# Patient Record
Sex: Male | Born: 1940 | Race: Black or African American | Hispanic: No | Marital: Married | Smoking: Former smoker
Health system: Southern US, Community
[De-identification: ages and names within clinical notes are randomized; demographics above are authoritative.]

## PROBLEM LIST (undated history)

## (undated) DIAGNOSIS — K219 Gastro-esophageal reflux disease without esophagitis: Secondary | ICD-10-CM

## (undated) DIAGNOSIS — I5041 Acute combined systolic (congestive) and diastolic (congestive) heart failure: Secondary | ICD-10-CM

## (undated) DIAGNOSIS — R55 Syncope and collapse: Secondary | ICD-10-CM

## (undated) DIAGNOSIS — E876 Hypokalemia: Secondary | ICD-10-CM

## (undated) DIAGNOSIS — I10 Essential (primary) hypertension: Secondary | ICD-10-CM

## (undated) DIAGNOSIS — N400 Enlarged prostate without lower urinary tract symptoms: Secondary | ICD-10-CM

## (undated) DIAGNOSIS — I429 Cardiomyopathy, unspecified: Secondary | ICD-10-CM

## (undated) DIAGNOSIS — I639 Cerebral infarction, unspecified: Secondary | ICD-10-CM

## (undated) DIAGNOSIS — I2699 Other pulmonary embolism without acute cor pulmonale: Secondary | ICD-10-CM

## (undated) DIAGNOSIS — M109 Gout, unspecified: Secondary | ICD-10-CM

## (undated) HISTORY — PX: HAND SURGERY: SHX662

---

## 2011-10-05 ENCOUNTER — Ambulatory Visit (INDEPENDENT_AMBULATORY_CARE_PROVIDER_SITE_OTHER): Payer: Medicare HMO | Admitting: *Deleted

## 2011-10-05 DIAGNOSIS — I779 Disorder of arteries and arterioles, unspecified: Secondary | ICD-10-CM

## 2011-10-05 DIAGNOSIS — N183 Chronic kidney disease, stage 3 unspecified: Secondary | ICD-10-CM

## 2011-10-09 NOTE — Procedures (Unsigned)
RENAL ARTERY DUPLEX EVALUATION  INDICATION:  Chronic kidney disease.  HISTORY: Increased creatinine. Diabetes:  No. Cardiac:  No. Hypertension:  No. Smoking:  Previous.  RENAL ARTERY DUPLEX FINDINGS: Aorta-Proximal:  66 cm/s Aorta-Mid:  62 cm/s Aorta-Distal:  64 cm/s Celiac Artery Origin:  117 cm/s SMA Origin:  97 cm/s                                   RIGHT               LEFT Renal Artery Origin:             80/24 cm/s          45/4 cm/s Renal Artery Proximal:           81/22 cm/s          67/22 cm/s Renal Artery Mid:                67/24 cm/s          83/26 cm/s Renal Artery Distal:             73/31 cm/s          52/19 cm/s Hilar Acceleration Time (AT): Renal-Aortic Ratio (RAR):        1.22                1.25 Kidney Size:                     10.1                [id number] End Diastolic Ratio (EDR): Resistive Index (RI):  IMPRESSION: 1. Visualized velocities in the bilateral renal arteries appear within     normal range. 2. The renal artery ratio is within normal limits bilaterally. 3. This was somewhat of a technically difficult exam due to overlying     bowel gas.  ___________________________________________ Rosetta Posner, M.D.  SS/MEDQ  D:  10/05/2011  T:  10/05/2011  Job:  [id number]

## 2015-04-04 DIAGNOSIS — I10 Essential (primary) hypertension: Secondary | ICD-10-CM | POA: Diagnosis not present

## 2015-04-04 DIAGNOSIS — Z6822 Body mass index (BMI) 22.0-22.9, adult: Secondary | ICD-10-CM | POA: Diagnosis not present

## 2015-04-04 DIAGNOSIS — Z1389 Encounter for screening for other disorder: Secondary | ICD-10-CM | POA: Diagnosis not present

## 2015-04-04 DIAGNOSIS — K219 Gastro-esophageal reflux disease without esophagitis: Secondary | ICD-10-CM | POA: Diagnosis not present

## 2015-04-04 DIAGNOSIS — N390 Urinary tract infection, site not specified: Secondary | ICD-10-CM | POA: Diagnosis not present

## 2015-04-04 DIAGNOSIS — N184 Chronic kidney disease, stage 4 (severe): Secondary | ICD-10-CM | POA: Diagnosis not present

## 2015-04-04 DIAGNOSIS — E785 Hyperlipidemia, unspecified: Secondary | ICD-10-CM | POA: Diagnosis not present

## 2015-04-04 DIAGNOSIS — Z79899 Other long term (current) drug therapy: Secondary | ICD-10-CM | POA: Diagnosis not present

## 2015-04-04 DIAGNOSIS — M109 Gout, unspecified: Secondary | ICD-10-CM | POA: Diagnosis not present

## 2015-06-16 DIAGNOSIS — R351 Nocturia: Secondary | ICD-10-CM | POA: Diagnosis not present

## 2015-06-16 DIAGNOSIS — R972 Elevated prostate specific antigen [PSA]: Secondary | ICD-10-CM | POA: Diagnosis not present

## 2015-06-16 DIAGNOSIS — N401 Enlarged prostate with lower urinary tract symptoms: Secondary | ICD-10-CM | POA: Diagnosis not present

## 2015-06-16 DIAGNOSIS — N3020 Other chronic cystitis without hematuria: Secondary | ICD-10-CM | POA: Diagnosis not present

## 2015-06-16 DIAGNOSIS — N3090 Cystitis, unspecified without hematuria: Secondary | ICD-10-CM | POA: Diagnosis not present

## 2015-06-16 DIAGNOSIS — N318 Other neuromuscular dysfunction of bladder: Secondary | ICD-10-CM | POA: Diagnosis not present

## 2015-08-10 DIAGNOSIS — I10 Essential (primary) hypertension: Secondary | ICD-10-CM | POA: Diagnosis not present

## 2015-08-10 DIAGNOSIS — R300 Dysuria: Secondary | ICD-10-CM | POA: Diagnosis not present

## 2015-08-10 DIAGNOSIS — Z681 Body mass index (BMI) 19 or less, adult: Secondary | ICD-10-CM | POA: Diagnosis not present

## 2015-08-10 DIAGNOSIS — Z79899 Other long term (current) drug therapy: Secondary | ICD-10-CM | POA: Diagnosis not present

## 2015-08-10 DIAGNOSIS — E785 Hyperlipidemia, unspecified: Secondary | ICD-10-CM | POA: Diagnosis not present

## 2015-08-10 DIAGNOSIS — R972 Elevated prostate specific antigen [PSA]: Secondary | ICD-10-CM | POA: Diagnosis not present

## 2015-08-10 DIAGNOSIS — K219 Gastro-esophageal reflux disease without esophagitis: Secondary | ICD-10-CM | POA: Diagnosis not present

## 2015-08-10 DIAGNOSIS — N184 Chronic kidney disease, stage 4 (severe): Secondary | ICD-10-CM | POA: Diagnosis not present

## 2015-10-11 DIAGNOSIS — Z9181 History of falling: Secondary | ICD-10-CM | POA: Diagnosis not present

## 2015-10-11 DIAGNOSIS — M109 Gout, unspecified: Secondary | ICD-10-CM | POA: Diagnosis not present

## 2015-10-11 DIAGNOSIS — I10 Essential (primary) hypertension: Secondary | ICD-10-CM | POA: Diagnosis not present

## 2015-11-03 DIAGNOSIS — N3090 Cystitis, unspecified without hematuria: Secondary | ICD-10-CM | POA: Diagnosis not present

## 2015-11-03 DIAGNOSIS — N318 Other neuromuscular dysfunction of bladder: Secondary | ICD-10-CM | POA: Diagnosis not present

## 2015-11-03 DIAGNOSIS — R972 Elevated prostate specific antigen [PSA]: Secondary | ICD-10-CM | POA: Diagnosis not present

## 2015-11-03 DIAGNOSIS — N401 Enlarged prostate with lower urinary tract symptoms: Secondary | ICD-10-CM | POA: Diagnosis not present

## 2015-11-03 DIAGNOSIS — N3020 Other chronic cystitis without hematuria: Secondary | ICD-10-CM | POA: Diagnosis not present

## 2015-11-17 DIAGNOSIS — N529 Male erectile dysfunction, unspecified: Secondary | ICD-10-CM | POA: Diagnosis not present

## 2015-11-17 DIAGNOSIS — N3020 Other chronic cystitis without hematuria: Secondary | ICD-10-CM | POA: Diagnosis not present

## 2015-11-17 DIAGNOSIS — R972 Elevated prostate specific antigen [PSA]: Secondary | ICD-10-CM | POA: Diagnosis not present

## 2015-11-17 DIAGNOSIS — N403 Nodular prostate with lower urinary tract symptoms: Secondary | ICD-10-CM | POA: Diagnosis not present

## 2016-01-18 DIAGNOSIS — M109 Gout, unspecified: Secondary | ICD-10-CM | POA: Diagnosis not present

## 2016-01-18 DIAGNOSIS — Z79899 Other long term (current) drug therapy: Secondary | ICD-10-CM | POA: Diagnosis not present

## 2016-01-18 DIAGNOSIS — I10 Essential (primary) hypertension: Secondary | ICD-10-CM | POA: Diagnosis not present

## 2016-01-18 DIAGNOSIS — K219 Gastro-esophageal reflux disease without esophagitis: Secondary | ICD-10-CM | POA: Diagnosis not present

## 2016-01-18 DIAGNOSIS — E785 Hyperlipidemia, unspecified: Secondary | ICD-10-CM | POA: Diagnosis not present

## 2016-01-18 DIAGNOSIS — N184 Chronic kidney disease, stage 4 (severe): Secondary | ICD-10-CM | POA: Diagnosis not present

## 2016-02-16 DIAGNOSIS — R972 Elevated prostate specific antigen [PSA]: Secondary | ICD-10-CM | POA: Diagnosis not present

## 2016-02-16 DIAGNOSIS — N318 Other neuromuscular dysfunction of bladder: Secondary | ICD-10-CM | POA: Diagnosis not present

## 2016-02-16 DIAGNOSIS — N411 Chronic prostatitis: Secondary | ICD-10-CM | POA: Diagnosis not present

## 2016-02-16 DIAGNOSIS — N3020 Other chronic cystitis without hematuria: Secondary | ICD-10-CM | POA: Diagnosis not present

## 2016-05-21 DIAGNOSIS — K219 Gastro-esophageal reflux disease without esophagitis: Secondary | ICD-10-CM | POA: Diagnosis not present

## 2016-05-21 DIAGNOSIS — Z2821 Immunization not carried out because of patient refusal: Secondary | ICD-10-CM | POA: Diagnosis not present

## 2016-05-21 DIAGNOSIS — E785 Hyperlipidemia, unspecified: Secondary | ICD-10-CM | POA: Diagnosis not present

## 2016-05-21 DIAGNOSIS — M109 Gout, unspecified: Secondary | ICD-10-CM | POA: Diagnosis not present

## 2016-05-21 DIAGNOSIS — Z6825 Body mass index (BMI) 25.0-25.9, adult: Secondary | ICD-10-CM | POA: Diagnosis not present

## 2016-05-21 DIAGNOSIS — I10 Essential (primary) hypertension: Secondary | ICD-10-CM | POA: Diagnosis not present

## 2016-05-21 DIAGNOSIS — N184 Chronic kidney disease, stage 4 (severe): Secondary | ICD-10-CM | POA: Diagnosis not present

## 2016-07-20 DIAGNOSIS — H2513 Age-related nuclear cataract, bilateral: Secondary | ICD-10-CM | POA: Diagnosis not present

## 2016-09-18 DIAGNOSIS — E785 Hyperlipidemia, unspecified: Secondary | ICD-10-CM | POA: Diagnosis not present

## 2016-09-18 DIAGNOSIS — K219 Gastro-esophageal reflux disease without esophagitis: Secondary | ICD-10-CM | POA: Diagnosis not present

## 2016-09-18 DIAGNOSIS — Z79899 Other long term (current) drug therapy: Secondary | ICD-10-CM | POA: Diagnosis not present

## 2016-09-18 DIAGNOSIS — Z6825 Body mass index (BMI) 25.0-25.9, adult: Secondary | ICD-10-CM | POA: Diagnosis not present

## 2016-09-18 DIAGNOSIS — I10 Essential (primary) hypertension: Secondary | ICD-10-CM | POA: Diagnosis not present

## 2016-09-18 DIAGNOSIS — N184 Chronic kidney disease, stage 4 (severe): Secondary | ICD-10-CM | POA: Diagnosis not present

## 2016-09-18 DIAGNOSIS — M109 Gout, unspecified: Secondary | ICD-10-CM | POA: Diagnosis not present

## 2016-10-09 DIAGNOSIS — H401133 Primary open-angle glaucoma, bilateral, severe stage: Secondary | ICD-10-CM | POA: Diagnosis not present

## 2016-10-09 DIAGNOSIS — H25812 Combined forms of age-related cataract, left eye: Secondary | ICD-10-CM | POA: Diagnosis not present

## 2016-10-10 DIAGNOSIS — H2512 Age-related nuclear cataract, left eye: Secondary | ICD-10-CM | POA: Diagnosis not present

## 2016-10-10 DIAGNOSIS — H25812 Combined forms of age-related cataract, left eye: Secondary | ICD-10-CM | POA: Diagnosis not present

## 2016-12-25 DIAGNOSIS — N3020 Other chronic cystitis without hematuria: Secondary | ICD-10-CM | POA: Diagnosis not present

## 2016-12-25 DIAGNOSIS — N400 Enlarged prostate without lower urinary tract symptoms: Secondary | ICD-10-CM | POA: Diagnosis not present

## 2016-12-25 DIAGNOSIS — R300 Dysuria: Secondary | ICD-10-CM | POA: Diagnosis not present

## 2016-12-25 DIAGNOSIS — E291 Testicular hypofunction: Secondary | ICD-10-CM | POA: Diagnosis not present

## 2016-12-25 DIAGNOSIS — N538 Other male sexual dysfunction: Secondary | ICD-10-CM | POA: Diagnosis not present

## 2016-12-25 DIAGNOSIS — R972 Elevated prostate specific antigen [PSA]: Secondary | ICD-10-CM | POA: Diagnosis not present

## 2017-01-28 DIAGNOSIS — Z79899 Other long term (current) drug therapy: Secondary | ICD-10-CM | POA: Diagnosis not present

## 2017-01-28 DIAGNOSIS — Z1389 Encounter for screening for other disorder: Secondary | ICD-10-CM | POA: Diagnosis not present

## 2017-01-28 DIAGNOSIS — Z9181 History of falling: Secondary | ICD-10-CM | POA: Diagnosis not present

## 2017-01-28 DIAGNOSIS — K219 Gastro-esophageal reflux disease without esophagitis: Secondary | ICD-10-CM | POA: Diagnosis not present

## 2017-01-28 DIAGNOSIS — Z6826 Body mass index (BMI) 26.0-26.9, adult: Secondary | ICD-10-CM | POA: Diagnosis not present

## 2017-01-28 DIAGNOSIS — M109 Gout, unspecified: Secondary | ICD-10-CM | POA: Diagnosis not present

## 2017-01-28 DIAGNOSIS — N184 Chronic kidney disease, stage 4 (severe): Secondary | ICD-10-CM | POA: Diagnosis not present

## 2017-01-28 DIAGNOSIS — E785 Hyperlipidemia, unspecified: Secondary | ICD-10-CM | POA: Diagnosis not present

## 2017-01-28 DIAGNOSIS — I10 Essential (primary) hypertension: Secondary | ICD-10-CM | POA: Diagnosis not present

## 2017-03-05 DIAGNOSIS — N318 Other neuromuscular dysfunction of bladder: Secondary | ICD-10-CM | POA: Diagnosis not present

## 2017-03-05 DIAGNOSIS — N3020 Other chronic cystitis without hematuria: Secondary | ICD-10-CM | POA: Diagnosis not present

## 2017-03-05 DIAGNOSIS — N538 Other male sexual dysfunction: Secondary | ICD-10-CM | POA: Diagnosis not present

## 2017-03-05 DIAGNOSIS — N400 Enlarged prostate without lower urinary tract symptoms: Secondary | ICD-10-CM | POA: Diagnosis not present

## 2017-05-27 DIAGNOSIS — N400 Enlarged prostate without lower urinary tract symptoms: Secondary | ICD-10-CM | POA: Diagnosis not present

## 2017-05-27 DIAGNOSIS — N3020 Other chronic cystitis without hematuria: Secondary | ICD-10-CM | POA: Diagnosis not present

## 2017-05-27 DIAGNOSIS — N318 Other neuromuscular dysfunction of bladder: Secondary | ICD-10-CM | POA: Diagnosis not present

## 2017-05-27 DIAGNOSIS — N401 Enlarged prostate with lower urinary tract symptoms: Secondary | ICD-10-CM | POA: Diagnosis not present

## 2017-05-27 DIAGNOSIS — E291 Testicular hypofunction: Secondary | ICD-10-CM | POA: Diagnosis not present

## 2017-06-03 DIAGNOSIS — Z9181 History of falling: Secondary | ICD-10-CM | POA: Diagnosis not present

## 2017-06-03 DIAGNOSIS — I10 Essential (primary) hypertension: Secondary | ICD-10-CM | POA: Diagnosis not present

## 2017-06-03 DIAGNOSIS — M109 Gout, unspecified: Secondary | ICD-10-CM | POA: Diagnosis not present

## 2017-06-03 DIAGNOSIS — E663 Overweight: Secondary | ICD-10-CM | POA: Diagnosis not present

## 2017-06-03 DIAGNOSIS — N184 Chronic kidney disease, stage 4 (severe): Secondary | ICD-10-CM | POA: Diagnosis not present

## 2017-06-03 DIAGNOSIS — K219 Gastro-esophageal reflux disease without esophagitis: Secondary | ICD-10-CM | POA: Diagnosis not present

## 2017-06-03 DIAGNOSIS — E785 Hyperlipidemia, unspecified: Secondary | ICD-10-CM | POA: Diagnosis not present

## 2017-06-03 DIAGNOSIS — Z1331 Encounter for screening for depression: Secondary | ICD-10-CM | POA: Diagnosis not present

## 2017-06-03 DIAGNOSIS — Z79899 Other long term (current) drug therapy: Secondary | ICD-10-CM | POA: Diagnosis not present

## 2017-06-04 DIAGNOSIS — N390 Urinary tract infection, site not specified: Secondary | ICD-10-CM | POA: Diagnosis not present

## 2017-06-04 DIAGNOSIS — Z79899 Other long term (current) drug therapy: Secondary | ICD-10-CM | POA: Diagnosis not present

## 2017-06-04 DIAGNOSIS — Z6827 Body mass index (BMI) 27.0-27.9, adult: Secondary | ICD-10-CM | POA: Diagnosis not present

## 2017-06-26 DIAGNOSIS — Z9181 History of falling: Secondary | ICD-10-CM | POA: Diagnosis not present

## 2017-06-26 DIAGNOSIS — Z125 Encounter for screening for malignant neoplasm of prostate: Secondary | ICD-10-CM | POA: Diagnosis not present

## 2017-06-26 DIAGNOSIS — Z0000 Encounter for general adult medical examination without abnormal findings: Secondary | ICD-10-CM | POA: Diagnosis not present

## 2017-06-26 DIAGNOSIS — E785 Hyperlipidemia, unspecified: Secondary | ICD-10-CM | POA: Diagnosis not present

## 2017-06-26 DIAGNOSIS — Z139 Encounter for screening, unspecified: Secondary | ICD-10-CM | POA: Diagnosis not present

## 2017-10-08 DIAGNOSIS — I10 Essential (primary) hypertension: Secondary | ICD-10-CM | POA: Diagnosis not present

## 2017-10-08 DIAGNOSIS — N184 Chronic kidney disease, stage 4 (severe): Secondary | ICD-10-CM | POA: Diagnosis not present

## 2017-10-08 DIAGNOSIS — N529 Male erectile dysfunction, unspecified: Secondary | ICD-10-CM | POA: Diagnosis not present

## 2017-10-08 DIAGNOSIS — Z6827 Body mass index (BMI) 27.0-27.9, adult: Secondary | ICD-10-CM | POA: Diagnosis not present

## 2017-10-08 DIAGNOSIS — E663 Overweight: Secondary | ICD-10-CM | POA: Diagnosis not present

## 2017-10-08 DIAGNOSIS — E785 Hyperlipidemia, unspecified: Secondary | ICD-10-CM | POA: Diagnosis not present

## 2017-10-08 DIAGNOSIS — Z79899 Other long term (current) drug therapy: Secondary | ICD-10-CM | POA: Diagnosis not present

## 2017-10-08 DIAGNOSIS — M109 Gout, unspecified: Secondary | ICD-10-CM | POA: Diagnosis not present

## 2017-10-08 DIAGNOSIS — K219 Gastro-esophageal reflux disease without esophagitis: Secondary | ICD-10-CM | POA: Diagnosis not present

## 2017-11-15 DIAGNOSIS — H2511 Age-related nuclear cataract, right eye: Secondary | ICD-10-CM | POA: Diagnosis not present

## 2017-11-15 DIAGNOSIS — H2702 Aphakia, left eye: Secondary | ICD-10-CM | POA: Diagnosis not present

## 2017-12-09 DIAGNOSIS — R972 Elevated prostate specific antigen [PSA]: Secondary | ICD-10-CM | POA: Diagnosis not present

## 2017-12-09 DIAGNOSIS — N538 Other male sexual dysfunction: Secondary | ICD-10-CM | POA: Diagnosis not present

## 2017-12-09 DIAGNOSIS — N3020 Other chronic cystitis without hematuria: Secondary | ICD-10-CM | POA: Diagnosis not present

## 2017-12-09 DIAGNOSIS — N401 Enlarged prostate with lower urinary tract symptoms: Secondary | ICD-10-CM | POA: Diagnosis not present

## 2018-01-24 DIAGNOSIS — J069 Acute upper respiratory infection, unspecified: Secondary | ICD-10-CM | POA: Diagnosis not present

## 2018-01-24 DIAGNOSIS — Z6826 Body mass index (BMI) 26.0-26.9, adult: Secondary | ICD-10-CM | POA: Diagnosis not present

## 2018-02-10 DIAGNOSIS — E785 Hyperlipidemia, unspecified: Secondary | ICD-10-CM | POA: Diagnosis not present

## 2018-02-10 DIAGNOSIS — K219 Gastro-esophageal reflux disease without esophagitis: Secondary | ICD-10-CM | POA: Diagnosis not present

## 2018-02-10 DIAGNOSIS — Z79899 Other long term (current) drug therapy: Secondary | ICD-10-CM | POA: Diagnosis not present

## 2018-02-10 DIAGNOSIS — Z6826 Body mass index (BMI) 26.0-26.9, adult: Secondary | ICD-10-CM | POA: Diagnosis not present

## 2018-02-10 DIAGNOSIS — M109 Gout, unspecified: Secondary | ICD-10-CM | POA: Diagnosis not present

## 2018-02-10 DIAGNOSIS — N400 Enlarged prostate without lower urinary tract symptoms: Secondary | ICD-10-CM | POA: Diagnosis not present

## 2018-02-10 DIAGNOSIS — N529 Male erectile dysfunction, unspecified: Secondary | ICD-10-CM | POA: Diagnosis not present

## 2018-02-10 DIAGNOSIS — N184 Chronic kidney disease, stage 4 (severe): Secondary | ICD-10-CM | POA: Diagnosis not present

## 2018-02-10 DIAGNOSIS — I10 Essential (primary) hypertension: Secondary | ICD-10-CM | POA: Diagnosis not present

## 2018-06-16 DIAGNOSIS — K219 Gastro-esophageal reflux disease without esophagitis: Secondary | ICD-10-CM | POA: Diagnosis not present

## 2018-06-16 DIAGNOSIS — E785 Hyperlipidemia, unspecified: Secondary | ICD-10-CM | POA: Diagnosis not present

## 2018-06-16 DIAGNOSIS — I10 Essential (primary) hypertension: Secondary | ICD-10-CM | POA: Diagnosis not present

## 2018-06-16 DIAGNOSIS — Z79899 Other long term (current) drug therapy: Secondary | ICD-10-CM | POA: Diagnosis not present

## 2018-06-16 DIAGNOSIS — N529 Male erectile dysfunction, unspecified: Secondary | ICD-10-CM | POA: Diagnosis not present

## 2018-06-16 DIAGNOSIS — E663 Overweight: Secondary | ICD-10-CM | POA: Diagnosis not present

## 2018-06-16 DIAGNOSIS — M109 Gout, unspecified: Secondary | ICD-10-CM | POA: Diagnosis not present

## 2018-06-16 DIAGNOSIS — N400 Enlarged prostate without lower urinary tract symptoms: Secondary | ICD-10-CM | POA: Diagnosis not present

## 2018-06-16 DIAGNOSIS — N184 Chronic kidney disease, stage 4 (severe): Secondary | ICD-10-CM | POA: Diagnosis not present

## 2018-07-22 DIAGNOSIS — N401 Enlarged prostate with lower urinary tract symptoms: Secondary | ICD-10-CM | POA: Diagnosis not present

## 2018-07-22 DIAGNOSIS — N3020 Other chronic cystitis without hematuria: Secondary | ICD-10-CM | POA: Diagnosis not present

## 2018-07-22 DIAGNOSIS — N318 Other neuromuscular dysfunction of bladder: Secondary | ICD-10-CM | POA: Diagnosis not present

## 2018-07-22 DIAGNOSIS — R972 Elevated prostate specific antigen [PSA]: Secondary | ICD-10-CM | POA: Diagnosis not present

## 2018-10-20 DIAGNOSIS — Z79899 Other long term (current) drug therapy: Secondary | ICD-10-CM | POA: Diagnosis not present

## 2018-10-20 DIAGNOSIS — Z9181 History of falling: Secondary | ICD-10-CM | POA: Diagnosis not present

## 2018-10-20 DIAGNOSIS — R300 Dysuria: Secondary | ICD-10-CM | POA: Diagnosis not present

## 2018-10-20 DIAGNOSIS — N184 Chronic kidney disease, stage 4 (severe): Secondary | ICD-10-CM | POA: Diagnosis not present

## 2018-10-20 DIAGNOSIS — I10 Essential (primary) hypertension: Secondary | ICD-10-CM | POA: Diagnosis not present

## 2018-10-20 DIAGNOSIS — K219 Gastro-esophageal reflux disease without esophagitis: Secondary | ICD-10-CM | POA: Diagnosis not present

## 2018-10-20 DIAGNOSIS — Z1331 Encounter for screening for depression: Secondary | ICD-10-CM | POA: Diagnosis not present

## 2018-10-20 DIAGNOSIS — M109 Gout, unspecified: Secondary | ICD-10-CM | POA: Diagnosis not present

## 2018-10-20 DIAGNOSIS — E785 Hyperlipidemia, unspecified: Secondary | ICD-10-CM | POA: Diagnosis not present

## 2018-11-27 DIAGNOSIS — Z79899 Other long term (current) drug therapy: Secondary | ICD-10-CM | POA: Diagnosis not present

## 2018-11-27 DIAGNOSIS — J069 Acute upper respiratory infection, unspecified: Secondary | ICD-10-CM | POA: Diagnosis not present

## 2018-11-27 DIAGNOSIS — Z6826 Body mass index (BMI) 26.0-26.9, adult: Secondary | ICD-10-CM | POA: Diagnosis not present

## 2018-11-27 DIAGNOSIS — R05 Cough: Secondary | ICD-10-CM | POA: Diagnosis not present

## 2018-12-07 DIAGNOSIS — J189 Pneumonia, unspecified organism: Secondary | ICD-10-CM | POA: Diagnosis not present

## 2018-12-07 DIAGNOSIS — I82412 Acute embolism and thrombosis of left femoral vein: Secondary | ICD-10-CM | POA: Diagnosis not present

## 2018-12-07 DIAGNOSIS — I959 Hypotension, unspecified: Secondary | ICD-10-CM | POA: Diagnosis not present

## 2018-12-07 DIAGNOSIS — R7989 Other specified abnormal findings of blood chemistry: Secondary | ICD-10-CM | POA: Diagnosis not present

## 2018-12-07 DIAGNOSIS — Z9181 History of falling: Secondary | ICD-10-CM | POA: Diagnosis not present

## 2018-12-07 DIAGNOSIS — R4020 Unspecified coma: Secondary | ICD-10-CM | POA: Diagnosis not present

## 2018-12-07 DIAGNOSIS — R569 Unspecified convulsions: Secondary | ICD-10-CM | POA: Diagnosis not present

## 2018-12-07 DIAGNOSIS — S0990XA Unspecified injury of head, initial encounter: Secondary | ICD-10-CM | POA: Diagnosis not present

## 2018-12-07 DIAGNOSIS — R404 Transient alteration of awareness: Secondary | ICD-10-CM | POA: Diagnosis not present

## 2018-12-07 DIAGNOSIS — R911 Solitary pulmonary nodule: Secondary | ICD-10-CM | POA: Diagnosis not present

## 2018-12-07 DIAGNOSIS — I10 Essential (primary) hypertension: Secondary | ICD-10-CM | POA: Diagnosis not present

## 2018-12-07 DIAGNOSIS — I429 Cardiomyopathy, unspecified: Secondary | ICD-10-CM | POA: Diagnosis not present

## 2018-12-07 DIAGNOSIS — E43 Unspecified severe protein-calorie malnutrition: Secondary | ICD-10-CM | POA: Diagnosis not present

## 2018-12-07 DIAGNOSIS — I361 Nonrheumatic tricuspid (valve) insufficiency: Secondary | ICD-10-CM | POA: Diagnosis not present

## 2018-12-07 DIAGNOSIS — R0902 Hypoxemia: Secondary | ICD-10-CM | POA: Diagnosis not present

## 2018-12-07 DIAGNOSIS — I5040 Unspecified combined systolic (congestive) and diastolic (congestive) heart failure: Secondary | ICD-10-CM | POA: Diagnosis not present

## 2018-12-07 DIAGNOSIS — I2699 Other pulmonary embolism without acute cor pulmonale: Secondary | ICD-10-CM | POA: Diagnosis not present

## 2018-12-07 DIAGNOSIS — R55 Syncope and collapse: Secondary | ICD-10-CM | POA: Diagnosis not present

## 2018-12-07 DIAGNOSIS — M109 Gout, unspecified: Secondary | ICD-10-CM | POA: Diagnosis not present

## 2018-12-07 DIAGNOSIS — J9811 Atelectasis: Secondary | ICD-10-CM | POA: Diagnosis not present

## 2018-12-07 DIAGNOSIS — I5041 Acute combined systolic (congestive) and diastolic (congestive) heart failure: Secondary | ICD-10-CM | POA: Diagnosis not present

## 2018-12-07 DIAGNOSIS — E876 Hypokalemia: Secondary | ICD-10-CM | POA: Diagnosis not present

## 2018-12-07 DIAGNOSIS — I517 Cardiomegaly: Secondary | ICD-10-CM | POA: Diagnosis not present

## 2018-12-07 DIAGNOSIS — R52 Pain, unspecified: Secondary | ICD-10-CM | POA: Diagnosis not present

## 2018-12-07 DIAGNOSIS — I340 Nonrheumatic mitral (valve) insufficiency: Secondary | ICD-10-CM | POA: Diagnosis not present

## 2018-12-08 DIAGNOSIS — I361 Nonrheumatic tricuspid (valve) insufficiency: Secondary | ICD-10-CM

## 2018-12-08 DIAGNOSIS — I10 Essential (primary) hypertension: Secondary | ICD-10-CM

## 2018-12-08 DIAGNOSIS — R55 Syncope and collapse: Secondary | ICD-10-CM

## 2018-12-08 DIAGNOSIS — R7989 Other specified abnormal findings of blood chemistry: Secondary | ICD-10-CM

## 2018-12-08 DIAGNOSIS — I429 Cardiomyopathy, unspecified: Secondary | ICD-10-CM

## 2018-12-08 DIAGNOSIS — I2699 Other pulmonary embolism without acute cor pulmonale: Secondary | ICD-10-CM

## 2018-12-08 DIAGNOSIS — M109 Gout, unspecified: Secondary | ICD-10-CM

## 2018-12-08 DIAGNOSIS — I5040 Unspecified combined systolic (congestive) and diastolic (congestive) heart failure: Secondary | ICD-10-CM

## 2018-12-10 ENCOUNTER — Inpatient Hospital Stay (HOSPITAL_COMMUNITY)
Admission: AD | Admit: 2018-12-10 | Discharge: 2018-12-14 | DRG: 175 | Disposition: A | Discharge status: Home Health | Payer: Medicare HMO | Source: Other Acute Inpatient Hospital | Attending: Internal Medicine | Admitting: Internal Medicine

## 2018-12-10 ENCOUNTER — Other Ambulatory Visit: Payer: Self-pay

## 2018-12-10 ENCOUNTER — Encounter (HOSPITAL_COMMUNITY): Payer: Self-pay | Admitting: Internal Medicine

## 2018-12-10 DIAGNOSIS — N400 Enlarged prostate without lower urinary tract symptoms: Secondary | ICD-10-CM | POA: Diagnosis not present

## 2018-12-10 DIAGNOSIS — E440 Moderate protein-calorie malnutrition: Secondary | ICD-10-CM | POA: Diagnosis not present

## 2018-12-10 DIAGNOSIS — L89312 Pressure ulcer of right buttock, stage 2: Secondary | ICD-10-CM | POA: Diagnosis present

## 2018-12-10 DIAGNOSIS — I5021 Acute systolic (congestive) heart failure: Secondary | ICD-10-CM | POA: Diagnosis not present

## 2018-12-10 DIAGNOSIS — I2601 Septic pulmonary embolism with acute cor pulmonale: Secondary | ICD-10-CM | POA: Diagnosis not present

## 2018-12-10 DIAGNOSIS — I248 Other forms of acute ischemic heart disease: Secondary | ICD-10-CM | POA: Diagnosis not present

## 2018-12-10 DIAGNOSIS — J9601 Acute respiratory failure with hypoxia: Secondary | ICD-10-CM | POA: Diagnosis not present

## 2018-12-10 DIAGNOSIS — M1A9XX1 Chronic gout, unspecified, with tophus (tophi): Secondary | ICD-10-CM | POA: Diagnosis not present

## 2018-12-10 DIAGNOSIS — K219 Gastro-esophageal reflux disease without esophagitis: Secondary | ICD-10-CM | POA: Diagnosis present

## 2018-12-10 DIAGNOSIS — R7989 Other specified abnormal findings of blood chemistry: Secondary | ICD-10-CM | POA: Diagnosis present

## 2018-12-10 DIAGNOSIS — I639 Cerebral infarction, unspecified: Secondary | ICD-10-CM | POA: Diagnosis present

## 2018-12-10 DIAGNOSIS — E876 Hypokalemia: Secondary | ICD-10-CM | POA: Diagnosis not present

## 2018-12-10 DIAGNOSIS — Z20828 Contact with and (suspected) exposure to other viral communicable diseases: Secondary | ICD-10-CM | POA: Diagnosis not present

## 2018-12-10 DIAGNOSIS — L8990 Pressure ulcer of unspecified site, unspecified stage: Secondary | ICD-10-CM | POA: Insufficient documentation

## 2018-12-10 DIAGNOSIS — J189 Pneumonia, unspecified organism: Secondary | ICD-10-CM | POA: Diagnosis not present

## 2018-12-10 DIAGNOSIS — I82412 Acute embolism and thrombosis of left femoral vein: Secondary | ICD-10-CM | POA: Diagnosis present

## 2018-12-10 DIAGNOSIS — I5040 Unspecified combined systolic (congestive) and diastolic (congestive) heart failure: Secondary | ICD-10-CM | POA: Diagnosis not present

## 2018-12-10 DIAGNOSIS — L89322 Pressure ulcer of left buttock, stage 2: Secondary | ICD-10-CM | POA: Diagnosis present

## 2018-12-10 DIAGNOSIS — I2729 Other secondary pulmonary hypertension: Secondary | ICD-10-CM | POA: Diagnosis not present

## 2018-12-10 DIAGNOSIS — I429 Cardiomyopathy, unspecified: Secondary | ICD-10-CM | POA: Diagnosis present

## 2018-12-10 DIAGNOSIS — I340 Nonrheumatic mitral (valve) insufficiency: Secondary | ICD-10-CM | POA: Diagnosis not present

## 2018-12-10 DIAGNOSIS — I130 Hypertensive heart and chronic kidney disease with heart failure and stage 1 through stage 4 chronic kidney disease, or unspecified chronic kidney disease: Secondary | ICD-10-CM | POA: Diagnosis present

## 2018-12-10 DIAGNOSIS — R55 Syncope and collapse: Secondary | ICD-10-CM | POA: Diagnosis not present

## 2018-12-10 DIAGNOSIS — I5082 Biventricular heart failure: Secondary | ICD-10-CM | POA: Diagnosis present

## 2018-12-10 DIAGNOSIS — M109 Gout, unspecified: Secondary | ICD-10-CM | POA: Diagnosis not present

## 2018-12-10 DIAGNOSIS — I2699 Other pulmonary embolism without acute cor pulmonale: Secondary | ICD-10-CM | POA: Diagnosis present

## 2018-12-10 DIAGNOSIS — I10 Essential (primary) hypertension: Secondary | ICD-10-CM

## 2018-12-10 DIAGNOSIS — Z8673 Personal history of transient ischemic attack (TIA), and cerebral infarction without residual deficits: Secondary | ICD-10-CM

## 2018-12-10 DIAGNOSIS — I82409 Acute embolism and thrombosis of unspecified deep veins of unspecified lower extremity: Secondary | ICD-10-CM

## 2018-12-10 DIAGNOSIS — I5043 Acute on chronic combined systolic (congestive) and diastolic (congestive) heart failure: Secondary | ICD-10-CM | POA: Diagnosis not present

## 2018-12-10 DIAGNOSIS — R778 Other specified abnormalities of plasma proteins: Secondary | ICD-10-CM | POA: Diagnosis present

## 2018-12-10 DIAGNOSIS — R0902 Hypoxemia: Secondary | ICD-10-CM | POA: Diagnosis not present

## 2018-12-10 DIAGNOSIS — I361 Nonrheumatic tricuspid (valve) insufficiency: Secondary | ICD-10-CM | POA: Diagnosis not present

## 2018-12-10 DIAGNOSIS — I5041 Acute combined systolic (congestive) and diastolic (congestive) heart failure: Secondary | ICD-10-CM | POA: Diagnosis not present

## 2018-12-10 DIAGNOSIS — I824Z2 Acute embolism and thrombosis of unspecified deep veins of left distal lower extremity: Secondary | ICD-10-CM | POA: Diagnosis not present

## 2018-12-10 DIAGNOSIS — J9811 Atelectasis: Secondary | ICD-10-CM | POA: Diagnosis not present

## 2018-12-10 DIAGNOSIS — M1A0491 Idiopathic chronic gout, unspecified hand, with tophus (tophi): Secondary | ICD-10-CM | POA: Diagnosis not present

## 2018-12-10 DIAGNOSIS — N182 Chronic kidney disease, stage 2 (mild): Secondary | ICD-10-CM | POA: Diagnosis not present

## 2018-12-10 DIAGNOSIS — Z09 Encounter for follow-up examination after completed treatment for conditions other than malignant neoplasm: Secondary | ICD-10-CM

## 2018-12-10 DIAGNOSIS — E46 Unspecified protein-calorie malnutrition: Secondary | ICD-10-CM | POA: Diagnosis not present

## 2018-12-10 DIAGNOSIS — Z87891 Personal history of nicotine dependence: Secondary | ICD-10-CM

## 2018-12-10 DIAGNOSIS — I371 Nonrheumatic pulmonary valve insufficiency: Secondary | ICD-10-CM | POA: Diagnosis not present

## 2018-12-10 DIAGNOSIS — L89302 Pressure ulcer of unspecified buttock, stage 2: Secondary | ICD-10-CM | POA: Diagnosis not present

## 2018-12-10 DIAGNOSIS — I2609 Other pulmonary embolism with acute cor pulmonale: Secondary | ICD-10-CM | POA: Diagnosis not present

## 2018-12-10 DIAGNOSIS — I959 Hypotension, unspecified: Secondary | ICD-10-CM | POA: Diagnosis not present

## 2018-12-10 DIAGNOSIS — R0602 Shortness of breath: Secondary | ICD-10-CM | POA: Diagnosis not present

## 2018-12-10 DIAGNOSIS — I824Y2 Acute embolism and thrombosis of unspecified deep veins of left proximal lower extremity: Secondary | ICD-10-CM | POA: Diagnosis not present

## 2018-12-10 DIAGNOSIS — R911 Solitary pulmonary nodule: Secondary | ICD-10-CM | POA: Diagnosis not present

## 2018-12-10 HISTORY — DX: Hypocalcemia: E83.51

## 2018-12-10 HISTORY — DX: Moderate protein-calorie malnutrition: E44.0

## 2018-12-10 HISTORY — DX: Cardiomyopathy, unspecified: I42.9

## 2018-12-10 HISTORY — DX: Essential (primary) hypertension: I10

## 2018-12-10 HISTORY — DX: Gout, unspecified: M10.9

## 2018-12-10 HISTORY — DX: Benign prostatic hyperplasia without lower urinary tract symptoms: N40.0

## 2018-12-10 HISTORY — DX: Syncope and collapse: R55

## 2018-12-10 HISTORY — DX: Cerebral infarction, unspecified: I63.9

## 2018-12-10 HISTORY — DX: Acute combined systolic (congestive) and diastolic (congestive) heart failure: I50.41

## 2018-12-10 HISTORY — DX: Acute embolism and thrombosis of unspecified deep veins of unspecified lower extremity: I82.409

## 2018-12-10 HISTORY — DX: Other specified abnormalities of plasma proteins: R77.8

## 2018-12-10 HISTORY — DX: Hypokalemia: E87.6

## 2018-12-10 HISTORY — DX: Other specified abnormal findings of blood chemistry: R79.89

## 2018-12-10 HISTORY — DX: Gastro-esophageal reflux disease without esophagitis: K21.9

## 2018-12-10 HISTORY — DX: Other pulmonary embolism without acute cor pulmonale: I26.99

## 2018-12-10 MED ORDER — TAMSULOSIN HCL 0.4 MG PO CAPS
0.4000 mg | ORAL_CAPSULE | Freq: Every day | ORAL | Status: DC
  Administered 2018-12-10 – 2018-12-14 (×5): 0.4 mg via ORAL
  Filled 2018-12-10 (×5): qty 1

## 2018-12-10 MED ORDER — ORAL CARE MOUTH RINSE
15.0000 mL | Freq: Two times a day (BID) | OROMUCOSAL | Status: DC
  Administered 2018-12-10 – 2018-12-13 (×5): 15 mL via OROMUCOSAL

## 2018-12-10 MED ORDER — SODIUM CHLORIDE 0.9 % IV SOLN: 250.0000 mL | INTRAVENOUS | Status: DC | PRN

## 2018-12-10 MED ORDER — SODIUM CHLORIDE 0.9% FLUSH: 3.0000 mL | INTRAVENOUS | Status: DC | PRN

## 2018-12-10 MED ORDER — DM-GUAIFENESIN ER 30-600 MG PO TB12: 1.0000 | ORAL_TABLET | Freq: Two times a day (BID) | ORAL | Status: DC | PRN

## 2018-12-10 MED ORDER — SODIUM CHLORIDE 0.9% FLUSH
3.0000 mL | Freq: Two times a day (BID) | INTRAVENOUS | Status: DC
  Administered 2018-12-11 – 2018-12-13 (×7): 3 mL via INTRAVENOUS

## 2018-12-10 MED ORDER — SODIUM CHLORIDE 0.9 % IV SOLN
1.0000 g | INTRAVENOUS | Status: DC
  Filled 2018-12-10: qty 10

## 2018-12-10 MED ORDER — FINASTERIDE 5 MG PO TABS
5.0000 mg | ORAL_TABLET | Freq: Every day | ORAL | Status: DC
  Administered 2018-12-11 – 2018-12-14 (×4): 5 mg via ORAL
  Filled 2018-12-10 (×4): qty 1

## 2018-12-10 MED ORDER — HYDROCODONE-ACETAMINOPHEN 5-325 MG PO TABS
1.0000 | ORAL_TABLET | Freq: Four times a day (QID) | ORAL | Status: DC | PRN
  Administered 2018-12-10: 1 via ORAL
  Filled 2018-12-10: qty 1

## 2018-12-10 MED ORDER — PANTOPRAZOLE SODIUM 40 MG PO TBEC
40.0000 mg | DELAYED_RELEASE_TABLET | Freq: Every day | ORAL | Status: DC
  Administered 2018-12-10 – 2018-12-14 (×5): 40 mg via ORAL
  Filled 2018-12-10 (×5): qty 1

## 2018-12-10 MED ORDER — ACETAMINOPHEN 325 MG PO TABS
650.0000 mg | ORAL_TABLET | Freq: Four times a day (QID) | ORAL | Status: DC | PRN
  Administered 2018-12-13: 650 mg via ORAL
  Filled 2018-12-10 (×2): qty 2

## 2018-12-10 MED ORDER — ENSURE ENLIVE PO LIQD
237.0000 mL | Freq: Two times a day (BID) | ORAL | Status: DC
  Administered 2018-12-11 – 2018-12-14 (×5): 237 mL via ORAL

## 2018-12-10 MED ORDER — SODIUM CHLORIDE 0.9 % IV SOLN
500.0000 mg | INTRAVENOUS | Status: DC
  Administered 2018-12-11: 02:00:00 500 mg via INTRAVENOUS
  Filled 2018-12-10 (×4): qty 500

## 2018-12-10 MED ORDER — ONDANSETRON HCL 4 MG/2ML IJ SOLN: 4.0000 mg | Freq: Four times a day (QID) | INTRAMUSCULAR | Status: DC | PRN

## 2018-12-10 MED ORDER — COLCHICINE 0.6 MG PO TABS
0.6000 mg | ORAL_TABLET | Freq: Every day | ORAL | Status: DC
  Administered 2018-12-11 – 2018-12-14 (×4): 0.6 mg via ORAL
  Filled 2018-12-10 (×4): qty 1

## 2018-12-10 MED ORDER — HYDRALAZINE HCL 20 MG/ML IJ SOLN
5.0000 mg | INTRAMUSCULAR | Status: DC | PRN
  Administered 2018-12-12 (×2): 5 mg via INTRAVENOUS
  Filled 2018-12-10 (×2): qty 1

## 2018-12-10 MED ORDER — SODIUM CHLORIDE 0.9 % IV SOLN
1.0000 g | Freq: Once | INTRAVENOUS | Status: AC
  Administered 2018-12-11: 1 g via INTRAVENOUS
  Filled 2018-12-10: qty 1
  Filled 2018-12-10: qty 10

## 2018-12-10 MED ORDER — APIXABAN 5 MG PO TABS
10.0000 mg | ORAL_TABLET | Freq: Two times a day (BID) | ORAL | Status: DC
  Administered 2018-12-10 – 2018-12-11 (×2): 10 mg via ORAL
  Filled 2018-12-10 (×2): qty 2

## 2018-12-10 MED ORDER — ALBUTEROL SULFATE (2.5 MG/3ML) 0.083% IN NEBU: 3.0000 mL | INHALATION_SOLUTION | RESPIRATORY_TRACT | Status: DC | PRN

## 2018-12-10 MED ORDER — LISINOPRIL 10 MG PO TABS
10.0000 mg | ORAL_TABLET | Freq: Every day | ORAL | Status: DC
  Administered 2018-12-11: 10 mg via ORAL
  Filled 2018-12-10: qty 1

## 2018-12-10 NOTE — Progress Notes (Signed)
ANTICOAGULATION CONSULT NOTE - Initial Consult  Pharmacy Consult for eliquis Indication: PE, DVT  No Known Allergies  Patient Measurements: Height: 6' (182.9 cm) Weight: 191 lb 9.3 oz (86.9 kg) IBW/kg (Calculated) : 77.6   Vital Signs: Temp: 98.5 F (36.9 C) (08/05 2145) Temp Source: Oral (08/05 2145) BP: 163/91 (08/05 2145) Pulse Rate: 79 (08/05 2145)  Labs: No results for input(s): HGB, HCT, PLT, APTT, LABPROT, INR, HEPARINUNFRC, HEPRLOWMOCWT, CREATININE, CKTOTAL, CKMB, TROPONINIHS in the last 72 hours.  CrCl cannot be calculated (No successful lab value found.).   Medical History: Past Medical History:  Diagnosis Date  . Acute combined systolic and diastolic heart failure ([place])   . BPH (benign prostatic hyperplasia)   . Cardiomyopathy ([place])   . GERD (gastroesophageal reflux disease)   . Gout   . HTN (hypertension)   . Hypocalcemia   . Hypokalemia   . Pulmonary embolism ([place])   . Stroke (cerebrum) ([place])   . Syncope     Medications:  See medication history  Assessment: 78 yo man transferred from [place] to continue eliquis for PE/DVT. Goal of Therapy:  Therapeutic anticoagulation Monitor platelets by anticoagulation protocol: Yes   Plan:  Continue eliquis 10 mg po bid.  F/u start of therapy at [facility] to determine duration of this dose Monitor for bleeding complications   Thanks for allowing pharmacy to be a part of this patient's care.  Excell Seltzer, PharmD Clinical Pharmacist  12/10/2018,11:33 PM

## 2018-12-10 NOTE — H&P (Signed)
History and Physical    Benjamin Woods QPY:[id number] DOB: 11/04/1940 DOA: 12/10/2018  Referring MD/NP/PA:   PCP: Helen Hashimoto., MD   Patient coming from:  The patient is coming from home.  At baseline, pt is independent for most of ADL.        Chief Complaint: syncope  HPI: Benjamin Woods is a 78 y.o. male with medical history significant of prior left frontal stroke, hypertension, gout, GERD, BPH, who was initially admitted to [facility] due to syncope.  Patient is transferred from [facility]  Pt was initially admitted to [facility] due to syncope and found to have bilateral pulmonary embolism and DVT on 8/2. CT chest with contrast shows bilateral central pulmonary emboli with no evidence of right heart strain. Tele neurologist was consulted who recommended admitting patient to rule out stroke/seizures.  MRI of the brain obtained and revealed no acute infarcts old left anterior frontal lobe infarcts seen. EEG with no evidence of seizure activity. Echocardiogram obtained which showed new combined systolic and diastolic congestive heart failure with an EF of 30-35%. Pt also had elevated trop. Pt was treated with IV heparin and then switched to Eliquis. SARS-CoV-2 RNA was negative. Chest x-ray showed 8 mm nodular opacity in left lower lobe and bibasilar atelectasis. Pt was started with Rocephin and azithromycin for possible pneumonia.   During stay in hospital, pt developed transient hypotension with SBP as low as the 60s that responded to IV fluid and short-term usage of Levophed infusion. Subsequently patient redeveloped baseline hypertension. It is suspected patient may have thrown additional pulmonary emboli based on these findings. Of note an additional limited echo has been obtained by the cardiologists which now shows increase RVSP consistent with overload, worsening of RV systolic function which is now severely impaired, moderate enlargement of the right atrium, moderate TR, moderate  pulmonary hypertension. Right atrial pressure 8 cm. Given these worrisome findings cardiology recommended transfer to tertiary facility for closer monitoring given risk for rapid decompensation with associated severe LV dysfunction.   At arrival to floor today, his Bp is 163/91.  Patient denies chest pain, shortness of breath, cough, fever, chills.  No nausea, vomiting, diarrhea, abdominal pain, symptoms of UTI or unilateral weakness.  He has bilateral hand pain due to chronic gout with finger deformity.   Lab on 8/5 showed WBC 6.1, potassium 4.2, creatinine 1.4, BUN 35.  Currently temperature normal, oxygen sat 100% on room air, no tachycardia, RR 18.  Patient is admitted to stepdown as inpatient.  Review of Systems:   General: no fevers, chills, no body weight gain, has fatigue HEENT: no blurry vision, hearing changes or sore throat Respiratory: no dyspnea, coughing, wheezing CV: no chest pain, no palpitations GI: no nausea, vomiting, abdominal pain, diarrhea, constipation GU: no dysuria, burning on urination, increased urinary frequency, hematuria  Ext: no leg edema Neuro: no unilateral weakness, numbness, or tingling, no vision change or hearing loss Skin: no rash, no skin tear. MSK: No muscle spasm, no deformity, no limitation of range of movement in spin. Has finger deformities in both hands. Heme: No easy bruising.  Travel history: No recent long distant travel.  Allergy: No Known Allergies  Past Medical History:  Diagnosis Date   Acute combined systolic and diastolic heart failure (HCC)    BPH (benign prostatic hyperplasia)    Cardiomyopathy (HCC)    GERD (gastroesophageal reflux disease)    Gout    HTN (hypertension)    Hypocalcemia    Hypokalemia  Pulmonary embolism (HCC)    Stroke (cerebrum) ([place])    Syncope     Past Surgical History:  Procedure Laterality Date   HAND SURGERY      Social History:  reports that he has quit smoking. He has never used  smokeless tobacco. He reports previous alcohol use. He reports that he does not use drugs.  Family History: Reviewed with patient, but patient does not know any family medical history in detail.   Prior to Admission medications   Not on File    Physical Exam: Vitals:   12/10/18 2145  BP: (!) 163/91  Pulse: 79  Resp: 18  Temp: 98.5 F (36.9 C)  TempSrc: Oral  SpO2: 100%  Weight: 86.9 kg  Height: 6' (1.829 m)   General: Not in acute distress HEENT:       Eyes: PERRL, EOMI, no scleral icterus.       ENT: No discharge from the ears and nose, no pharynx injection, no tonsillar enlargement.        Neck: No JVD, no bruit, no mass felt. Heme: No neck lymph node enlargement. Cardiac: S1/S2, RRR, No murmurs, No gallops or rubs. Respiratory: No rales, wheezing, rhonchi or rubs. GI: Soft, nondistended, nontender, no rebound pain, no organomegaly, BS present. GU: No hematuria Ext: No pitting leg edema bilaterally. 2+DP/PT pulse bilaterally. Musculoskeletal: No joint deformities, No joint redness or warmth, no limitation of ROM in spin. Skin: No rashes.  Neuro: Alert, oriented X3, cranial nerves II-XII grossly intact, moves all extremities normally.  Has finger deformity in both hands. Psych: Patient is not psychotic, no suicidal or hemocidal ideation.  Labs on Admission: I have personally reviewed following labs and imaging studies  CBC: No results for input(s): WBC, NEUTROABS, HGB, HCT, MCV, PLT in the last 168 hours. Basic Metabolic Panel: No results for input(s): NA, K, CL, CO2, GLUCOSE, BUN, CREATININE, CALCIUM, MG, PHOS in the last 168 hours. GFR: CrCl cannot be calculated (No successful lab value found.). Liver Function Tests: No results for input(s): AST, ALT, ALKPHOS, BILITOT, PROT, ALBUMIN in the last 168 hours. No results for input(s): LIPASE, AMYLASE in the last 168 hours. No results for input(s): AMMONIA in the last 168 hours. Coagulation Profile: No results for  input(s): INR, PROTIME in the last 168 hours. Cardiac Enzymes: No results for input(s): CKTOTAL, CKMB, CKMBINDEX, TROPONINI in the last 168 hours. BNP (last 3 results) No results for input(s): PROBNP in the last 8760 hours. HbA1C: No results for input(s): HGBA1C in the last 72 hours. CBG: No results for input(s): GLUCAP in the last 168 hours. Lipid Profile: No results for input(s): CHOL, HDL, LDLCALC, TRIG, CHOLHDL, LDLDIRECT in the last 72 hours. Thyroid Function Tests: No results for input(s): TSH, T4TOTAL, FREET4, T3FREE, THYROIDAB in the last 72 hours. Anemia Panel: No results for input(s): VITAMINB12, FOLATE, FERRITIN, TIBC, IRON, RETICCTPCT in the last 72 hours. Urine analysis: No results found for: COLORURINE, APPEARANCEUR, LABSPEC, PHURINE, GLUCOSEU, HGBUR, BILIRUBINUR, KETONESUR, PROTEINUR, UROBILINOGEN, NITRITE, LEUKOCYTESUR Sepsis Labs: @LABRCNTIP (procalcitonin:4,lacticidven:4) )No results found for this or any previous visit (from the past 240 hour(s)).   Radiological Exams on Admission: No results found.   EKG: will get one.   Assessment/Plan Principal Problem:   Pulmonary embolism (HCC) Active Problems:   Stroke (cerebrum) (HCC)   HTN (hypertension)   Gout   BPH (benign prostatic hyperplasia)   Syncope   Elevated troponin   Protein-calorie malnutrition, moderate (HCC)   Acute combined systolic and diastolic congestive heart failure (  [place])   DVT (deep venous thrombosis) (HCC)   Principal Problem:   Pulmonary embolism (HCC) Active Problems:   Stroke (cerebrum) (HCC)   HTN (hypertension)   Gout   BPH (benign prostatic hyperplasia)   Syncope   Elevated troponin   Protein-calorie malnutrition, moderate (HCC)   Acute combined systolic and diastolic congestive heart failure (HCC)   DVT (deep venous thrombosis) (HCC)   Syncope: most likely due to bilateral pulmonary embolism. MRI of the brain obtained and revealed no acute infarcts old left anterior  frontal lobe infarcts seen. EEG with no evidence of seizure activity. Echocardiogram obtained and consistent with new combined systolic and diastolic congestive heart failure with an EF of 30-35%.  -will admit to SDU as inpt -Frequent neuro check -PT/OT  New combined systolic and diastolic congestive heart failure: Echocardiogram which revealed new combined systolic and diastolic congestive heart failure. EF estimated at 30-35%.  Patient does not have chest pain or shortness of breath currently.  No leg edema.  CHF seem to be compensated at this moment. Patient will need further workup etiology of new CHF -on lisinopril -Daily weights with strict I/O, sodium restricted diet, -closely monitor renal function and electrolytes -card consult is requested via Epic -check BNP  Biilateral central pulmonary emboli with evolving right heart strain CT chest with contrast shows bilateral central pulmonary emboli with no evidence of right heart strain. Echocardiogram from 08/03 revealed mild enlargement of right ventricle and mild TR with RVSP 31 mmHg. After overnight events and 8/5 limited echocardiogram completed by echocardiogram which shows worsening right-side cardiovascular function: RV is now severely enlarged with severe impairment of RV systolic function, right atrium is moderately enlarged, tricuspid regurg has worsened to moderate with associated moderate pulmonary hypertension with right atrial pressure of 8 mmHg.  -continue Eliquis -f/u card recommendations.  Left lower extremity DVT:-lower extremity venous duplex revealed nonocclusive DVT of left CFV and proximal left femoral vein. DVT not significant enough to qualify for local thrombolysis -on Eliquis  Elevated troponin: Troponin 0.24> 0.58 > 0.62 > 0.52 > 0.49; this is possibly due to demand ischemia 2/2 to PE and CHF. Per discharge summary, "initial EKG showed sinus rhythm at 96bpm with occasional PVCs and minimal voltage criteria for LVH.  ST and T-wave abnormality with consideration for inferiolateral ischemia noted. Subsequent EKG showed normal sinus rhythm at rate of 97bpm with minimal voltage criteria for LVH and ST and T-wave abnormality with consideration for inferiolateral ischemia noted".  Currently patient does not have any chest pain. -Continue telemetry -f/u cardiac recommendations   Protein calorie malnutrition-moderate: -Nutrition supplement, Ensure  Questionable unknown infectious process (? CAP POA): Chest x-ray showed 8 mm nodular opacity in left lower lobe and bibasilar atelectasis. CT chest with contrast shows bilateral central pulmonary emboli with no evidence of right heart strain. Procalcitonin was elevated at 1.56. Patient was empirically started on IV ceftriaxone and azithromycin. Blood culture NGTD -will continue IV ceftriaxone and azithromycin. -repeat Bx. -May d/c Abx if pt does not develop fever or hypoxia.  Hypertension: Bp 160/91. Pt was on 40 mg daily of lisinopril and valsartan-HCTZ at home.  Since patient had hypotension.  Will start low-dose of lisinopril today. -Lisinopril 10 mg daily -IV hydralazine as needed.  Gout -Continue colchicine and prn Norco  BPH -Continue Flomax and finasteride    Inpatient status:  # Patient requires inpatient status due to high intensity of service, high risk for further deterioration and high frequency of surveillance required.  I certify  that at the point of admission it is my clinical judgment that the patient will require inpatient hospital care spanning beyond 2 midnights from the point of admission.   This patient has multiple chronic comorbidities including prior left frontal stroke, hypertension, gout, GERD, BPH, who was initially admitted to [facility] due to syncope.  Now patient has presenting with syncope, acute bilateral PE, DVT, acute new onset combined CHF with EF of 30%  The initial radiographic and laboratory data are worrisome because of  bilateral PE, left leg DVT, acute CHF with EF of 30%, and right heart ventricular dysfunction.  Current medical needs: please see my assessment and plan  Predictability of an adverse outcome (risk): Patient has multiple complaints, now presents with syncope, acute bilateral PE, DVT, acute new onset combined CHF with EF of 30%, and right heart ventricular dysfunction. Pt will need further work-up for heart failure.  His presentation is highly complicated.  Patient is at high risk for deteriorating.  Will need to be treated in hospital for at least 2 days.     DVT ppx: on Eliquis Code Status: Full code Family Communication: None at bed side.  Disposition Plan:  Anticipate discharge back to previous home environment Consults called:  none Admission status:  SDU/inpation       Date of Service 12/11/2018    [facility]   If 7PM-7AM, please contact night-coverage [email or web address] Password [email or web address] 12/11/2018, 1:25 AM

## 2018-12-11 ENCOUNTER — Encounter (HOSPITAL_COMMUNITY): Payer: Self-pay | Admitting: Physician Assistant

## 2018-12-11 ENCOUNTER — Inpatient Hospital Stay (HOSPITAL_COMMUNITY): Payer: Medicare HMO

## 2018-12-11 DIAGNOSIS — I2609 Other pulmonary embolism with acute cor pulmonale: Secondary | ICD-10-CM

## 2018-12-11 DIAGNOSIS — I361 Nonrheumatic tricuspid (valve) insufficiency: Secondary | ICD-10-CM

## 2018-12-11 DIAGNOSIS — L8990 Pressure ulcer of unspecified site, unspecified stage: Secondary | ICD-10-CM | POA: Insufficient documentation

## 2018-12-11 DIAGNOSIS — I371 Nonrheumatic pulmonary valve insufficiency: Secondary | ICD-10-CM

## 2018-12-11 HISTORY — DX: Pressure ulcer of unspecified site, unspecified stage: L89.90

## 2018-12-11 LAB — TROPONIN I (HIGH SENSITIVITY)
Troponin I (High Sensitivity): 75 ng/L — ABNORMAL HIGH (ref <18)
Troponin I (High Sensitivity): 79 ng/L — ABNORMAL HIGH (ref <18)

## 2018-12-11 LAB — BASIC METABOLIC PANEL
Anion gap: 10 (ref 5–15)
BUN: 28 mg/dL — ABNORMAL HIGH (ref 8–23)
CO2: 21 mmol/L — ABNORMAL LOW (ref 22–32)
Calcium: 7.8 mg/dL — ABNORMAL LOW (ref 8.9–10.3)
Chloride: 103 mmol/L (ref 98–111)
Creatinine, Ser: 1.18 mg/dL (ref 0.61–1.24)
GFR calc Af Amer: >60 mL/min (ref >60)
GFR calc non Af Amer: 59 mL/min — ABNORMAL LOW (ref >60)
Glucose, Bld: 82 mg/dL (ref 70–99)
Potassium: 3.9 mmol/L (ref 3.5–5.1)
Sodium: 134 mmol/L — ABNORMAL LOW (ref 135–145)

## 2018-12-11 LAB — ECHOCARDIOGRAM COMPLETE
Height: 72 in
Weight: 3065.28 oz

## 2018-12-11 LAB — BRAIN NATRIURETIC PEPTIDE: B Natriuretic Peptide: 1997.9 pg/mL — ABNORMAL HIGH (ref 0.0–100.0)

## 2018-12-11 MED ORDER — APIXABAN 5 MG PO TABS
10.0000 mg | ORAL_TABLET | Freq: Two times a day (BID) | ORAL | Status: DC
  Administered 2018-12-11 – 2018-12-14 (×6): 10 mg via ORAL
  Filled 2018-12-11 (×5): qty 2

## 2018-12-11 MED ORDER — APIXABAN 5 MG PO TABS: 5.0000 mg | ORAL_TABLET | Freq: Two times a day (BID) | ORAL | Status: DC

## 2018-12-11 NOTE — Evaluation (Signed)
Physical Therapy Evaluation Patient Details Name: Benjamin Woods MRN: [id number] DOB: 1940-09-19 Today's Date: 12/11/2018   History of Present Illness  78 y.o. male with a hx of HTN, Stroke, GERD who transferred from [facility] with bilateral PE and cardiomyopathy who is being seen today for the evaluation of CHF at the request of Dr. Blaine Hamper.   Clinical Impression  PTA pt independent in ambulation, driving to doctor's appointments and independent in iADLs. Pt is currently limited in safe mobility by decreased strength, balance and endurance. Pt is currently minA for all mobility and requires RW for transfers and ambulation. PT optimistic pt will be able to discharge with HHPT with increased mobility. PT recommending pt work with mobility tech to improve strength and endurance. PT will continue to follow acutely.     Follow Up Recommendations Home health PT;Supervision for mobility/OOB    Equipment Recommendations  Rolling walker with 5" wheels;3in1 (PT)    Recommendations for Other Services       Precautions / Restrictions Precautions Precautions: Fall      Mobility  Bed Mobility Overal bed mobility: Needs Assistance Bed Mobility: Supine to Sit     Supine to sit: Min assist;HOB elevated     General bed mobility comments: OOB in recliner on entry   Transfers Overall transfer level: Needs assistance Equipment used: Rolling walker (2 wheeled) Transfers: Sit to/from Stand Sit to Stand: Min assist;From elevated surface         General transfer comment: minA for power up and steadying at RW, able to push up from armrests steady himself with minA and then reach up to RW  Ambulation/Gait Ambulation/Gait assistance: Min assist Gait Distance (Feet): 25 Feet Assistive device: Rolling walker (2 wheeled) Gait Pattern/deviations: Step-through pattern;Decreased step length - right;Decreased step length - left;Trunk flexed;Shuffle Gait velocity: slowed Gait velocity  interpretation: <1.8 ft/sec, indicate of risk for recurrent falls General Gait Details: minA for slow, mildly unsteady crouched gait due to decreased hip and knee ROM, shuffling gait with no overt LoB        Balance Overall balance assessment: Needs assistance Sitting-balance support: Bilateral upper extremity supported;Feet supported Sitting balance-Leahy Scale: Fair     Standing balance support: Bilateral upper extremity supported Standing balance-Leahy Scale: Poor Standing balance comment: pt needing external support of rw + min A from therapist                             Pertinent Vitals/Pain Pain Assessment: No/denies pain    Home Living Family/patient expects to be discharged to:: Private residence Living Arrangements: Spouse/significant other Available Help at Discharge: Family;Available PRN/intermittently;Available 24 hours/day Type of Home: House Home Access: Level entry     Home Layout: Able to live on main level with bedroom/bathroom Home Equipment: Shower seat Additional Comments: spouse can provide setup assist,     Prior Function Level of Independence: Independent with assistive device(s)         Comments: pt reports he does not need physcial assist with ADLs. Limited mobility, does walk to/from bathroom. bathes and dresses himself per his report and confirmed by daughter, Karna Christmas via phone     Hand Dominance   Dominant Hand: Right    Extremity/Trunk Assessment   Upper Extremity Assessment Upper Extremity Assessment: Defer to OT evaluation RUE Deficits / Details: AROM shoulder flexion to about 90*, arthritis throughout UE LUE Deficits / Details: AROM shoulder flexion to about 90*, arthritis throughout UE  Lower Extremity Assessment Lower Extremity Assessment: RLE deficits/detail;LLE deficits/detail;Generalized weakness RLE Deficits / Details: arthritis limiting ROM, knee lacking 10 degrees extension and flexion, ankle ROM WFL, strength  grossly assessed at 3/5  RLE Coordination: decreased fine motor;decreased gross motor LLE Deficits / Details: Arthritis limiting ROM, R hip in ER unable to attain neutral, knee lacking 10 ext/flex, strength grossly assessed at 3+/5 LLE Coordination: decreased fine motor;decreased gross motor       Communication   Communication: No difficulties  Cognition Arousal/Alertness: Awake/alert Behavior During Therapy: Flat affect;WFL for tasks assessed/performed Overall Cognitive Status: No family/caregiver present to determine baseline cognitive functioning                                 General Comments: slow processing, some deayed reponses      General Comments General comments (skin integrity, edema, etc.): Pt ambulated on RA with SaO2 ranging from 94-100%O2.      Assessment/Plan    PT Assessment Patient needs continued PT services  PT Problem List Decreased strength;Decreased range of motion;Decreased activity tolerance;Decreased balance;Decreased mobility;Decreased coordination;Decreased cognition       PT Treatment Interventions DME instruction;Gait training;Functional mobility training;Therapeutic activities;Therapeutic exercise;Balance training;Cognitive remediation;Patient/family education    PT Goals (Current goals can be found in the Care Plan section)  Acute Rehab PT Goals Patient Stated Goal: get stronger PT Goal Formulation: With patient Time For Goal Achievement: 12/25/18 Potential to Achieve Goals: Fair    Frequency Min 3X/week   Barriers to discharge        Co-evaluation               AM-PAC PT "6 Clicks" Mobility  Outcome Measure Help needed turning from your back to your side while in a flat bed without using bedrails?: A Little Help needed moving from lying on your back to sitting on the side of a flat bed without using bedrails?: A Little Help needed moving to and from a bed to a chair (including a wheelchair)?: A Little Help  needed standing up from a chair using your arms (e.g., wheelchair or bedside chair)?: A Little Help needed to walk in hospital room?: A Little Help needed climbing 3-5 steps with a railing? : Total 6 Click Score: 16    End of Session Equipment Utilized During Treatment: Gait belt Activity Tolerance: Patient tolerated treatment well Patient left: in chair;with call bell/phone within reach;with chair alarm set Nurse Communication: Mobility status PT Visit Diagnosis: Unsteadiness on feet (R26.81);Other abnormalities of gait and mobility (R26.89);Muscle weakness (generalized) (M62.81);Difficulty in walking, not elsewhere classified (R26.2)    Time: 1105-1130 PT Time Calculation (min) (ACUTE ONLY): 25 min   Charges:   PT Evaluation $PT Eval Moderate Complexity: 1 Mod PT Treatments $Gait Training: 8-22 mins        Kaitlynne Wenz B. Migdalia Dk PT, DPT Acute Rehabilitation Services Pager [phone] Office [phone]   [place] 12/11/2018, 11:50 AM

## 2018-12-11 NOTE — Consult Note (Addendum)
NAME:  Benjamin Woods, MRN:  [id number], DOB:  [birth year], LOS: 1 ADMISSION DATE:  12/10/2018, CONSULTATION DATE:  12/11/2018 REFERRING MD:  Tawanna Solo - [facility], CHIEF COMPLAINT:  Submassive PE    Brief History   78 yo M with submassive PE, going to IR for directed lysis and will transfer to ICU following procedure.    History of present illness   78 yo M PMH prior CVA, HTN, GERD, gout, BPH, who initially presented to [facility] 8/2 for syncope and was transferred to [facility] 8/6. On 8/2 patient found to have bilateral Pulmonary Embolism and DVT, without evidence of right heart strain on CT. CXR revealed 18mm LLL nodular opacity and atelectasis. Rocephin and Azithromycin was started for possible PNA. SARS CoV2 was negative.   MRI brain without acute infarct, EEG without seizure activity. ECHO with new combined systolic and diastolic CHF, LVEF [id number]. Patient was started on heparin gtt, later transitioned to eliquis. Patient developed hypotension, SBP 60s, which was fluid responsivebut did require short-term NE gtt. This heightened concern for additional PE. Subsequent ECHO with worsening RV systolic function, moderate RA enlargement, increase RVSP. Decision was made to transfer patient to [facility] for closer monitoring and further care. Patient transferred 12/11/2018.  At [facility], patient admitted to [facility] 8/6 and seen by cardiology on 8/7, and by IR 8/7. IR planning to take patient for directed lysis after which time patient will be transferred to ICU for monitoring and care.    Past Medical History   HTN, GERD, Gout, CVA, BPH, PE, Acute systolic and diastolic CHF    Significant Hospital Events    8/2 presented to [facility], started on heparin gtt for PE. Transitioned to eliquis. Acutely decompensated.  8/6 Transferred to [facility]    Consults:   Cardiology IR PCCM   Procedures:      Significant Diagnostic Tests:    LE [place] 8/6: >>> Repeat echo 8/6>> Micro Data:  8/6 BCx >>     Antimicrobials:  8/6 Azithromycin >> 8/6 Ceftriaxone >>    Interim history/subjective:  Will be transferred to ICU after IR   Objective   Blood pressure 117/73, pulse 74, temperature (Abnormal) 97.5 F (36.4 C), temperature source Oral, resp. rate 18, height 6' (1.829 m), weight 86.9 kg, SpO2 100 %.        Intake/Output Summary (Last 24 hours) at 12/11/2018 1410 Last data filed at 12/11/2018 1000 Gross per 24 hour  Intake 240 ml  Output 250 ml  Net -10 ml   Filed Weights   12/10/18 2145 12/11/18 0611  Weight: 86.9 kg 86.9 kg    Examination: General: pleasant 78 year old black male resting in bed HENT: ncat NO jvd mmm Lungs: Clear decreased bases  Cardiovascular: RRR no MRG Abdomen: soft not tender + bowel sounds  Extremities: LE chronic venous changes  Neuro: awake and oriented  GU: voids   Resolved Hospital Problem list     Assessment & Plan:  Submassive PE Acute DVT Biventricular HF Protein calorie malnutrition  Gout BPH   Submassive bilateral pulmonary emboli and DVT  -only provoking factor currently identifiable is sedentary life style -follow up echo by our cardiology department showed biventricular dysfxn w severely reduced RV fxn so IR consulted BUT currently he is hemodynamically stable and on no supplemental oxygen.   Plan Admit to ICU Cont systemic ac Would hold off on catheterr directed therapy for now.  He does have fairly large  DVT burden per report from [place]. Might  consider retractable IVC filter to decrease risk should he have further clot.  Will repeat LE [place] ane echo Will move to the ICU for closer monitoring over night       Labs   CBC: No results for input(s): WBC, NEUTROABS, HGB, HCT, MCV, PLT in the last 168 hours.  Basic Metabolic Panel: Recent Labs  Lab 12/11/18 0405  NA 134*  K 3.9  CL 103  CO2 21*  GLUCOSE 82  BUN 28*  CREATININE 1.18  CALCIUM 7.8*   GFR: Estimated Creatinine Clearance: 56.6 mL/min (by C-G  formula based on SCr of 1.18 mg/dL). No results for input(s): PROCALCITON, WBC, LATICACIDVEN in the last 168 hours.  Liver Function Tests: No results for input(s): AST, ALT, ALKPHOS, BILITOT, PROT, ALBUMIN in the last 168 hours. No results for input(s): LIPASE, AMYLASE in the last 168 hours. No results for input(s): AMMONIA in the last 168 hours.  ABG No results found for: PHART, PCO2ART, PO2ART, HCO3, TCO2, ACIDBASEDEF, O2SAT   Coagulation Profile: No results for input(s): INR, PROTIME in the last 168 hours.  Cardiac Enzymes: No results for input(s): CKTOTAL, CKMB, CKMBINDEX, TROPONINI in the last 168 hours.  HbA1C: No results found for: HGBA1C  CBG: No results for input(s): GLUCAP in the last 168 hours.  Review of Systems:   Review of Systems  Constitutional: Positive for malaise/fatigue.  HENT: Negative.   Eyes: Negative.   Respiratory: Positive for shortness of breath.   Cardiovascular: Positive for orthopnea and leg swelling.  Gastrointestinal: Negative.   Genitourinary: Negative.   Musculoskeletal: Positive for joint pain.  Skin: Negative.   Neurological: Negative.   Endo/Heme/Allergies: Negative.   Psychiatric/Behavioral: Negative.      Past Medical History  He,  has a past medical history of Acute combined systolic and diastolic heart failure ([place]), BPH (benign prostatic hyperplasia), Cardiomyopathy ([place]), GERD (gastroesophageal reflux disease), Gout, HTN (hypertension), Hypocalcemia, Hypokalemia, Pulmonary embolism ([place]), Stroke (cerebrum) ([place]), and Syncope.   Surgical History    Past Surgical History:  Procedure Laterality Date   HAND SURGERY       Social History   reports that he has quit smoking. He has never used smokeless tobacco. He reports previous alcohol use. He reports that he does not use drugs.   Family History   His family history is not on file.   Allergies No Known Allergies   Home Medications  Prior to Admission medications    Medication Sig Start Date End Date Taking? Authorizing Provider  COLCRYS 0.6 MG tablet Take 0.6 mg by mouth 2 (two) times daily as needed. 10/20/18   [provider]  esomeprazole (NEXIUM) 40 MG capsule Take 40 mg by mouth daily. 10/20/18   [provider]  lisinopril (ZESTRIL) 40 MG tablet Take 40 mg by mouth daily. 11/04/18   [provider]  sildenafil (VIAGRA) 100 MG tablet Take 100 mg by mouth daily as needed for erectile dysfunction. One hour prior to sex 10/08/18   [provider]  timolol (TIMOPTIC) 0.5 % ophthalmic solution  10/21/18   [provider]     Critical care time: 35 min    Erick Colace ACNP-BC [place] Pager # [phone] OR # [phone] if no answer

## 2018-12-11 NOTE — Discharge Instructions (Signed)
Information on my medicine - ELIQUIS (apixaban)  Why was Eliquis prescribed for you? Eliquis was prescribed to treat blood clots that may have been found in the veins of your legs (deep vein thrombosis) or in your lungs (pulmonary embolism) and to reduce the risk of them occurring again.  What do You need to know about Eliquis ? The starting dose is 10 mg (two 5 mg tablets) taken TWICE daily for the FIRST SEVEN (7) DAYS, then on 12/16/2018, the dose is reduced to ONE 5 mg tablet taken TWICE daily.  Eliquis may be taken with or without food.   Try to take the dose about the same time in the morning and in the evening. If you have difficulty swallowing the tablet whole please discuss with your pharmacist how to take the medication safely.  Take Eliquis exactly as prescribed and DO NOT stop taking Eliquis without talking to the doctor who prescribed the medication.  Stopping may increase your risk of developing a new blood clot.  Refill your prescription before you run out.  After discharge, you should have regular check-up appointments with your healthcare provider that is prescribing your Eliquis.    What do you do if you miss a dose? If a dose of ELIQUIS is not taken at the scheduled time, take it as soon as possible on the same day and twice-daily administration should be resumed. The dose should not be doubled to make up for a missed dose.  Important Safety Information A possible side effect of Eliquis is bleeding. You should call your healthcare provider right away if you experience any of the following: ? Bleeding from an injury or your nose that does not stop. ? Unusual colored urine (red or dark brown) or unusual colored stools (red or black). ? Unusual bruising for unknown reasons. ? A serious fall or if you hit your head (even if there is no bleeding).  Some medicines may interact with Eliquis and might increase your risk of bleeding or clotting while on Eliquis. To help  avoid this, consult your healthcare provider or pharmacist prior to using any new prescription or non-prescription medications, including herbals, vitamins, non-steroidal anti-inflammatory drugs (NSAIDs) and supplements.  This website has more information on Eliquis (apixaban): http://www.eliquis.com/eliquis/home

## 2018-12-11 NOTE — TOC Initial Note (Signed)
Transition of Care [facility]) - Initial/Assessment Note    Patient Details  Name: Benjamin Woods MRN: [id number] Date of Birth: October 26, 1940  [facility] of [facility] CM/SW Contact:    Pollie Friar, RN Phone Number: 12/11/2018, 8:06 PM  Clinical Narrative:                 Pt selected [facility] for [facility] needs. Cory with [facility] will need to be notified when closer to d/c.  Pt with orders for a walker. [employer] will deliver to room at d/c.  Pt states he can afford the co pay for Eliquis. Pt will need 30 day free card at d/c. TOC following for further d/c needs.  Expected Discharge Plan: [place] Barriers to Discharge: Continued Medical Work up   Patient Goals and CMS Choice   CMS Medicare.gov Compare Post Acute Care list provided to:: Patient Choice offered to / list presented to : Patient  Expected Discharge Plan and Services Expected Discharge Plan: [place]   Discharge Planning Services: CM Consult Post Acute Care Choice: Home Health, Durable Medical Equipment                   DME Arranged: Walker rolling DME Agency: [employer]       HH Arranged: PT, OT HH Agency: [facility]        Prior Living Arrangements/Services   Lives with:: Spouse Patient language and need for interpreter reviewed:: Yes(no needs) Do you feel safe going back to the place where you live?: Yes            Criminal Activity/Legal Involvement Pertinent to Current Situation/Hospitalization: No - Comment as needed  Activities of Daily Living Home Assistive Devices/Equipment: Cane (specify quad or straight) ADL Screening (condition at time of admission) Patient's cognitive ability adequate to safely complete daily activities?: Yes Is the patient deaf or have difficulty hearing?: No Does the patient have difficulty seeing, even when wearing glasses/contacts?: No Does the patient have difficulty concentrating, remembering, or making decisions?:  No Patient able to express need for assistance with ADLs?: Yes Does the patient have difficulty dressing or bathing?: Yes Independently performs ADLs?: Yes (appropriate for developmental age) Does the patient have difficulty walking or climbing stairs?: Yes Weakness of Legs: Both Weakness of Arms/Hands: Both  Permission Sought/Granted                  Emotional Assessment Appearance:: Appears stated age Attitude/Demeanor/Rapport: Engaged Affect (typically observed): Pleasant, Accepting Orientation: : Oriented to Self, Oriented to Place   Psych Involvement: No (comment)  Admission diagnosis:  PULMONARY EMBOLISM Patient Active Problem List   Diagnosis Date Noted  . Pressure injury of skin 12/11/2018  . Pulmonary embolism ([place]) 12/10/2018  . Syncope 12/10/2018  . Elevated troponin 12/10/2018  . Protein-calorie malnutrition, moderate ([place]) 12/10/2018  . Acute combined systolic and diastolic congestive heart failure ([place]) 12/10/2018  . DVT (deep venous thrombosis) ([place]) 12/10/2018  . Stroke (cerebrum) ([place])   . HTN (hypertension)   . Gout   . BPH (benign prostatic hyperplasia)    PCP:  Helen Hashimoto., MD Pharmacy:   [place], [place] [address] Phone: [phone] Fax: [phone]     Social Determinants of Health (SDOH) Interventions    Readmission Risk Interventions No flowsheet data found.

## 2018-12-11 NOTE — Progress Notes (Signed)
PROGRESS NOTE    ESTEPHEN Woods  [id number] DOB: 1941-02-20 DOA: 12/10/2018 PCP: Helen Hashimoto., MD   Brief Narrative:  Benjamin Woods is a 78 y.o. male with medical history significant of prior left frontal stroke, hypertension, gout, GERD, BPH, who was initially admitted to [facility] due to syncope. Patient is transferred from [facility] Pt was initially admitted to [facility] due to syncope and found to have bilateral pulmonary embolism and DVT on 8/2. CT chest with contrast shows bilateral central pulmonary emboli with no evidence of right heart strain. Tele neurologist was consulted who recommended admitting patient to rule out stroke/seizures.  MRI of the brain obtained and revealed no acute infarcts old left anterior frontal lobe infarcts seen. EEG with no evidence of seizure activity. Echocardiogram obtained which showed new combined systolic and diastolic congestive heart failure with an EF of 30-35%. Pt also had elevated trop. Pt was treated with IV heparin and then switched to Eliquis. SARS-CoV-2 RNA was negative. Chest x-ray showed 8 mm nodular opacity in left lower lobe and bibasilar atelectasis. Pt was started with Rocephin and azithromycin for possible pneumonia.  During stay in hospital, pt developed transient hypotension with SBP as low as the 60s that responded to IV fluid and short-term usage of Levophed infusion. Subsequently patient redeveloped baseline hypertension. It is suspected patient may have thrown additional pulmonary emboli based on these findings. Of note an additional limited echo has been obtained by the cardiologists which now shows increase RVSP consistent with overload, worsening of RV systolic function which is now severely impaired, moderate enlargement of the right atrium, moderate TR, moderate pulmonary hypertension. Right atrial pressure 8 cm. Given these worrisome findings cardiology recommended transfer to tertiary facility for closer monitoring  given risk for rapid decompensation with associated severe LV dysfunction.   12/11/18: IR was consulted today for catheter directed lysis of pulmonary embolism for his submassive PE.  Cardiology has been following.  Patient will be transferred to ICU service after the procedure.  He remains hemodynamically stable.  He has been started on Eliquis.  Currently saturating fine on room air.  PT/OT evaluation done and recommended home health PT/OT.  Assessment & Plan:   Principal Problem:   Pulmonary embolism (HCC) Active Problems:   Stroke (cerebrum) (HCC)   HTN (hypertension)   Gout   BPH (benign prostatic hyperplasia)   Syncope   Elevated troponin   Protein-calorie malnutrition, moderate (HCC)   Acute combined systolic and diastolic congestive heart failure (HCC)   DVT (deep venous thrombosis) (HCC)   Pressure injury of skin   Syncope: Secondary to bilateral pulmonary evaluation.  MRI of the brain did not show any acute intracranial abnormalities.  EEG did not show any seizure activity. Patient evaluated by PT/OT and recommended home health PT/OT on discharge.  Combined systolic/diastolic congestive heart failure: Echocardiogram on 8/3 showed new combined  CHF with ejection fraction of 30- 35%. Repeat echo on 8/5 showed showed global hypokinesis of left ventricle with LV function of 25 to 30%, severely reduced RV systolic function and moderate pulmonary hypertension   This change suspected to be secondary to submassive PE.  Cardiology following.  He does not look volume overloaded.  Bilateral central pulmonary emboli : Started on Eliquis.  IR planning for catheter directed lysis of PE .  Currently his respiratory status stable.  Saturating fine on room air  Left lower extremity [id number] extremity venous duplex revealed nonocclusive DVT of left CFV and proximal left femoral vein.Continue eliquis.  Elevated troponin: Most likely secondary to supply demand ischemia secondary to PE and  congestive heart failure.  Denies any chest pain  Hypertension: Remains stable.  Continue current medicines.  Continue to monitor blood pressure  Gout: Has severe deformity of the hand joints.  Continue colchicine.  Follow-up with rheumatology as an outpatient warranted  BPH: On Flomax and finasteride.           DVT prophylaxis: Eliquis Code Status: Full Family Communication: Talked to daughter on phone on 12/11/2018. Disposition Plan: Home in next 1-2 days    Consultants: Cardiology, IR  Procedures: Echocardiogram  Antimicrobials:  Anti-infectives (From admission, onward)   Start     Dose/Rate Route Frequency Ordered Stop   12/11/18 2200  cefTRIAXone (ROCEPHIN) 1 g in sodium chloride 0.9 % 100 mL IVPB  Status:  Discontinued     1 g 200 mL/hr over 30 Minutes Intravenous Every 24 hours 12/10/18 2259 12/11/18 1531   12/11/18 0000  azithromycin (ZITHROMAX) 500 mg in sodium chloride 0.9 % 250 mL IVPB  Status:  Discontinued     500 mg 250 mL/hr over 60 Minutes Intravenous Every 24 hours 12/10/18 2259 12/11/18 1531   12/10/18 2315  cefTRIAXone (ROCEPHIN) 1 g in sodium chloride 0.9 % 100 mL IVPB     1 g 200 mL/hr over 30 Minutes Intravenous  Once 12/10/18 2306 12/11/18 0133      Subjective:  Patient seen and examined the bedside this morning.  Hemodynamically stable.  Remains comfortable and  denies any complaints like shortness of breath or chest pain.  Currently on room air.  Objective: Vitals:   12/10/18 2145 12/11/18 0611 12/11/18 0838 12/11/18 1423  BP: (!) 163/91 130/81 117/73 (!) 143/90  Pulse: 79 74  80  Resp: 18 18  18   Temp: 98.5 F (36.9 C) (!) 97.5 F (36.4 C)  (!) 97.3 F (36.3 C)  TempSrc: Oral Oral  Oral  SpO2: 100% 100%  98%  Weight: 86.9 kg 86.9 kg    Height: 6' (1.829 m)       Intake/Output Summary (Last 24 hours) at 12/11/2018 1533 Last data filed at 12/11/2018 1429 Gross per 24 hour  Intake 480 ml  Output 800 ml  Net -320 ml   Filed Weights    12/10/18 2145 12/11/18 0611  Weight: 86.9 kg 86.9 kg    Examination:  General exam: Appears calm and comfortable ,Not in distress,average built HEENT:PERRL,Oral mucosa moist, Ear/Nose normal on gross exam Respiratory system: Bilateral equal air entry, normal vesicular breath sounds, no wheezes or crackles  Cardiovascular system: S1 & S2 heard, RRR. No JVD, murmurs, rubs, gallops or clicks. No pedal edema. Gastrointestinal system: Abdomen is nondistended, soft and nontender. No organomegaly or masses felt. Normal bowel sounds heard. Central nervous system: Alert and oriented. No focal neurological deficits. Extremities: No edema, no clubbing ,no cyanosis, distal peripheral pulses palpable. 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 Basic Metabolic Panel: Recent Labs  Lab 12/11/18 0405  NA 134*  K 3.9  CL 103  CO2 21*  GLUCOSE 82  BUN 28*  CREATININE 1.18  CALCIUM 7.8*   GFR: Estimated Creatinine Clearance: 56.6 mL/min (by C-G formula based on SCr of 1.18 mg/dL). Liver Function Tests: No results  for input(s): AST, ALT, ALKPHOS, BILITOT, PROT, ALBUMIN in the last 168 hours. No results for input(s): LIPASE, AMYLASE in the last 168 hours. No results for input(s): AMMONIA in the last 168 hours. Coagulation Profile: No results for input(s): INR, PROTIME in the last 168 hours. Cardiac Enzymes: No results for input(s): CKTOTAL, CKMB, CKMBINDEX, TROPONINI in the last 168 hours. BNP (last 3 results) No results for input(s): PROBNP in the last 8760 hours. HbA1C: No results for input(s): HGBA1C in the last 72 hours. CBG: No results for input(s): GLUCAP in the last 168 hours. Lipid Profile: No results for input(s): CHOL, HDL, LDLCALC, TRIG,  CHOLHDL, LDLDIRECT in the last 72 hours. Thyroid Function Tests: No results for input(s): TSH, T4TOTAL, FREET4, T3FREE, THYROIDAB in the last 72 hours. Anemia Panel: No results for input(s): VITAMINB12, FOLATE, FERRITIN, TIBC, IRON, RETICCTPCT in the last 72 hours. Sepsis Labs: No results for input(s): PROCALCITON, LATICACIDVEN in the last 168 hours.  Recent Results (from the past 240 hour(s))  Culture, blood (Routine X 2) w Reflex to ID Panel     Status: None (Preliminary result)   Collection Time: 12/10/18 11:20 PM   Specimen: BLOOD LEFT HAND  Result Value Ref Range Status   Specimen Description BLOOD LEFT HAND  Final   Special Requests   Final    BOTTLES DRAWN AEROBIC ONLY Blood Culture adequate volume Performed at [facility], [address] [place], [place] [address]    Culture PENDING  Incomplete   Report Status PENDING  Incomplete         Radiology Studies: No results found.      Scheduled Meds:  apixaban  10 mg Oral BID   Followed by   Derrill Memo ON 12/16/2018] apixaban  5 mg Oral BID   colchicine  0.6 mg Oral Daily   feeding supplement (ENSURE ENLIVE)  237 mL Oral BID BM   finasteride  5 mg Oral Daily   lisinopril  10 mg Oral Daily   mouth rinse  15 mL Mouth Rinse BID   pantoprazole  40 mg Oral Q1200   sodium chloride flush  3 mL Intravenous Q12H   tamsulosin  0.4 mg Oral QPC supper   Continuous Infusions:  sodium chloride       LOS: 1 day    Time spent: 35 mins.More than 50% of that time was spent in counseling and/or coordination of care.      Shelly Coss, MD [facility] Pager [phone]  If 7PM-7AM, please contact night-coverage [email or web address] Password [facility] 12/11/2018, 3:33 PM

## 2018-12-11 NOTE — Care Management (Signed)
Per [place] Bidding with [employer]: co-pay amount for Eliquis 5 mg. twice a day for a 30 day supply $8.95. No PA required Pt. may use pharmacy of choice.( [facility]) [place] 3 medication Deductible $160.00 not met.

## 2018-12-11 NOTE — Progress Notes (Signed)
[place] for eliquis Indication: PE, DVT  No Known Allergies  Patient Measurements: Height: 6' (182.9 cm) Weight: 191 lb 9.3 oz (86.9 kg) IBW/kg (Calculated) : 77.6   Vital Signs: Temp: 97.5 F (36.4 C) (08/06 0611) Temp Source: Oral (08/06 0611) BP: 117/73 (08/06 0838) Pulse Rate: 74 (08/06 0611)  Labs: Recent Labs    12/11/18 0405  CREATININE 1.18    Estimated Creatinine Clearance: 56.6 mL/min (by C-G formula based on SCr of 1.18 mg/dL).   Medical History: Past Medical History:  Diagnosis Date  . Acute combined systolic and diastolic heart failure ([place])   . BPH (benign prostatic hyperplasia)   . Cardiomyopathy ([place])   . GERD (gastroesophageal reflux disease)   . Gout   . HTN (hypertension)   . Hypocalcemia   . Hypokalemia   . Pulmonary embolism ([place])   . Stroke (cerebrum) ([place])   . Syncope       Assessment: 78 yo man transferred from [place] to continue eliquis for PE/DVT.   I contacted [facility] and Eliquis  (10mg  po bid) was started on 8/4 at 11:44am. I also spoke with the patient about his medication today  Goal of Therapy:   Monitor platelets by anticoagulation protocol: Yes   Plan:  Continue eliquis 10 mg po bid.  Decrease to 5mg  po on 8/11 (will adjust orders)  Hildred Laser, PharmD Clinical Pharmacist **Pharmacist phone directory can now be found on [email or web address] ([email or web address]).  Listed under [place].

## 2018-12-11 NOTE — Evaluation (Signed)
Occupational Therapy Evaluation Patient Details Name: Benjamin Woods MRN: [id number] DOB: 1941/03/21 Today's Date: 12/11/2018    History of Present Illness 78 y.o. male with a hx of HTN, Stroke, GERD who transferred from [facility] with bilateral PE and cardiomyopathy who is being seen today for the evaluation of CHF at the request of Dr. Blaine Hamper.    Clinical Impression   Pt admitted with the above diagnoses and presents with below problem list. Pt will benefit from continued acute OT to address the below listed deficits and maximize independence with basic ADLs prior to d/c home. At baseline pt is independent with basic ADLs, drives. Pt endorses sedentary lifestyle. Pt presents with generalized weakness, impaired balance, and decreased activity tolerance. Pt currently min A for most ADLs. Spoke with daughter, Karna Christmas, who reports pt lives with spouse who is able to provide setup assist only. Optimistic that pt will progress with OT goals during course of hospital stay to be able to return home at d/c. Will continue to follow and update recommendations if indicated.      Follow Up Recommendations  Home health OT;Supervision/Assistance - 24 hour    Equipment Recommendations  3 in 1 bedside commode    Recommendations for Other Services PT consult     Precautions / Restrictions Precautions Precautions: Fall      Mobility Bed Mobility Overal bed mobility: Needs Assistance Bed Mobility: Supine to Sit     Supine to sit: Min assist;HOB elevated     General bed mobility comments: Pt asking for assist to power up trunk, reaching for therapist arm with some physical assist provided. Pt also requested HOB elevated  Transfers Overall transfer level: Needs assistance Equipment used: Rolling walker (2 wheeled) Transfers: Sit to/from Stand Sit to Stand: Min assist;From elevated surface         General transfer comment: from elevated bed height to recliner with pillow to boost  height. Pt pulling up on rw with both hands. Therapist providing steadying assist to pt and rw.     Balance Overall balance assessment: Needs assistance Sitting-balance support: Bilateral upper extremity supported;Feet supported Sitting balance-Leahy Scale: Fair     Standing balance support: Bilateral upper extremity supported Standing balance-Leahy Scale: Poor Standing balance comment: pt needing external support of rw + min A from therapist                           ADL either performed or assessed with clinical judgement   ADL Overall ADL's : Needs assistance/impaired Eating/Feeding: Set up;Sitting   Grooming: Set up;Sitting;Minimal assistance   Upper Body Bathing: Sitting;Minimal assistance   Lower Body Bathing: Minimal assistance;Sit to/from stand   Upper Body Dressing : Sitting;Minimal assistance   Lower Body Dressing: Minimal assistance;Sit to/from stand   Toilet Transfer: Minimal assistance;Ambulation;RW   Toileting- Clothing Manipulation and Hygiene: Minimal assistance;Sit to/from stand   Tub/ Shower Transfer: Minimal assistance;Ambulation;Rolling walker   Functional mobility during ADLs: Minimal assistance;Rolling walker General ADL Comments: Pt completed bed mobility, pivotal steps to recliner and walked in place about 30 seconds to simulate functional mobility     Vision         Perception     Praxis      Pertinent Vitals/Pain       Hand Dominance Right   Extremity/Trunk Assessment Upper Extremity Assessment Upper Extremity Assessment: Generalized weakness;RUE deficits/detail;LUE deficits/detail RUE Deficits / Details: AROM shoulder flexion to about 90*, arthritis throughout UE LUE Deficits /  Details: AROM shoulder flexion to about 90*, arthritis throughout UE   Lower Extremity Assessment Lower Extremity Assessment: Defer to PT evaluation       Communication Communication Communication: No difficulties   Cognition  Arousal/Alertness: Awake/alert Behavior During Therapy: Flat affect;WFL for tasks assessed/performed Overall Cognitive Status: No family/caregiver present to determine baseline cognitive functioning                                 General Comments: slow processing, some deayed reponses   General Comments  Pt placed on RA at start of session with sats ranging 94-100 during session. Left on RA.    Exercises     Shoulder Instructions      Home Living Family/patient expects to be discharged to:: Private residence Living Arrangements: Spouse/significant other Available Help at Discharge: Family;Available PRN/intermittently;Available 24 hours/day Type of Home: House Home Access: Level entry     Home Layout: Able to live on main level with bedroom/bathroom     Bathroom Shower/Tub: Tub/shower unit         Home Equipment: Shower seat   Additional Comments: spouse can provide setup assist,       Prior Functioning/Environment Level of Independence: Independent with assistive device(s)        Comments: pt reports he does not need physcial assist with ADLs. Limited mobility, does walk to/from bathroom. bathes and dresses himself per his report and confirmed by daughter, Terri via phone        OT Problem List: Decreased strength;Decreased activity tolerance;Impaired balance (sitting and/or standing);Decreased cognition;Decreased knowledge of use of DME or AE;Decreased knowledge of precautions;Cardiopulmonary status limiting activity;Pain      OT Treatment/Interventions: Self-care/ADL training;Therapeutic exercise;DME and/or AE instruction;Energy conservation;Therapeutic activities;Patient/family education;Balance training    OT Goals(Current goals can be found in the care plan section) Acute Rehab OT Goals Patient Stated Goal: get stronger OT Goal Formulation: With patient/family Time For Goal Achievement: 12/25/18 Potential to Achieve Goals: Good ADL Goals Pt  Will Perform Grooming: with set-up;sitting Pt Will Perform Upper Body Bathing: with set-up;sitting Pt Will Perform Lower Body Bathing: with supervision;sit to/from stand;with set-up Pt Will Perform Upper Body Dressing: with set-up;sitting Pt Will Perform Lower Body Dressing: with supervision;with set-up;sit to/from stand;sitting/lateral leans Pt Will Transfer to Toilet: with set-up;with supervision;ambulating Pt Will Perform Toileting - Clothing Manipulation and hygiene: with supervision;sit to/from stand;sitting/lateral leans Pt Will Perform Tub/Shower Transfer: Tub transfer;with supervision;ambulating;shower seat;rolling walker;grab bars Additional ADL Goal #1: Pt will complete bed mobility at mod I level to prepare for OOB ADLs.  OT Frequency: Min 2X/week   Barriers to D/C:    Spouse able to provide setup assist only       Co-evaluation              AM-PAC OT "6 Clicks" Daily Activity     Outcome Measure Help from another person eating meals?: None Help from another person taking care of personal grooming?: A Little Help from another person toileting, which includes using toliet, bedpan, or urinal?: A Little Help from another person bathing (including washing, rinsing, drying)?: A Little Help from another person to put on and taking off regular upper body clothing?: A Little Help from another person to put on and taking off regular lower body clothing?: A Little 6 Click Score: 19   End of Session Equipment Utilized During Treatment: Gait belt;Rolling walker Nurse Communication: Mobility status;Other (comment)(O2 sats on RA, left on RA)  Activity Tolerance: Patient limited by fatigue;Patient tolerated treatment well Patient left: in chair;with call bell/phone within reach;with chair alarm set  OT Visit Diagnosis: Unsteadiness on feet (R26.81);Other abnormalities of gait and mobility (R26.89);Muscle weakness (generalized) (M62.81);Other symptoms and signs involving cognitive  function;Pain                Time: 1005-1045 OT Time Calculation (min): 40 min Charges:  OT General Charges $OT Visit: 1 Visit OT Evaluation $OT Eval Moderate Complexity: 1 Mod OT Treatments $Self Care/Home Management : 8-22 mins  Tyrone Schimke, OT Acute Rehabilitation Services Pager: [phone] Office: [phone]   Hortencia Pilar 12/11/2018, 11:20 AM

## 2018-12-11 NOTE — Consult Note (Addendum)
Cardiology Consultation:   Patient ID: Benjamin BLAKENSHIP MRN: [id number]; DOB: [birth year]  Admit date: 12/10/2018 Date of Consult: 12/11/2018  Primary Care Provider: Helen Hashimoto., MD Primary Cardiologist: New    Patient Profile:   Benjamin Woods is a 78 y.o. male with a hx of HTN, Stroke, GERD who transferred from [facility] with bilateral PE and cardiomyopathy who is being seen today for the evaluation of CHF at the request of Dr. Blaine Hamper.   History of Present Illness:   Mr. Benjamin Woods has no prior cardiac history.  He was presented to [facility] 8/2 with recurrent episode of syncope.  Work-up revealed bilateral pulmonary embolism and left lower extremity DVT.  Questionable pneumonia.  He was treated with IV heparin and transitioned to Eliquis.  Echocardiogram showed new cardiomyopathy with LV function of 25 to 30% with RV strain and RV dysfunction.  On 8/5 had a transient hypotension requiring cardiology evaluation.  Improved with IV fluid Levophed.  Troponin was elevated as well.  He was transferred to [facility] for further evaluation.  Prior to admission patient denies any cardiac symptoms.  Sedentary lifestyle.  No regular exercise.  Remote smoking.  He denies alcohol abuse or illicit drug use.  No family history of cardiac disease.  Heart Pathway Score:     Past Medical History:  Diagnosis Date  . Acute combined systolic and diastolic heart failure ([place])   . BPH (benign prostatic hyperplasia)   . Cardiomyopathy ([place])   . GERD (gastroesophageal reflux disease)   . Gout   . HTN (hypertension)   . Hypocalcemia   . Hypokalemia   . Pulmonary embolism ([place])   . Stroke (cerebrum) ([place])   . Syncope     Past Surgical History:  Procedure Laterality Date  . HAND SURGERY      Inpatient Medications: Scheduled Meds: . apixaban  10 mg Oral BID  . colchicine  0.6 mg Oral Daily  . feeding supplement (ENSURE ENLIVE)  237 mL Oral BID BM  . finasteride   5 mg Oral Daily  . lisinopril  10 mg Oral Daily  . mouth rinse  15 mL Mouth Rinse BID  . pantoprazole  40 mg Oral Q1200  . sodium chloride flush  3 mL Intravenous Q12H  . tamsulosin  0.4 mg Oral QPC supper   Continuous Infusions: . sodium chloride    . azithromycin 500 mg (12/11/18 0207)  . cefTRIAXone (ROCEPHIN)  IV     PRN Meds: sodium chloride, acetaminophen, albuterol, dextromethorphan-guaiFENesin, hydrALAZINE, HYDROcodone-acetaminophen, ondansetron (ZOFRAN) IV, sodium chloride flush  Allergies:   No Known Allergies  Social History:   Social History   Socioeconomic History  . Marital status: Married    Spouse name: Not on file  . Number of children: Not on file  . Years of education: Not on file  . Highest education level: Not on file  Occupational History  . Not on file  Social Needs  . Financial resource strain: Not on file  . Food insecurity    Worry: Not on file    Inability: Not on file  . Transportation needs    Medical: Not on file    Non-medical: Not on file  Tobacco Use  . Smoking status: Former Research scientist (life sciences)  . Smokeless tobacco: Never Used  Substance and Sexual Activity  . Alcohol use: Not Currently    Frequency: Never  . Drug use: Never  . Sexual activity: Not Currently  Lifestyle  . Physical activity  Days per week: Not on file    Minutes per session: Not on file  . Stress: Not on file  Relationships  . Social Herbalist on phone: Not on file    Gets together: Not on file    Attends religious service: Not on file    Active member of club or organization: Not on file    Attends meetings of clubs or organizations: Not on file    Relationship status: Not on file  . Intimate partner violence    Fear of current or ex partner: Not on file    Emotionally abused: Not on file    Physically abused: Not on file    Forced sexual activity: Not on file  Other Topics Concern  . Not on file  Social History Narrative  . Not on file    Family  History:   Denies family history of cardiac disease  ROS:  Please see the history of present illness.  All other ROS reviewed and negative.     Physical Exam/Data:   Vitals:   12/10/18 2145 12/11/18 0611 12/11/18 0838  BP: (!) 163/91 130/81 117/73  Pulse: 79 74   Resp: 18 18   Temp: 98.5 F (36.9 C) (!) 97.5 F (36.4 C)   TempSrc: Oral Oral   SpO2: 100% 100%   Weight: 86.9 kg 86.9 kg   Height: 6' (1.829 m)      Intake/Output Summary (Last 24 hours) at 12/11/2018 0913 Last data filed at 12/11/2018 0000 Gross per 24 hour  Intake -  Output 250 ml  Net -250 ml   Last 3 Weights 12/11/2018 12/10/2018  Weight (lbs) 191 lb 9.3 oz 191 lb 9.3 oz  Weight (kg) 86.9 kg 86.9 kg     Body mass index is 25.98 kg/m.  General:  Elderly male in no acute distress HEENT: normal Lymph: no adenopathy Neck: no JVD Endocrine:  No thryomegaly Vascular: No carotid bruits; FA pulses 2+ bilaterally without bruits  Cardiac:  normal S1, S2; RRR; no murmur  Lungs:  clear to auscultation bilaterally, no wheezing, rhonchi or rales  Abd: soft, nontender, no hepatomegaly  Ext: no edema Musculoskeletal:  BUE and BLE strength normal and equal, arthritic changes in joints Skin: warm and dry  Neuro:  CNs 2-12 intact, no focal abnormalities noted Psych:  Normal affect   EKG:  The EKG was personally reviewed and demonstrates:  SR at rate of 78 bpm, interior lateral TWI with LVH Telemetry:  Telemetry was personally reviewed and demonstrates:  SR at controlled rate  Relevant CV Studies: Reviewed echo from outside hospital   Laboratory Data:   Chemistry Recent Labs  Lab 12/11/18 0405  NA 134*  K 3.9  CL 103  CO2 21*  GLUCOSE 82  BUN 28*  CREATININE 1.18  CALCIUM 7.8*  GFRNONAA 59*  GFRAA >60  ANIONGAP 10    BNP Recent Labs  Lab 12/10/18 2318  BNP 1,997.9*     Radiology/Studies:  No results found.  Assessment and Plan:   1. Chronic combined CHF -Echocardiogram 8/3 with LV function  of 30 to 35%, impaired diastolic dysfunction, mild anterior septal and apical anterior wall motion hypokinesis, RV pressure 31 mmHg. -Repeat limited echo 8/5 showed global hypokinesis of left ventricle with LV function of 25 to 30%, severely reduced RV systolic function and moderate pulmonary hypertension -He is not volume overloaded significantly by exam.  BNP is almost 2000 -We will repeat stat echocardiogram here  2. Bilateral PE and left lower extremity edema -Sedentary lifestyle -IV heparin initiated now on Eliquis 200 mg twice daily -IR evaluation  3.  Syncope -Likely related to PE - no palpitations - No arrhythmias on tele  4. Elevated troponin - Peak of troponin 0.62. EKG with interior lateral TWI with LVH - No chest pain - Likely demand ischemia  5. HTN - BP stable on current medications    For questions or updates, please contact [place] Please consult [email or web address] for contact info under     Jarrett Soho, PA  12/11/2018 9:13 AM   Patient examined chart reviewed Exam with black male in no distress. Very sedentary at home indicated LLE swelling more than right. Lungs clear no significant murmur mild LLE edema. Bedside echo shows EF 25-30% only mild RV enlargement no effusion and estimated PA only around 40 mmHg. Should have IR radiology review CTA scans from [place] If he has had a large clot burden that is bilateral and caused syncope x 2 should see if he is a candidate for any thrombolytic or mechanical Rx for his PE. 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

## 2018-12-11 NOTE — H&P (Signed)
Chief Complaint: Pulmonary Embolism  Referring Physician(s): Tawanna Solo, Amrit  Supervising Physician: Corrie Mckusick  Patient Status: [facility] - In-pt  History of Present Illness: Benjamin Woods is a 78 y.o. male who was transferred from [facility] to [facility] for pulmonary embolism.  ECHO was initially without right heart strain, but repeat ECHO showed global hypokinesis of left ventricle with LV function of 25 to 30%, severely reduced RV systolic function and moderate pulmonary hypertension. BNP is almost 2000.  We are asked to evaluate him for catheter directed PE lysis.  He denies any recent surgery, any cancer, no bleeding episodes, no GI bleeding, no intracranial bleeding.  He is currently comfortable, no complaints. SaO2 100% on room air.   He is on Eliquis and heparin drip.  Past Medical History:  Diagnosis Date  . Acute combined systolic and diastolic heart failure ([place])   . BPH (benign prostatic hyperplasia)   . Cardiomyopathy ([place])   . GERD (gastroesophageal reflux disease)   . Gout   . HTN (hypertension)   . Hypocalcemia   . Hypokalemia   . Pulmonary embolism ([place])   . Stroke (cerebrum) ([place])   . Syncope     Past Surgical History:  Procedure Laterality Date  . HAND SURGERY      Allergies: Patient has no known allergies.  Medications: Prior to Admission medications   Not on File     History reviewed. No pertinent family history.  Social History   Socioeconomic History  . Marital status: Married    Spouse name: Not on file  . Number of children: Not on file  . Years of education: Not on file  . Highest education level: Not on file  Occupational History  . Not on file  Social Needs  . Financial resource strain: Not on file  . Food insecurity    Worry: Not on file    Inability: Not on file  . Transportation needs    Medical: Not on file    Non-medical: Not on file  Tobacco Use  . Smoking status: Former Research scientist (life sciences)  . Smokeless  tobacco: Never Used  Substance and Sexual Activity  . Alcohol use: Not Currently    Frequency: Never  . Drug use: Never  . Sexual activity: Not Currently  Lifestyle  . Physical activity    Days per week: Not on file    Minutes per session: Not on file  . Stress: Not on file  Relationships  . Social Herbalist on phone: Not on file    Gets together: Not on file    Attends religious service: Not on file    Active member of club or organization: Not on file    Attends meetings of clubs or organizations: Not on file    Relationship status: Not on file  Other Topics Concern  . Not on file  Social History Narrative  . Not on file     Review of Systems: A 12 point ROS discussed and pertinent positives are indicated in the HPI above.  All other systems are negative.  Review of Systems  Vital Signs: BP 117/73   Pulse 74   Temp (!) 97.5 F (36.4 C) (Oral)   Resp 18   Ht 6' (1.829 m)   Wt 86.9 kg   SpO2 100%   BMI 25.98 kg/m   Physical Exam Vitals signs reviewed.  Constitutional:      Appearance: Normal appearance.  HENT:  Head: Normocephalic and atraumatic.  Eyes:     Extraocular Movements: Extraocular movements intact.  Neck:     Musculoskeletal: Normal range of motion.  Cardiovascular:     Rate and Rhythm: Normal rate and regular rhythm.  Pulmonary:     Effort: Pulmonary effort is normal.     Breath sounds: Normal breath sounds.     Comments: SaO2 100 % on Room air Abdominal:     Palpations: Abdomen is soft.  Musculoskeletal: Normal range of motion.  Skin:    General: Skin is warm and dry.  Neurological:     General: No focal deficit present.     Mental Status: He is alert and oriented to person, place, and time.  Psychiatric:        Mood and Affect: Mood normal.        Behavior: Behavior normal.        Thought Content: Thought content normal.        Judgment: Judgment normal.     Imaging: No results found.  Labs:  CBC: No results  for input(s): WBC, HGB, HCT, PLT in the last 8760 hours.  COAGS: No results for input(s): INR, APTT in the last 8760 hours.  BMP: Recent Labs    12/11/18 0405  NA 134*  K 3.9  CL 103  CO2 21*  GLUCOSE 82  BUN 28*  CALCIUM 7.8*  CREATININE 1.18  GFRNONAA 59*  GFRAA >60    LIVER FUNCTION TESTS: No results for input(s): BILITOT, AST, ALT, ALKPHOS, PROT, ALBUMIN in the last 8760 hours.  TUMOR MARKERS: No results for input(s): AFPTM, CEA, CA199, CHROMGRNA in the last 8760 hours.  Assessment and Plan:  PE with right heart strain.  Stable on room air.  Dr. Earleen Newport discussed with CCM  Will hold off on lysis for now and reassess patient again tomorrow.  Risks and benefits discussed with the patient and his daughter including, but not limited to bleeding, possible life threatening bleeding and need for blood product transfusion, vascular injury, stroke, contrast induced renal failure, limb loss and infection.  All of the patient's questions were answered.  Thank you for this interesting consult.  I greatly enjoyed meeting DAMARII BEATO and look forward to participating in their care.  A copy of this report was sent to the requesting provider on this date.  Electronically Signed: Murrell Redden, PA-C   12/11/2018, 1:42 PM      I spent a total of 40 Minutes in face to face in clinical consultation, greater than 50% of which was counseling/coordinating care for consideration for PE lysis.

## 2018-12-11 NOTE — Progress Notes (Signed)
  Echocardiogram 2D Echocardiogram has been performed.  Benjamin Woods 12/11/2018, 9:38 AM

## 2018-12-12 ENCOUNTER — Inpatient Hospital Stay (HOSPITAL_COMMUNITY): Payer: Medicare HMO

## 2018-12-12 DIAGNOSIS — I824Y2 Acute embolism and thrombosis of unspecified deep veins of left proximal lower extremity: Secondary | ICD-10-CM | POA: Diagnosis not present

## 2018-12-12 DIAGNOSIS — I5021 Acute systolic (congestive) heart failure: Secondary | ICD-10-CM | POA: Diagnosis not present

## 2018-12-12 DIAGNOSIS — I2699 Other pulmonary embolism without acute cor pulmonale: Secondary | ICD-10-CM

## 2018-12-12 DIAGNOSIS — L89302 Pressure ulcer of unspecified buttock, stage 2: Secondary | ICD-10-CM

## 2018-12-12 DIAGNOSIS — E46 Unspecified protein-calorie malnutrition: Secondary | ICD-10-CM | POA: Diagnosis not present

## 2018-12-12 DIAGNOSIS — J9601 Acute respiratory failure with hypoxia: Secondary | ICD-10-CM

## 2018-12-12 DIAGNOSIS — I82409 Acute embolism and thrombosis of unspecified deep veins of unspecified lower extremity: Secondary | ICD-10-CM

## 2018-12-12 LAB — BASIC METABOLIC PANEL
Anion gap: 10 (ref 5–15)
BUN: 22 mg/dL (ref 8–23)
CO2: 22 mmol/L (ref 22–32)
Calcium: 8.1 mg/dL — ABNORMAL LOW (ref 8.9–10.3)
Chloride: 102 mmol/L (ref 98–111)
Creatinine, Ser: 1.25 mg/dL — ABNORMAL HIGH (ref 0.61–1.24)
GFR calc Af Amer: >60 mL/min (ref >60)
GFR calc non Af Amer: 55 mL/min — ABNORMAL LOW (ref >60)
Glucose, Bld: 82 mg/dL (ref 70–99)
Potassium: 4 mmol/L (ref 3.5–5.1)
Sodium: 134 mmol/L — ABNORMAL LOW (ref 135–145)

## 2018-12-12 LAB — CBC WITH DIFFERENTIAL/PLATELET
Abs Immature Granulocytes: 0.09 10*3/uL — ABNORMAL HIGH (ref 0.00–0.07)
Basophils Absolute: 0 10*3/uL (ref 0.0–0.1)
Basophils Relative: 0 %
Eosinophils Absolute: 0.2 10*3/uL (ref 0.0–0.5)
Eosinophils Relative: 3 %
HCT: 38 % — ABNORMAL LOW (ref 39.0–52.0)
Hemoglobin: 12.1 g/dL — ABNORMAL LOW (ref 13.0–17.0)
Immature Granulocytes: 2 %
Lymphocytes Relative: 28 %
Lymphs Abs: 1.4 10*3/uL (ref 0.7–4.0)
MCH: 25.9 pg — ABNORMAL LOW (ref 26.0–34.0)
MCHC: 31.8 g/dL (ref 30.0–36.0)
MCV: 81.4 fL (ref 80.0–100.0)
Monocytes Absolute: 0.4 10*3/uL (ref 0.1–1.0)
Monocytes Relative: 8 %
Neutro Abs: 3.1 10*3/uL (ref 1.7–7.7)
Neutrophils Relative %: 59 %
Platelets: 190 10*3/uL (ref 150–400)
RBC: 4.67 MIL/uL (ref 4.22–5.81)
RDW: 15.9 % — ABNORMAL HIGH (ref 11.5–15.5)
WBC: 5.2 10*3/uL (ref 4.0–10.5)
nRBC: 0 % (ref 0.0–0.2)

## 2018-12-12 LAB — ECHOCARDIOGRAM LIMITED
Height: 72 in
Weight: 3008.84 oz

## 2018-12-12 MED ORDER — LISINOPRIL 20 MG PO TABS: 40.0000 mg | ORAL_TABLET | Freq: Every day | ORAL | Status: DC

## 2018-12-12 MED ORDER — CHLORHEXIDINE GLUCONATE CLOTH 2 % EX PADS
6.0000 | MEDICATED_PAD | Freq: Every day | CUTANEOUS | Status: DC
  Administered 2018-12-12 – 2018-12-13 (×2): 6 via TOPICAL

## 2018-12-12 MED ORDER — LOSARTAN POTASSIUM 50 MG PO TABS
100.0000 mg | ORAL_TABLET | Freq: Every day | ORAL | Status: DC
  Administered 2018-12-12: 100 mg via ORAL
  Filled 2018-12-12: qty 2

## 2018-12-12 MED ORDER — LISINOPRIL 20 MG PO TABS
40.0000 mg | ORAL_TABLET | Freq: Every day | ORAL | Status: DC
  Filled 2018-12-12: qty 2

## 2018-12-12 MED ORDER — CARVEDILOL 3.125 MG PO TABS
3.1250 mg | ORAL_TABLET | Freq: Two times a day (BID) | ORAL | Status: DC
  Administered 2018-12-12 – 2018-12-13 (×2): 3.125 mg via ORAL
  Filled 2018-12-12 (×2): qty 1

## 2018-12-12 NOTE — Progress Notes (Signed)
    Subjective:  BP elevated less dyspnea no chest pain  Appears decision was made not to proceed with catheter based lysis   Objective:  Vitals:   12/12/18 0500 12/12/18 0600 12/12/18 0700 12/12/18 0722  BP: (!) 145/85 (!) 155/81 (!) 168/93   Pulse: 73 74 77   Resp: 15 17 18    Temp:    98.1 F (36.7 C)  TempSrc:    Oral  SpO2: 98% 99% 100%   Weight:      Height:        Intake/Output from previous day:  Intake/Output Summary (Last 24 hours) at 12/12/2018 0759 Last data filed at 12/12/2018 0600 Gross per 24 hour  Intake 720 ml  Output 1550 ml  Net -830 ml    Physical Exam:  Affect appropriate Healthy:  appears stated age HEENT: normal Neck supple with no adenopathy JVP normal no bruits no thyromegaly Lungs clear with no wheezing and good diaphragmatic motion Heart:  S1/S2 no murmur, no rub, gallop or click PMI normal Abdomen: benighn, BS positve, no tenderness, no AAA no bruit.  No HSM or HJR Distal pulses intact with no bruits No edema Neuro non-focal Skin warm and dry No muscular weakness   Lab Results: Basic Metabolic Panel: Recent Labs    12/11/18 0405 12/12/18 0255  NA 134* 134*  K 3.9 4.0  CL 103 102  CO2 21* 22  GLUCOSE 82 82  BUN 28* 22  CREATININE 1.18 1.25*  CALCIUM 7.8* 8.1*   Liver Function Tests: No results for input(s): AST, ALT, ALKPHOS, BILITOT, PROT, ALBUMIN in the last 72 hours. No results for input(s): LIPASE, AMYLASE in the last 72 hours. CBC: Recent Labs    12/12/18 0255  WBC 5.2  NEUTROABS 3.1  HGB 12.1*  HCT 38.0*  MCV 81.4  PLT 190   Cardiac Enzymes:  Imaging: No results found.  Cardiac Studies:  ECG: SR lateral biphasic T waves    Telemetry:  NSR 12/12/2018   Echo: Diffuse hypokinesis EF 25-30% no cor pulmonale   Medications:   . apixaban  10 mg Oral BID   Followed by  . [START ON 12/16/2018] apixaban  5 mg Oral BID  . colchicine  0.6 mg Oral Daily  . feeding supplement (ENSURE ENLIVE)  237 mL Oral BID BM   . finasteride  5 mg Oral Daily  . lisinopril  10 mg Oral Daily  . mouth rinse  15 mL Mouth Rinse BID  . pantoprazole  40 mg Oral Q1200  . sodium chloride flush  3 mL Intravenous Q12H  . tamsulosin  0.4 mg Oral QPC supper     . sodium chloride      Assessment/Plan:   PE:  By CT report submassive involving both main PA;s ? 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  Start coreg and increase ACE should have daily diuretic dose as well Will need outpatient risk stratification for CAD when acute event over   Jenkins Rouge 12/12/2018, 7:59 AM

## 2018-12-12 NOTE — Progress Notes (Signed)
I spoke with the patient's daughter about patient's condition, plan of care and all questions were addressed.  

## 2018-12-12 NOTE — Progress Notes (Signed)
Referring Physician(s): * No referring provider recorded for this case *  Supervising Physician: Markus Daft  Patient Status:  [facility] - In-pt  Chief Complaint: Shortness of breath  Subjective: Patient resting comfortably in bed.  Not on supplemental oxygen at this time.  Breathing comfortably.  States he was able to ambulateyesterday without difficulty.  Allergies: Patient has no known allergies.  Medications: Prior to Admission medications   Medication Sig Start Date End Date Taking? Authorizing Provider  COLCRYS 0.6 MG tablet Take 0.6 mg by mouth 2 (two) times daily as needed (Gout).  10/20/18  Yes [provider]  esomeprazole (NEXIUM) 40 MG capsule Take 40 mg by mouth daily. 10/20/18  Yes [provider]  lisinopril (ZESTRIL) 40 MG tablet Take 40 mg by mouth daily. 11/04/18  Yes [provider]  sildenafil (VIAGRA) 100 MG tablet Take 100 mg by mouth daily as needed for erectile dysfunction. One hour prior to sex 10/08/18  Yes [provider]  timolol (TIMOPTIC) 0.5 % ophthalmic solution Place 1 drop into both eyes daily.  10/21/18  Yes [provider]     Vital Signs: BP 104/63   Pulse 86   Temp 98 F (36.7 C) (Oral)   Resp 19   Ht 6' (1.829 m)   Wt 188 lb 0.8 oz (85.3 kg)   SpO2 98%   BMI 25.50 kg/m   Physical Exam  NAD, alert Chest: No increased work of breathing, no shortness of breath, lungs clear  Imaging: Vas [place] Lower Extremity Venous (dvt)  Result Date: 12/12/2018  Lower Venous Study Indications: DVT in unspecified vessel seen at outside hospital.  Performing Technologist: Maudry Mayhew MHA, RDMS, RVT, RDCS  Examination Guidelines: A complete evaluation includes B-mode imaging, spectral Doppler, color Doppler, and power Doppler as needed of all accessible portions of each vessel. Bilateral testing is considered an integral part of a complete examination. Limited examinations for reoccurring indications may be  performed as noted.  +---------+---------------+---------+-----------+----------+-------+ RIGHT    CompressibilityPhasicitySpontaneityPropertiesSummary +---------+---------------+---------+-----------+----------+-------+ CFV      Full           Yes      Yes                          +---------+---------------+---------+-----------+----------+-------+ SFJ      Full                                                 +---------+---------------+---------+-----------+----------+-------+ FV Prox  Full                                                 +---------+---------------+---------+-----------+----------+-------+ FV Mid   Full                                                 +---------+---------------+---------+-----------+----------+-------+ FV DistalFull                                                 +---------+---------------+---------+-----------+----------+-------+  PFV      Full                                                 +---------+---------------+---------+-----------+----------+-------+ POP      Full           Yes      Yes                          +---------+---------------+---------+-----------+----------+-------+ PTV      Full                                                 +---------+---------------+---------+-----------+----------+-------+ PERO     Full                                                 +---------+---------------+---------+-----------+----------+-------+  +---------+---------------+---------+-----------+----------+-------------------+ LEFT     CompressibilityPhasicitySpontaneityPropertiesSummary             +---------+---------------+---------+-----------+----------+-------------------+ CFV      Full           Yes      Yes                                      +---------+---------------+---------+-----------+----------+-------------------+ SFJ      Full                                                              +---------+---------------+---------+-----------+----------+-------------------+ FV Prox  Full                                                             +---------+---------------+---------+-----------+----------+-------------------+ FV Mid   Full                                                             +---------+---------------+---------+-----------+----------+-------------------+ FV DistalFull                                                             +---------+---------------+---------+-----------+----------+-------------------+ PFV      None                    No  Acute               +---------+---------------+---------+-----------+----------+-------------------+ POP      Full           Yes      Yes                                      +---------+---------------+---------+-----------+----------+-------------------+ PTV                                                   Not adequately                                                            visualized          +---------+---------------+---------+-----------+----------+-------------------+ PERO                                                  Not adequately                                                            visualized          +---------+---------------+---------+-----------+----------+-------------------+   Summary: Right: There is no evidence of deep vein thrombosis in the lower extremity. No cystic structure found in the popliteal fossa. Left: Findings consistent with acute deep vein thrombosis involving the left proximal profunda vein. No cystic structure found in the popliteal fossa.  *See table(s) above for measurements and observations.    Preliminary     Labs:  CBC: Recent Labs    12/12/18 0255  WBC 5.2  HGB 12.1*  HCT 38.0*  PLT 190    COAGS: No results for input(s): INR, APTT in the last 8760 hours.  BMP: Recent Labs    12/11/18  0405 12/12/18 0255  NA 134* 134*  K 3.9 4.0  CL 103 102  CO2 21* 22  GLUCOSE 82 82  BUN 28* 22  CALCIUM 7.8* 8.1*  CREATININE 1.18 1.25*  GFRNONAA 59* 55*  GFRAA >60 >60    LIVER FUNCTION TESTS: No results for input(s): BILITOT, AST, ALT, ALKPHOS, PROT, ALBUMIN in the last 8760 hours.  Assessment and Plan: Pulmonary embolus Patient followed by IR for possible PE lysis.  Patient was found to be stable yesterday.  Was able to start Eliquis.  Remained stable overnight.  Currently on room air.  O2 100%.  States he is able to get out of bed and ambulate without dyspnea. Per RN, plans to transfer to floor today. No current plans for intervention in IR. IR remains available if needed.  Electronically Signed: Docia Barrier, PA 12/12/2018, 2:05 PM   I spent a total of 15 Minutes at the the patient's bedside AND on the patient's hospital floor or  unit, greater than 50% of which was counseling/coordinating care for pulmonary embolus.

## 2018-12-12 NOTE — Progress Notes (Addendum)
PROGRESS NOTE    Benjamin Woods  [id number] DOB: 1940/06/14 DOA: 12/10/2018 PCP: Helen Hashimoto., MD    Brief Narrative:  78 year old male who presented with syncope.  He does have significant past medical history for left frontal stroke, hypertension, gout, GERD, and BPH.    Patient was initially admitted to [facility] due to syncope.    He was diagnosed with bilateral pulmonary embolism and deep vein thrombosis August 2.  He also underwent work-up for CVA/seizures.  A new CVA or seizure was ruled out through MRI and electroencephalography.  He was found to have a new LV dysfunction with ejection fraction 30 to 35%, he was treated with anticoagulation with IV heparin and then transitioned to apixaban.  His chest imaging showed a possible pulmonary infection and he was treated with ceftriaxone and azithromycin. During his hospital stay he developed hypotension that required vasopressors, and right ventricular failure.  Echocardiography.  He was transferred for further management.  At the time of the transfer his blood pressure was 163/91, pulse rate 79, respiratory rate 18, temperature 98.5, oxygen saturation 100%, he was awake and alert, his lungs were clear to auscultation bilaterally, heart S1-S2 present and rhythmic, his abdomen was soft, no lower extremity edema.  Patient was evaluated by cardiology and interventional radiology, plan for possible direct catheter thrombolysis.  Patient remained with stable hemodynamics.   Assessment & Plan:   Principal Problem:   Pulmonary embolism (HCC) Active Problems:   Stroke (cerebrum) (HCC)   HTN (hypertension)   Gout   BPH (benign prostatic hyperplasia)   Syncope   Elevated troponin   Protein-calorie malnutrition, moderate (HCC)   Acute combined systolic and diastolic congestive heart failure (HCC)   DVT (deep venous thrombosis) (HCC)   Pressure injury of skin    1. Submassive to massive bilateral pulmonary  embolism/ deep vein thrombosis/ possible provoked. Patient has remained stable, his blood pressure is 178/101 with HR of 78, oxygenation 98 to 100% on room air. Echo with RV with normal systolic function. On hold thrombolysis for now, will continue anticoagulation with aoixaban for now. Continue telemetry monitoring.   2. New systolic heart failure. Left ventricle with EF 25 to 30% diffuse hypokinesis with inferior basal akinesis. Blood pressure 178/101 mmHg this am, will resume heart failure regimen with carvedilol and will use valsartan instead of lisinopril, possible transition to entresto.   3. Pneumonia (present on admission). Patient received antibiotic therapy. This am patient afebrile, with no cough or leukocytosis, will follow on chest films 2 views today.    4. HTN. Uncontrolled HTN, will resume carvedilol and losartan.   5. Calorie protein malnutrition. Follow with nutrition consultation.   6. 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 No signs of acute flare.   7. Bilateral stage 2, buttocks pressure ulcers, present on admission. Continue local wound care and physical therapy evaluation.   DVT prophylaxis: apixaban   Code Status:  full Family Communication:  No family at the bedside  Disposition Plan/ discharge barriers: transfer to telemetry   Body mass index is 25.5 kg/m. Malnutrition Type:      Malnutrition Characteristics:      Nutrition Interventions:     RN Pressure Injury Documentation: Pressure Injury 12/10/18 Buttocks Right Stage II -  Partial thickness loss of dermis presenting as a shallow open ulcer with a red, pink wound bed without slough. excoriated 5 cmx 0.5 cm (Active)  12/10/18 2200  Location: Buttocks  Location Orientation: Right  Staging: Stage II -  Partial thickness loss of dermis presenting as a shallow open ulcer with a red, pink wound bed without slough.  Wound  Description (Comments): excoriated 5 cmx 0.5 cm  Present on Admission: Yes     Pressure Injury 12/10/18 Buttocks Left Stage II -  Partial thickness loss of dermis presenting as a shallow open ulcer with a red, pink wound bed without slough. Excoriated 0.5 cm x 0.5 cm (Active)  12/10/18 2200  Location: Buttocks  Location Orientation: Left  Staging: Stage II -  Partial thickness loss of dermis presenting as a shallow open ulcer with a red, pink wound bed without slough.  Wound Description (Comments): Excoriated 0.5 cm x 0.5 cm  Present on Admission: Yes     Consultants:   Cardiology   [facility]  IR   Procedures:     Antimicrobials:       Subjective: Patient is feeling well, no dyspnea or chest pain, positive weakness and not yet back to baseline. Apparently at home he was seating, and after standing up felt dizzy and lost is consciousness for a brief period of time.    Objective: Vitals:   12/12/18 0722 12/12/18 0730 12/12/18 0742 12/12/18 0800  BP:  (!) 190/103 (!) 181/98 (!) 173/95  Pulse:  81 80 76  Resp:  (!) 22 17 20   Temp: 98.1 F (36.7 C)     TempSrc: Oral     SpO2:  98% 98% 96%  Weight:      Height:        Intake/Output Summary (Last 24 hours) at 12/12/2018 0913 Last data filed at 12/12/2018 0600 Gross per 24 hour  Intake 720 ml  Output 1550 ml  Net -830 ml   Filed Weights   12/10/18 2145 12/11/18 0611 12/12/18 0456  Weight: 86.9 kg 86.9 kg 85.3 kg    Examination:   General: deconditioned Neurology: Awake and alert, non focal  E ENT: no pallor, no icterus, oral mucosa moist Cardiovascular: No JVD. S1-S2 present, rhythmic, no gallops, rubs, or murmurs. Trace pitting lower extremity edema. Pulmonary: positive breath sounds bilaterally, adequate air movement, no wheezing, rhonchi or rales. Gastrointestinal. Abdomen with, no organomegaly, non tender, no rebound or guarding Skin. No rashes Musculoskeletal: no joint deformities/ bilateral hand deformities  with multiple tophi, non tender. Positive bilateral knee hypertrophy.      Data Reviewed: I have personally reviewed following labs and imaging studies  CBC: Recent Labs  Lab 12/12/18 0255  WBC 5.2  NEUTROABS 3.1  HGB 12.1*  HCT 38.0*  MCV 81.4  PLT 99991111   Basic Metabolic Panel: Recent Labs  Lab 12/11/18 0405 12/12/18 0255  NA 134* 134*  K 3.9 4.0  CL 103 102  CO2 21* 22  GLUCOSE 82 82  BUN 28* 22  CREATININE 1.18 1.25*  CALCIUM 7.8* 8.1*   GFR: Estimated Creatinine Clearance: 53.5 mL/min (A) (by C-G formula based on SCr of 1.25 mg/dL (H)). Liver Function Tests: No results for input(s): AST, ALT, ALKPHOS, BILITOT, PROT, ALBUMIN in the last 168 hours. No results for input(s): LIPASE, AMYLASE in the last 168 hours. No results for input(s): AMMONIA in the last 168 hours. Coagulation Profile: No results for input(s): INR, PROTIME in the last 168 hours. Cardiac Enzymes: No results for input(s): CKTOTAL, CKMB, CKMBINDEX, TROPONINI in the last 168 hours. BNP (last 3 results) No results for input(s): PROBNP in the last 8760 hours. HbA1C: No results for input(s): HGBA1C in  the last 72 hours. CBG: No results for input(s): GLUCAP in the last 168 hours. Lipid Profile: No results for input(s): CHOL, HDL, LDLCALC, TRIG, CHOLHDL, LDLDIRECT in the last 72 hours. Thyroid Function Tests: No results for input(s): TSH, T4TOTAL, FREET4, T3FREE, THYROIDAB in the last 72 hours. Anemia Panel: No results for input(s): VITAMINB12, FOLATE, FERRITIN, TIBC, IRON, RETICCTPCT in the last 72 hours.    Radiology Studies: I have reviewed all of the imaging during this hospital visit personally     Scheduled Meds: . apixaban  10 mg Oral BID   Followed by  . [START ON 12/16/2018] apixaban  5 mg Oral BID  . Chlorhexidine Gluconate Cloth  6 each Topical Daily  . colchicine  0.6 mg Oral Daily  . feeding supplement (ENSURE ENLIVE)  237 mL Oral BID BM  . finasteride  5 mg Oral Daily  .  lisinopril  10 mg Oral Daily  . mouth rinse  15 mL Mouth Rinse BID  . pantoprazole  40 mg Oral Q1200  . sodium chloride flush  3 mL Intravenous Q12H  . tamsulosin  0.4 mg Oral QPC supper   Continuous Infusions: . sodium chloride       LOS: 2 days        Chaka Boyson Gerome Apley, MD

## 2018-12-12 NOTE — Progress Notes (Addendum)
Bilateral lower extremity venous duplex completed. Refer to "CV Proc" under chart review to view preliminary results.  Critical results discussed with Dr. Cathlean Sauer.  12/12/2018 12:19 PM Maudry Mayhew, MHA, RVT, RDCS, RDMS

## 2018-12-12 NOTE — Progress Notes (Addendum)
NAME:Sovereign F Dusing, ZF:[id number], DOB:[birth year], LOS:1 ADMISSION DATE:12/10/2018, CONSULTATION DATE:12/11/2018 REFERRING [id number] - [facility], CHIEF COMPLAINT:Submassive PE  Brief History  78 yo M with submassive PE, going to IR for directed lysis and will transfer to ICU following procedure.  History of present illness  78 yo M PMH prior CVA, HTN, GERD, gout, BPH, who initially presented to [facility] 8/2 for syncope and was transferred to [facility] 8/6. On 8/2 patient found to have bilateral Pulmonary Embolism and DVT, without evidence of right heart strain on CT. CXR revealed 63mm LLL nodular opacity and atelectasis. Rocephin and Azithromycin was started for possible PNA. SARS CoV2 was negative.   MRI brain without acute infarct, EEG without seizure activity. ECHO with new combined systolic and diastolic CHF, LVEF 99991111. Patient was started on heparin gtt, later transitioned to eliquis. Patient developed hypotension, SBP 60s, which was fluid responsive but did require short-term NE gtt. This heightened concern for additional PE. Subsequent ECHO with worsening RV systolic function, moderate RA enlargement, increase RVSP. Decision was made to transfer patient to [facility] for closer monitoring and further care. Patient transferred 12/11/2018.  At [facility], patient admitted to [facility] 8/6 and seen by cardiology on 8/7, and by IR 8/7. IR planning to take patient for directed lysis after which time patient will be transferred to ICU for monitoring and care.   Past Medical History  HTN, GERD, Gout, CVA, BPH, PE, Acute systolic and diastolic CHF  Significant Hospital Events  8/2 presented to [facility], started on heparin gtt for PE. Transitioned to eliquis. Acutely decompensated.  8/6 Transferred to [facility]  Consults:  Cardiology IR PCCM  Procedures:  N/A  Significant Diagnostic Tests:  LE [place] 8/6: >>> Repeat echo 8/6>> Micro Data:  8/6 BCx >>  Antimicrobials:   8/6 Azithromycin >> 8/6 Ceftriaxone >>  Interim history/subjective:  No events overnight, no new complaints  Objective   Blood pressure (!) 173/95, pulse 76, temperature 98.1 F (36.7 C), temperature source Oral, resp. rate 20, height 6' (1.829 m), weight 85.3 kg, SpO2 96 %.        Intake/Output Summary (Last 24 hours) at 12/12/2018 0903 Last data filed at 12/12/2018 0600 Gross per 24 hour  Intake 720 ml  Output 1550 ml  Net -830 ml   Filed Weights   12/10/18 2145 12/11/18 0611 12/12/18 0456  Weight: 86.9 kg 86.9 kg 85.3 kg    Examination: General: A/O x4, in no acute distress, afebrile, nondiaphoretic HEENT: PEERL, EMO intact Cardio: RRR, no mrg's  Pulmonary: CTA bilaterally, no wheezing or crackles  Abdomen: Bowel sounds normal, soft, nontender  MSK: BLE nontender, nonedematous Neuro: Alert, strength 5/5 upper and lower Psych: Appropriate affect, not depressed in appearance, engages well  Resolved Hospital Problem list   n/a  Assessment & Plan:   Submassive bilateral pulmonary emboli and DVT  Only provoking factor currently identifiable is sedentary life style thus far identified. Follow up echo by our cardiology department showed biventricular dysfxn w severely reduced RV fxn so IR consulted BUT currently he is hemodynamically stable and on no supplemental oxygen and did not require catheter directed thrombolysis or systemic TPA. He remained stable overnight with minimal symptoms. Has been transitioned to Eliquis. -CDT not completed -Continue eliquis -Continue close cardiac monitoring x 24-48 hours -Stable for transfer to floor -Cardiology following  Biventricular CHF: Newly found on DCM. Cardiology following and recommending increased ACE and initiation of coreg with outpatient risk stratification for CAD. -Increase lisinopril to home dose 40mg  daily  starting today -Will defer starting coreg 3.125 BID to primary   HTN: Patient markedly hypertensive today.  Cardiology okay with increasing his lisinopril and starting coreg. In the setting of PE we will prefer PRN antihypertensives and increasing his lisinopril to 40mg  daily, his home dose.  -PRN hydralazine -Increase lisinopril to 40mg  daily  GOUT: Patient with terrible tophaceous gout of the hands most notably. He is chronically on colchicine 0.6mg  BID at home. He is not on a uric acid lowering agent. -Continue colchicine 0.6mg  daily, discuss BID dosing vs uric acid lowering agent with patient    Best practice:  Diet: NPO, okay to advance  Pain/Anxiety/Delirium protocol (if indicated): n/a VAP protocol (if indicated): n/a DVT prophylaxis: Eliquis full dose GI prophylaxis: Protonix Glucose control: n/a Mobility: As tolerated Code Status: Full Family Communication: n/a Disposition: Transfer to floor today  Labs   CBC: Recent Labs  Lab 12/12/18 0255  WBC 5.2  NEUTROABS 3.1  HGB 12.1*  HCT 38.0*  MCV 81.4  PLT 99991111    Basic Metabolic Panel: Recent Labs  Lab 12/11/18 0405 12/12/18 0255  NA 134* 134*  K 3.9 4.0  CL 103 102  CO2 21* 22  GLUCOSE 82 82  BUN 28* 22  CREATININE 1.18 1.25*  CALCIUM 7.8* 8.1*   GFR: Estimated Creatinine Clearance: 53.5 mL/min (A) (by C-G formula based on SCr of 1.25 mg/dL (H)). Recent Labs  Lab 12/12/18 0255  WBC 5.2    Liver Function Tests: No results for input(s): AST, ALT, ALKPHOS, BILITOT, PROT, ALBUMIN in the last 168 hours. No results for input(s): LIPASE, AMYLASE in the last 168 hours. No results for input(s): AMMONIA in the last 168 hours.  ABG No results found for: PHART, PCO2ART, PO2ART, HCO3, TCO2, ACIDBASEDEF, O2SAT   Coagulation Profile: No results for input(s): INR, PROTIME in the last 168 hours.  Cardiac Enzymes: No results for input(s): CKTOTAL, CKMB, CKMBINDEX, TROPONINI in the last 168 hours.  HbA1C: No results found for: HGBA1C  CBG: No results for input(s): GLUCAP in the last 168 hours.  Past Medical  History  He,  has a past medical history of Acute combined systolic and diastolic heart failure ([place]), BPH (benign prostatic hyperplasia), Cardiomyopathy ([place]), GERD (gastroesophageal reflux disease), Gout, HTN (hypertension), Hypocalcemia, Hypokalemia, Pulmonary embolism ([place]), Stroke (cerebrum) ([place]), and Syncope.   Surgical History    Past Surgical History:  Procedure Laterality Date  . HAND SURGERY       Social History   reports that he has quit smoking. He has never used smokeless tobacco. He reports previous alcohol use. He reports that he does not use drugs.   Family History   His family history is not on file.   Allergies No Known Allergies   Home Medications  Prior to Admission medications   Medication Sig Start Date End Date Taking? Authorizing Provider  COLCRYS 0.6 MG tablet Take 0.6 mg by mouth 2 (two) times daily as needed. 10/20/18   [provider]  esomeprazole (NEXIUM) 40 MG capsule Take 40 mg by mouth daily. 10/20/18   [provider]  lisinopril (ZESTRIL) 40 MG tablet Take 40 mg by mouth daily. 11/04/18   [provider]  sildenafil (VIAGRA) 100 MG tablet Take 100 mg by mouth daily as needed for erectile dysfunction. One hour prior to sex 10/08/18   [provider]  timolol (TIMOPTIC) 0.5 % ophthalmic solution  10/21/18   [provider]     Critical care time:  Kathi Ludwig, MD [facility] Internal Medicine, PGY-3  Please see A/P and/or Addendum for final recommendations by: Dr. Nelda Marseille  Attending Note:  78 year old male with PE and new diagnosis of CHF who presents to PCCM with hypoxemia and heart failure.  No events overnight, no new complaints.  On exam, lungs are clear.  I reviewed CT myself, PE noted.  Discussed with resident.  PE:  - Heparin  - Need to discuss coumadin vs NOAC  Hypoxemia:  - Titrate O2 for sat of 88-92%, hopefully off  - Will need an ambulatory desaturation study prior to  discharge for ?home O2  HTN:  - Increase lisinopril  - Labetalol held for now  CHF:  - Cards following  - Diureses as BP tolerates  - Tele monitoring  Transfer out of the ICU and to [facility] service with PCCM off 8/8  Patient seen and examined, agree with above note.  I dictated the care and orders written for this patient under my direction.  Rush Farmer, [place]

## 2018-12-12 NOTE — Progress Notes (Signed)
  Echocardiogram 2D Echocardiogram has been performed.  Benjamin Woods 12/12/2018, 2:47 PM

## 2018-12-13 ENCOUNTER — Inpatient Hospital Stay (HOSPITAL_COMMUNITY): Payer: Medicare HMO

## 2018-12-13 DIAGNOSIS — I2601 Septic pulmonary embolism with acute cor pulmonale: Secondary | ICD-10-CM

## 2018-12-13 DIAGNOSIS — M1A0491 Idiopathic chronic gout, unspecified hand, with tophus (tophi): Secondary | ICD-10-CM

## 2018-12-13 LAB — BASIC METABOLIC PANEL
Anion gap: 12 (ref 5–15)
BUN: 24 mg/dL — ABNORMAL HIGH (ref 8–23)
CO2: 21 mmol/L — ABNORMAL LOW (ref 22–32)
Calcium: 8.3 mg/dL — ABNORMAL LOW (ref 8.9–10.3)
Chloride: 100 mmol/L (ref 98–111)
Creatinine, Ser: 1.17 mg/dL (ref 0.61–1.24)
GFR calc Af Amer: >60 mL/min (ref >60)
GFR calc non Af Amer: 59 mL/min — ABNORMAL LOW (ref >60)
Glucose, Bld: 105 mg/dL — ABNORMAL HIGH (ref 70–99)
Potassium: 4 mmol/L (ref 3.5–5.1)
Sodium: 133 mmol/L — ABNORMAL LOW (ref 135–145)

## 2018-12-13 LAB — CBC WITH DIFFERENTIAL/PLATELET
Abs Immature Granulocytes: 0.09 10*3/uL — ABNORMAL HIGH (ref 0.00–0.07)
Basophils Absolute: 0 10*3/uL (ref 0.0–0.1)
Basophils Relative: 0 %
Eosinophils Absolute: 0.1 10*3/uL (ref 0.0–0.5)
Eosinophils Relative: 2 %
HCT: 36.9 % — ABNORMAL LOW (ref 39.0–52.0)
Hemoglobin: 11.8 g/dL — ABNORMAL LOW (ref 13.0–17.0)
Immature Granulocytes: 1 %
Lymphocytes Relative: 20 %
Lymphs Abs: 1.4 10*3/uL (ref 0.7–4.0)
MCH: 25.9 pg — ABNORMAL LOW (ref 26.0–34.0)
MCHC: 32 g/dL (ref 30.0–36.0)
MCV: 81.1 fL (ref 80.0–100.0)
Monocytes Absolute: 0.5 10*3/uL (ref 0.1–1.0)
Monocytes Relative: 7 %
Neutro Abs: 4.6 10*3/uL (ref 1.7–7.7)
Neutrophils Relative %: 70 %
Platelets: 197 10*3/uL (ref 150–400)
RBC: 4.55 MIL/uL (ref 4.22–5.81)
RDW: 15.6 % — ABNORMAL HIGH (ref 11.5–15.5)
WBC: 6.7 10*3/uL (ref 4.0–10.5)
nRBC: 0 % (ref 0.0–0.2)

## 2018-12-13 MED ORDER — CARVEDILOL 6.25 MG PO TABS
6.2500 mg | ORAL_TABLET | Freq: Two times a day (BID) | ORAL | Status: DC
  Administered 2018-12-13 – 2018-12-14 (×3): 6.25 mg via ORAL
  Filled 2018-12-13 (×4): qty 1

## 2018-12-13 MED ORDER — SACUBITRIL-VALSARTAN 24-26 MG PO TABS
1.0000 | ORAL_TABLET | Freq: Two times a day (BID) | ORAL | Status: DC
  Administered 2018-12-13 – 2018-12-14 (×3): 1 via ORAL
  Filled 2018-12-13 (×4): qty 1

## 2018-12-13 MED ORDER — FUROSEMIDE 20 MG PO TABS
20.0000 mg | ORAL_TABLET | Freq: Every day | ORAL | Status: DC
  Administered 2018-12-13 – 2018-12-14 (×2): 20 mg via ORAL
  Filled 2018-12-13 (×2): qty 1

## 2018-12-13 NOTE — Progress Notes (Signed)
Daughter called for update on pt. Pt says it's ok to give information to daughter, Benjamin Woods. Daughter wants to know if pt can see a Education officer, museum- for eligibility for home health and equipment & also if there are any discounts for eliquis.

## 2018-12-13 NOTE — Progress Notes (Signed)
Subjective:  BP elevated less dyspnea no chest pain  Appears decision was made not to proceed with catheter based lysis   Objective:  Vitals:   12/13/18 0400 12/13/18 0500 12/13/18 0600 12/13/18 0700  BP: (!) 92/57 (!) 110/59 129/81 (!) 142/82  Pulse: 87 82 84 77  Resp: 18 16 20 19   Temp:    97.6 F (36.4 C)  TempSrc:    Oral  SpO2: 100% 98% 100% 98%  Weight:  84.2 kg    Height:        Intake/Output from previous day:  Intake/Output Summary (Last 24 hours) at 12/13/2018 0900 Last data filed at 12/13/2018 0400 Gross per 24 hour  Intake 360 ml  Output 1550 ml  Net -1190 ml    Physical Exam:  Affect appropriate Healthy:  appears stated age HEENT: normal Neck supple with no adenopathy JVP normal no bruits no thyromegaly Lungs clear with no wheezing and good diaphragmatic motion Heart:  S1/S2 no murmur, no rub, gallop or click PMI normal Abdomen: benighn, BS positve, no tenderness, no AAA no bruit.  No HSM or HJR Distal pulses intact with no bruits No edema Neuro non-focal Skin warm and dry No muscular weakness   Lab Results: Basic Metabolic Panel: Recent Labs    12/12/18 0255 12/13/18 0314  NA 134* 133*  K 4.0 4.0  CL 102 100  CO2 22 21*  GLUCOSE 82 105*  BUN 22 24*  CREATININE 1.25* 1.17  CALCIUM 8.1* 8.3*   Liver Function Tests: No results for input(s): AST, ALT, ALKPHOS, BILITOT, PROT, ALBUMIN in the last 72 hours. No results for input(s): LIPASE, AMYLASE in the last 72 hours. CBC: Recent Labs    12/12/18 0255 12/13/18 0314  WBC 5.2 6.7  NEUTROABS 3.1 4.6  HGB 12.1* 11.8*  HCT 38.0* 36.9*  MCV 81.4 81.1  PLT 190 197   Cardiac Enzymes:  Imaging: Vas [place] Lower Extremity Venous (dvt)  Result Date: 12/12/2018  Lower Venous Study Indications: DVT in unspecified vessel seen at outside hospital.  Performing Technologist: Maudry Mayhew MHA, RDMS, RVT, RDCS  Examination Guidelines: A complete evaluation includes B-mode imaging, spectral  Doppler, color Doppler, and power Doppler as needed of all accessible portions of each vessel. Bilateral testing is considered an integral part of a complete examination. Limited examinations for reoccurring indications may be performed as noted.  +---------+---------------+---------+-----------+----------+-------+ RIGHT    CompressibilityPhasicitySpontaneityPropertiesSummary +---------+---------------+---------+-----------+----------+-------+ CFV      Full           Yes      Yes                          +---------+---------------+---------+-----------+----------+-------+ SFJ      Full                                                 +---------+---------------+---------+-----------+----------+-------+ FV Prox  Full                                                 +---------+---------------+---------+-----------+----------+-------+ FV Mid   Full                                                 +---------+---------------+---------+-----------+----------+-------+  FV DistalFull                                                 +---------+---------------+---------+-----------+----------+-------+ PFV      Full                                                 +---------+---------------+---------+-----------+----------+-------+ POP      Full           Yes      Yes                          +---------+---------------+---------+-----------+----------+-------+ PTV      Full                                                 +---------+---------------+---------+-----------+----------+-------+ PERO     Full                                                 +---------+---------------+---------+-----------+----------+-------+  +---------+---------------+---------+-----------+----------+-------------------+ LEFT     CompressibilityPhasicitySpontaneityPropertiesSummary             +---------+---------------+---------+-----------+----------+-------------------+ CFV       Full           Yes      Yes                                      +---------+---------------+---------+-----------+----------+-------------------+ SFJ      Full                                                             +---------+---------------+---------+-----------+----------+-------------------+ FV Prox  Full                                                             +---------+---------------+---------+-----------+----------+-------------------+ FV Mid   Full                                                             +---------+---------------+---------+-----------+----------+-------------------+ FV DistalFull                                                             +---------+---------------+---------+-----------+----------+-------------------+  PFV      None                    No                   Acute               +---------+---------------+---------+-----------+----------+-------------------+ POP      Full           Yes      Yes                                      +---------+---------------+---------+-----------+----------+-------------------+ PTV                                                   Not adequately                                                            visualized          +---------+---------------+---------+-----------+----------+-------------------+ PERO                                                  Not adequately                                                            visualized          +---------+---------------+---------+-----------+----------+-------------------+   Summary: Right: There is no evidence of deep vein thrombosis in the lower extremity. No cystic structure found in the popliteal fossa. Left: Findings consistent with acute deep vein thrombosis involving the left proximal profunda vein. Left popliteal fossa has a heterogenous, partially cystic structure with internal echoes and  posterior acoustic artifact. This is suggestive of possible abscess vs. unknown etiology.  *See table(s) above for measurements and observations.    Preliminary     Cardiac Studies:  ECG: SR lateral biphasic T waves    Telemetry:  NSR 12/13/2018   Echo: Diffuse hypokinesis EF 25-30% no cor pulmonale   Medications:   . apixaban  10 mg Oral BID   Followed by  . [START ON 12/16/2018] apixaban  5 mg Oral BID  . carvedilol  3.125 mg Oral BID WC  . Chlorhexidine Gluconate Cloth  6 each Topical Daily  . colchicine  0.6 mg Oral Daily  . feeding supplement (ENSURE ENLIVE)  237 mL Oral BID BM  . finasteride  5 mg Oral Daily  . mouth rinse  15 mL Mouth Rinse BID  . pantoprazole  40 mg Oral Q1200  . sacubitril-valsartan  1 tablet Oral BID  . sodium chloride flush  3 mL Intravenous Q12H  . tamsulosin  0.4 mg Oral QPC supper     . sodium chloride  Assessment/Plan:   PE:  By CT report submassive involving both main PA;s ? 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  On coreg will transition to entresto last dose ACE two days ago. Continue anticoagulation for PE increase coreg and add low dose daily duretic  Jenkins Rouge 12/13/2018, 9:00 AM

## 2018-12-13 NOTE — Progress Notes (Signed)
PROGRESS NOTE    Benjamin Woods  [id number] DOB: 1941-01-18 DOA: 12/10/2018 PCP: Helen Hashimoto., MD    Brief Narrative:  78 year old male who presented with syncope.  He does have significant past medical history for left frontal stroke, hypertension, gout, GERD, and BPH.    Patient was initially admitted to [facility] due to syncope.    He was diagnosed with bilateral pulmonary embolism and deep vein thrombosis August 2.  He also underwent work-up for CVA/seizures.  A new CVA or seizure was ruled out through MRI and electroencephalography.  He was found to have a new LV dysfunction with ejection fraction 30 to 35%, he was treated with anticoagulation with IV heparin and then transitioned to apixaban.  His chest imaging showed a possible pulmonary infection and he was treated with ceftriaxone and azithromycin. During his hospital stay he developed hypotension that required vasopressors, and right ventricular failure.  Echocardiography.  He was transferred for further management.  At the time of the transfer his blood pressure was 163/91, pulse rate 79, respiratory rate 18, temperature 98.5, oxygen saturation 100%, he was awake and alert, his lungs were clear to auscultation bilaterally, heart S1-S2 present and rhythmic, his abdomen was soft, no lower extremity edema.  Patient was evaluated by cardiology and interventional radiology, plan for possible direct catheter thrombolysis.  Patient remained with stable hemodynamics.    Assessment & Plan:   Principal Problem:   Pulmonary embolism (HCC) Active Problems:   Stroke (cerebrum) (HCC)   HTN (hypertension)   Gout   BPH (benign prostatic hyperplasia)   Syncope   Elevated troponin   Protein-calorie malnutrition, moderate (HCC)   Acute combined systolic and diastolic congestive heart failure (HCC)   DVT (deep venous thrombosis) (HCC)   Pressure injury of skin    1. Submassive to massive bilateral pulmonary  embolism/ deep vein thrombosis/ possible provoked. Patient continue to be hemodynamically stable, blood pressure 115/75 with HR 83 and oxygenating at 100 % on room air. Will continue anticoagulation with apixaban. Out of bed to chair. Will need home health services.   2. New acute systolic heart failure. Left ventricle with EF 25 to 30% diffuse hypokinesis with inferior basal akinesis. Positive signs of hypervolemia with lower extremity edema, will continue heart failure management with carvedilol and Entresto, started on furosemide for diuresis, to keep fluid balance negative.  3. Pneumonia (present on admission). Patient with no signs of systemic infection, will follow on chest film, continue to hold on antibiotic therapy.    4. HTN. Continue blood pressure control with carvedilol and Entresto.   5. Calorie protein malnutrition. Continue with nutritional supplements.   6. Gout. 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 No signs of acute flare. Will need outpatient follow up.   7. Bilateral stage 2, buttocks pressure ulcers, present on admission. Out of bed as tolerated, physical therapy and local wound care .  DVT prophylaxis: apixaban   Code Status:  full Family Communication:  No family at the bedside  Disposition Plan/ discharge barriers: transfer to telemetry   Body mass index is 25.18 kg/m. Malnutrition Type:      Malnutrition Characteristics:      Nutrition Interventions:     RN Pressure Injury Documentation: Pressure Injury 12/10/18 Buttocks Right Stage II -  Partial thickness loss of dermis presenting as a shallow open ulcer with a red, pink wound bed without slough. excoriated 5 cmx 0.5 cm (Active)  12/10/18 2200  Location:  Buttocks  Location Orientation: Right  Staging: Stage II -  Partial thickness loss of dermis presenting as a shallow open ulcer with a red, pink wound bed without slough.  Wound  Description (Comments): excoriated 5 cmx 0.5 cm  Present on Admission: Yes     Pressure Injury 12/10/18 Buttocks Left Stage II -  Partial thickness loss of dermis presenting as a shallow open ulcer with a red, pink wound bed without slough. Excoriated 0.5 cm x 0.5 cm (Active)  12/10/18 2200  Location: Buttocks  Location Orientation: Left  Staging: Stage II -  Partial thickness loss of dermis presenting as a shallow open ulcer with a red, pink wound bed without slough.  Wound Description (Comments): Excoriated 0.5 cm x 0.5 cm  Present on Admission: Yes     Consultants:   Cardiology   IR  Procedures:     Antimicrobials:       Subjective: Patient is feeling better, dyspnea continue to improve but not yet back to baseline, positive lower extremity edema and generalized weakness, no nausea or vomiting.   Objective: Vitals:   12/13/18 0900 12/13/18 1000 12/13/18 1100 12/13/18 1200  BP: 130/79 130/72 129/78 115/75  Pulse: 80 80 86 83  Resp: 19 18 17 16   Temp:      TempSrc:      SpO2: 100% 98% 100% 100%  Weight:      Height:        Intake/Output Summary (Last 24 hours) at 12/13/2018 1217 Last data filed at 12/13/2018 0900 Gross per 24 hour  Intake 720 ml  Output 1550 ml  Net -830 ml   Filed Weights   12/11/18 0611 12/12/18 0456 12/13/18 0500  Weight: 86.9 kg 85.3 kg 84.2 kg    Examination:   General: deconditioned  Neurology: Awake and alert, non focal  E ENT: no pallor, no icterus, oral mucosa moist Cardiovascular: No JVD. S1-S2 present, rhythmic, no gallops, rubs, or murmurs. +/++ pitting bilateral lower extremity edema. Pulmonary: decreased breath sounds bilaterally, no wheezing, rhonchi or rales. Gastrointestinal. Abdomen with no organomegaly, non tender, no rebound or guarding Skin. No rashes Musculoskeletal: no joint deformities     Data Reviewed: I have personally reviewed following labs and imaging studies  CBC: Recent Labs  Lab 12/12/18 0255  12/13/18 0314  WBC 5.2 6.7  NEUTROABS 3.1 4.6  HGB 12.1* 11.8*  HCT 38.0* 36.9*  MCV 81.4 81.1  PLT 190 [id number]   Basic Metabolic Panel: Recent Labs  Lab 12/11/18 0405 12/12/18 0255 12/13/18 0314  NA 134* 134* 133*  K 3.9 4.0 4.0  CL 103 102 100  CO2 21* 22 21*  GLUCOSE 82 82 105*  BUN 28* 22 24*  CREATININE 1.18 1.25* 1.17  CALCIUM 7.8* 8.1* 8.3*   GFR: Estimated Creatinine Clearance: 57.1 mL/min (by C-G formula based on SCr of 1.17 mg/dL). Liver Function Tests: No results for input(s): AST, ALT, ALKPHOS, BILITOT, PROT, ALBUMIN in the last 168 hours. No results for input(s): LIPASE, AMYLASE in the last 168 hours. No results for input(s): AMMONIA in the last 168 hours. Coagulation Profile: No results for input(s): INR, PROTIME in the last 168 hours. Cardiac Enzymes: No results for input(s): CKTOTAL, CKMB, CKMBINDEX, TROPONINI in the last 168 hours. BNP (last 3 results) No results for input(s): PROBNP in the last 8760 hours. HbA1C: No results for input(s): HGBA1C in the last 72 hours. CBG: No results for input(s): GLUCAP in the last 168 hours. Lipid Profile: No results for input(s):  CHOL, HDL, LDLCALC, TRIG, CHOLHDL, LDLDIRECT in the last 72 hours. Thyroid Function Tests: No results for input(s): TSH, T4TOTAL, FREET4, T3FREE, THYROIDAB in the last 72 hours. Anemia Panel: No results for input(s): VITAMINB12, FOLATE, FERRITIN, TIBC, IRON, RETICCTPCT in the last 72 hours.    Radiology Studies: I have reviewed all of the imaging during this hospital visit personally     Scheduled Meds: . apixaban  10 mg Oral BID   Followed by  . [START ON 12/16/2018] apixaban  5 mg Oral BID  . carvedilol  6.25 mg Oral BID WC  . Chlorhexidine Gluconate Cloth  6 each Topical Daily  . colchicine  0.6 mg Oral Daily  . feeding supplement (ENSURE ENLIVE)  237 mL Oral BID BM  . finasteride  5 mg Oral Daily  . furosemide  20 mg Oral Daily  . mouth rinse  15 mL Mouth Rinse BID  .  pantoprazole  40 mg Oral Q1200  . sacubitril-valsartan  1 tablet Oral BID  . sodium chloride flush  3 mL Intravenous Q12H  . tamsulosin  0.4 mg Oral QPC supper   Continuous Infusions: . sodium chloride       LOS: 3 days        Keonia Pasko Gerome Apley, MD

## 2018-12-14 DIAGNOSIS — I824Z2 Acute embolism and thrombosis of unspecified deep veins of left distal lower extremity: Secondary | ICD-10-CM

## 2018-12-14 LAB — BASIC METABOLIC PANEL
Anion gap: 13 (ref 5–15)
BUN: 28 mg/dL — ABNORMAL HIGH (ref 8–23)
CO2: 22 mmol/L (ref 22–32)
Calcium: 8.5 mg/dL — ABNORMAL LOW (ref 8.9–10.3)
Chloride: 99 mmol/L (ref 98–111)
Creatinine, Ser: 1.29 mg/dL — ABNORMAL HIGH (ref 0.61–1.24)
GFR calc Af Amer: >60 mL/min (ref >60)
GFR calc non Af Amer: 53 mL/min — ABNORMAL LOW (ref >60)
Glucose, Bld: 111 mg/dL — ABNORMAL HIGH (ref 70–99)
Potassium: 4.4 mmol/L (ref 3.5–5.1)
Sodium: 134 mmol/L — ABNORMAL LOW (ref 135–145)

## 2018-12-14 LAB — SARS CORONAVIRUS 2 (TAT 6-24 HRS): SARS Coronavirus 2: NEGATIVE

## 2018-12-14 MED ORDER — ENSURE ENLIVE PO LIQD: 237.0000 mL | Freq: Two times a day (BID) | ORAL | 0 refills | Status: AC

## 2018-12-14 MED ORDER — FUROSEMIDE 20 MG PO TABS: 20.0000 mg | ORAL_TABLET | Freq: Every day | ORAL | 0 refills | Status: DC

## 2018-12-14 MED ORDER — CARVEDILOL 6.25 MG PO TABS: 6.2500 mg | ORAL_TABLET | Freq: Two times a day (BID) | ORAL | 0 refills | Status: DC

## 2018-12-14 MED ORDER — SACUBITRIL-VALSARTAN 24-26 MG PO TABS: 1.0000 | ORAL_TABLET | Freq: Two times a day (BID) | ORAL | 0 refills | Status: AC

## 2018-12-14 MED ORDER — APIXABAN 5 MG PO TABS: ORAL_TABLET | ORAL | 0 refills | Status: DC

## 2018-12-14 MED ORDER — SACUBITRIL-VALSARTAN 24-26 MG PO TABS: 1.0000 | ORAL_TABLET | Freq: Two times a day (BID) | ORAL | 0 refills | Status: DC

## 2018-12-14 NOTE — TOC Transition Note (Addendum)
Transition of Care [facility]) - CM/SW Discharge Note   Patient Details  Name: Benjamin Woods MRN: [id number] Date of Birth: 12-31-1940  Transition of Care [facility]) CM/SW Contact:  Erenest Rasher, RN Phone Number:  [phone] 12/14/2018, 2:23 PM   Clinical Narrative:    Pt received his RW, provided with Eliquis and Entresto 30 day free trial card. Faxed to [facility] on [address]. Notified dtr to pick up his meds at [facility], his pharmacy is closed on Sunday. Notified [facility] that pt was dc home today.   Delene Loll is not covered, will need prior auth for his refills. He received first 30 days of Entresto for free. They had Eliquis and Entresto in stock.    Final next level of care: Home w Home Health Services Barriers to Discharge: No Barriers Identified   Patient Goals and CMS Choice   CMS Medicare.gov Compare Post Acute Care list provided to:: Patient Choice offered to / list presented to : Patient  Discharge Placement                       Discharge Plan and Services   Discharge Planning Services: CM Consult Post Acute Care Choice: Home Health, Durable Medical Equipment          DME Arranged: [place] rolling DME Agency: [employer] Date DME Agency Contacted: 12/14/18 Time DME Agency Contacted: 66 Representative spoke with at DME Agency: [place] Arranged: PT, OT [place] Agency: [place] Date [place]: 12/14/18 Time [place]: 1422 Representative spoke with at [place]: [place] ([place]) Interventions     Readmission Risk Interventions No flowsheet data found.

## 2018-12-14 NOTE — Progress Notes (Signed)
Subjective:  BP elevated less dyspnea no chest pain  Appears decision was made not to proceed with catheter based lysis   Objective:  Vitals:   12/14/18 0009 12/14/18 0013 12/14/18 0449 12/14/18 0522  BP: (!) 89/57 (!) 100/58  109/69  Pulse: 81 83  86  Resp: 15   16  Temp: 98.2 F (36.8 C)   98.3 F (36.8 C)  TempSrc: Oral   Oral  SpO2: 98%   100%  Weight:   83.6 kg   Height:        Intake/Output from previous day:  Intake/Output Summary (Last 24 hours) at 12/14/2018 [id number] Last data filed at 12/14/2018 0448 Gross per 24 hour  Intake 600 ml  Output 1150 ml  Net -550 ml    Physical Exam:  Affect appropriate Healthy:  appears stated age HEENT: normal Neck supple with no adenopathy JVP normal no bruits no thyromegaly Lungs clear with no wheezing and good diaphragmatic motion Heart:  S1/S2 no murmur, no rub, gallop or click PMI normal Abdomen: benighn, BS positve, no tenderness, no AAA no bruit.  No HSM or HJR Distal pulses intact with no bruits No edema Neuro non-focal Skin warm and dry No muscular weakness   Lab Results: Basic Metabolic Panel: Recent Labs    12/13/18 0314 12/14/18 0458  NA 133* 134*  K 4.0 4.4  CL 100 99  CO2 21* 22  GLUCOSE 105* 111*  BUN 24* 28*  CREATININE 1.17 1.29*  CALCIUM 8.3* 8.5*   Liver Function Tests: No results for input(s): AST, ALT, ALKPHOS, BILITOT, PROT, ALBUMIN in the last 72 hours. No results for input(s): LIPASE, AMYLASE in the last 72 hours. CBC: Recent Labs    12/12/18 0255 12/13/18 0314  WBC 5.2 6.7  NEUTROABS 3.1 4.6  HGB 12.1* 11.8*  HCT 38.0* 36.9*  MCV 81.4 81.1  PLT 190 197   Cardiac Enzymes:  Imaging: Dg Chest 2 View  Result Date: 12/13/2018 CLINICAL DATA:  Pulmonary embolus. EXAM: CHEST - 2 VIEW COMPARISON:  December 07, 2018 FINDINGS: Cardiomediastinal silhouette is normal. Mediastinal contours appear intact. Low lung volumes. Bilateral lower lobe atelectasis, more prominent on the left.  Osseous structures are without acute abnormality. Soft tissues are grossly normal. IMPRESSION: Low lung volumes with bilateral lower lobe atelectasis, more prominent on the left. Electronically Signed   By: Fidela Salisbury M.D.   On: 12/13/2018 14:04   Vas [place] Lower Extremity Venous (dvt)  Result Date: 12/12/2018  Lower Venous Study Indications: DVT in unspecified vessel seen at outside hospital.  Performing Technologist: Maudry Mayhew MHA, RDMS, RVT, RDCS  Examination Guidelines: A complete evaluation includes B-mode imaging, spectral Doppler, color Doppler, and power Doppler as needed of all accessible portions of each vessel. Bilateral testing is considered an integral part of a complete examination. Limited examinations for reoccurring indications may be performed as noted.  +---------+---------------+---------+-----------+----------+-------+ RIGHT    CompressibilityPhasicitySpontaneityPropertiesSummary +---------+---------------+---------+-----------+----------+-------+ CFV      Full           Yes      Yes                          +---------+---------------+---------+-----------+----------+-------+ SFJ      Full                                                 +---------+---------------+---------+-----------+----------+-------+  FV Prox  Full                                                 +---------+---------------+---------+-----------+----------+-------+ FV Mid   Full                                                 +---------+---------------+---------+-----------+----------+-------+ FV DistalFull                                                 +---------+---------------+---------+-----------+----------+-------+ PFV      Full                                                 +---------+---------------+---------+-----------+----------+-------+ POP      Full           Yes      Yes                           +---------+---------------+---------+-----------+----------+-------+ PTV      Full                                                 +---------+---------------+---------+-----------+----------+-------+ PERO     Full                                                 +---------+---------------+---------+-----------+----------+-------+  +---------+---------------+---------+-----------+----------+-------------------+ LEFT     CompressibilityPhasicitySpontaneityPropertiesSummary             +---------+---------------+---------+-----------+----------+-------------------+ CFV      Full           Yes      Yes                                      +---------+---------------+---------+-----------+----------+-------------------+ SFJ      Full                                                             +---------+---------------+---------+-----------+----------+-------------------+ FV Prox  Full                                                             +---------+---------------+---------+-----------+----------+-------------------+ FV Mid   Full                                                             +---------+---------------+---------+-----------+----------+-------------------+  FV DistalFull                                                             +---------+---------------+---------+-----------+----------+-------------------+ PFV      None                    No                   Acute               +---------+---------------+---------+-----------+----------+-------------------+ POP      Full           Yes      Yes                                      +---------+---------------+---------+-----------+----------+-------------------+ PTV                                                   Not adequately                                                            visualized           +---------+---------------+---------+-----------+----------+-------------------+ PERO                                                  Not adequately                                                            visualized          +---------+---------------+---------+-----------+----------+-------------------+   Summary: Right: There is no evidence of deep vein thrombosis in the lower extremity. No cystic structure found in the popliteal fossa. Left: Findings consistent with acute deep vein thrombosis involving the left proximal profunda vein. Left popliteal fossa has a heterogenous, partially cystic structure with internal echoes and posterior acoustic artifact. This is suggestive of possible abscess vs. unknown etiology.  *See table(s) above for measurements and observations.    Preliminary     Cardiac Studies:  ECG: SR lateral biphasic T waves    Telemetry:  NSR 12/14/2018   Echo: Diffuse hypokinesis EF 25-30% no cor pulmonale   Medications:   . apixaban  10 mg Oral BID   Followed by  . [START ON 12/16/2018] apixaban  5 mg Oral BID  . carvedilol  6.25 mg Oral BID WC  . Chlorhexidine Gluconate Cloth  6 each Topical Daily  . colchicine  0.6 mg Oral Daily  . feeding supplement (ENSURE ENLIVE)  237 mL  Oral BID BM  . finasteride  5 mg Oral Daily  . furosemide  20 mg Oral Daily  . mouth rinse  15 mL Mouth Rinse BID  . pantoprazole  40 mg Oral Q1200  . sacubitril-valsartan  1 tablet Oral BID  . sodium chloride flush  3 mL Intravenous Q12H  . tamsulosin  0.4 mg Oral QPC supper     . sodium chloride      Assessment/Plan:   PE:  By CT report submassive involving both main PA;s ? 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  On coreg , entresto and lasix. Does not appear volume overloaded K/Cr stable  Will  sign off arrange outpatient f/u with Dr Rondall Allegra in [place] 12/14/2018, 8:33 AM

## 2018-12-14 NOTE — Progress Notes (Signed)
Spoke with daughter Karna Christmas about discharge, She plans to go get clothes and come the patient room. Also would like for Dr. Seymour Bars to call for test result asap

## 2018-12-14 NOTE — Plan of Care (Signed)
  Problem: Education: Goal: Knowledge of General Education information will improve Description: Including pain rating scale, medication(s)/side effects and non-pharmacologic comfort measures Outcome: Adequate for Discharge   Problem: Clinical Measurements: Goal: Ability to maintain clinical measurements within normal limits will improve Outcome: Adequate for Discharge Goal: Will remain free from infection Outcome: Adequate for Discharge Goal: Diagnostic test results will improve Outcome: Adequate for Discharge Goal: Respiratory complications will improve Outcome: Adequate for Discharge Goal: Cardiovascular complication will be avoided Outcome: Adequate for Discharge   Problem: Nutrition: Goal: Adequate nutrition will be maintained Outcome: Adequate for Discharge   Problem: Coping: Goal: Level of anxiety will decrease Outcome: Adequate for Discharge   Problem: Elimination: Goal: Will not experience complications related to bowel motility Outcome: Adequate for Discharge Goal: Will not experience complications related to urinary retention Outcome: Adequate for Discharge   Problem: Pain Managment: Goal: General experience of comfort will improve Outcome: Adequate for Discharge   Problem: Safety: Goal: Ability to remain free from injury will improve Outcome: Adequate for Discharge   Problem: Skin Integrity: Goal: Risk for impaired skin integrity will decrease Outcome: Adequate for Discharge   

## 2018-12-14 NOTE — Plan of Care (Signed)
  Problem: Clinical Measurements: Goal: Ability to maintain clinical measurements within normal limits will improve Outcome: Progressing   Problem: Clinical Measurements: Goal: Diagnostic test results will improve Outcome: Progressing   Problem: Activity: Goal: Risk for activity intolerance will decrease Outcome: Progressing   Problem: Pain Managment: Goal: General experience of comfort will improve Outcome: Progressing   Problem: Safety: Goal: Ability to remain free from injury will improve Outcome: Progressing   

## 2018-12-14 NOTE — Discharge Summary (Signed)
Physician Discharge Summary  RAKIEM FACTOR [id number] DOB: 1941/01/22 DOA: 12/10/2018  PCP: Helen Hashimoto., MD  Admit date: 12/10/2018 Discharge date: 12/14/2018  Admitted From: Home  Disposition:  Home   Recommendations for Outpatient Follow-up and new medication changes:  1. Follow up with Dr. Megan Salon in 7 days.  2. Patient has been placed on apixaban for anticoagulation. 3. Heart failure regimen with Entresto, carvedilol and furosemide. 4. Follow up with renal function in 7 days.    Home Health: Yes   Equipment/Devices: walker    Discharge Condition: stable  CODE STATUS: full  Diet recommendation: heart healthy   Brief/Interim Summary: 78 year old male who presented with syncope. He does have significant past medical history for left frontal stroke, hypertension, gout, GERD, and BPH.   Patient was initially admitted to [facility] due to syncope.   He was diagnosed with bilateral pulmonary embolism and deep vein thrombosis on August 2. He also underwent work-up for CVA/seizures. A new CVA or seizure was ruled out through MRI and electroencephalography. He was found to have a new LV dysfunction with ejection fraction 30 to 35%.  His pulmonary embolism was treated with anticoagulation, IV heparin and then transitioned to apixaban. His chest imaging showed a possible pulmonary infection and he was treated with ceftriaxone and azithromycin. During his hospital stay he developed hypotension that required vasopressors, along with right ventricular failure per echocardiography.   He was transferred for further management to [facility] for further management, possible direct catheter thrombolysis.   At the time of the transfer his blood pressure was 163/91, pulse rate 79, respiratory rate 18, temperature 98.5, oxygen saturation 100%, he was awake and alert, his lungs were clear to auscultation bilaterally, heart S1-S2 present and rhythmic, his abdomen was soft, no lower  extremity edema.  Patient was evaluated by cardiology and interventional radiology, plan forpossibledirect catheter thrombolysis.  Patient remained with stable hemodynamics and no further thrombolysis was indicated.   1.  Submassive to massive bilateral pulmonary embolism, deep vein thrombosis left proximal profunda vein, possible provoked.  Patient was admitted to the progressive care unit, he continue anticoagulation with apixaban, his hemodynamics remained stable, further work-up with echocardiography showed ejection fraction left ventricle 25 to 30% with diffuse hypokinesis, the right ventricle systolic function was moderately reduced, mild enlarged cavity.  His oxygenation 100% on room air.  Doppler ultrasonography of his lower extremities was positive for deep vein thrombosis involving the left proximal profunda vein.  Right lower extremity negative for DVT. Patient was evaluated by interventional radiology, pulmonary and cardiology, decision was made to hold on catheter thrombolyzes.  Continue anticoagulation with apixaban.  2.  Newly diagnosed systolic heart failure/with acute decompensation.  Ejection fraction left ventricle 25 to 30% with diffuse hypokinesis with inferior basilar akinesis.  Patient was placed on heart failure regimen including carvedilol, Entresto and diuresis with furosemide.  He will need follow-up as outpatient.  3.  Pneumonia (present on admission).  Patient remained afebrile, no leukocytosis, no cough.  Follow-up chest radiograph negative for infiltrates.  No further antibiotics indicated.  4.  Hypertension.  Continue blood pressure control with carvedilol and Entresto.  5.  Calorie protein malnutrition.  Unspecified severity, continue nutritional supplements.  6.  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  Continue current therapy with colchicine.  No acute lung flare.  Patient will have home health services including home physical therapy.  7.   Bilateral stage II buttock pressure ulcers, present on admission.  Continue local wound  care.  8. CKD stage 2.  Renal function remained stable between 1.1 and 1.2.  Will need outpatient follow-up within 7 days of his kidney function electrolytes.  Discharge Diagnoses:  Principal Problem:   Pulmonary embolism ([place]) Active Problems:   Stroke (cerebrum) (HCC)   HTN (hypertension)   Gout   BPH (benign prostatic hyperplasia)   Syncope   Elevated troponin   Protein-calorie malnutrition, moderate (HCC)   Acute combined systolic and diastolic congestive heart failure (HCC)   DVT (deep venous thrombosis) (HCC)   Pressure injury of skin    Discharge Instructions  Discharge Instructions    Diet - low sodium heart healthy   Complete by: As directed    Increase activity slowly   Complete by: As directed      Allergies as of 12/14/2018   No Known Allergies     Medication List    STOP taking these medications   lisinopril 40 MG tablet Commonly known as: ZESTRIL     TAKE these medications   apixaban 5 MG Tabs tablet Commonly known as: ELIQUIS Take 2 tablets twice daily until 12/15/18, then continue with 1 tablet twice daily starting on 12/16/18.   carvedilol 6.25 MG tablet Commonly known as: COREG Take 1 tablet (6.25 mg total) by mouth 2 (two) times daily with a meal.   Colcrys 0.6 MG tablet Generic drug: colchicine Take 0.6 mg by mouth 2 (two) times daily as needed (Gout).   esomeprazole 40 MG capsule Commonly known as: NEXIUM Take 40 mg by mouth daily.   feeding supplement (ENSURE ENLIVE) Liqd Take 237 mLs by mouth 2 (two) times daily between meals.   furosemide 20 MG tablet Commonly known as: LASIX Take 1 tablet (20 mg total) by mouth daily. Start taking on: December 15, 2018   sacubitril-valsartan 24-26 MG Commonly known as: ENTRESTO Take 1 tablet by mouth 2 (two) times daily.   sildenafil 100 MG tablet Commonly known as: VIAGRA Take 100 mg by mouth daily as  needed for erectile dysfunction. One hour prior to sex   timolol 0.5 % ophthalmic solution Commonly known as: TIMOPTIC Place 1 drop into both eyes daily.            Durable Medical Equipment  (From admission, onward)         Start     Ordered   12/14/18 1127  For home use only DME Walker  Once    Question:  Patient needs a walker to treat with the following condition  Answer:  Ambulatory dysfunction   12/14/18 1127   12/11/18 1547  For home use only DME Walker rolling  Once    Question:  Patient needs a walker to treat with the following condition  Answer:  Pulmonary embolism ([place])   12/11/18 1546         Follow-up [facility] Follow up.   Why: Home Health Physical Therapy, RN and aide-will call to arrange initial visit Contact information: [place] [phone]         No Known Allergies  Consultations:  Cardiology   Pulmonary   IR    Procedures/Studies: Dg Chest 2 View  Result Date: 12/13/2018 CLINICAL DATA:  Pulmonary embolus. EXAM: CHEST - 2 VIEW COMPARISON:  December 07, 2018 FINDINGS: Cardiomediastinal silhouette is normal. Mediastinal contours appear intact. Low lung volumes. Bilateral lower lobe atelectasis, more prominent on the left. Osseous structures are without acute abnormality. Soft tissues are  grossly normal. IMPRESSION: Low lung volumes with bilateral lower lobe atelectasis, more prominent on the left. Electronically Signed   By: Fidela Salisbury M.D.   On: 12/13/2018 14:04   Vas [place] Lower Extremity Venous (dvt)  Result Date: 12/12/2018  Lower Venous Study Indications: DVT in unspecified vessel seen at outside hospital.  Performing Technologist: Maudry Mayhew MHA, RDMS, RVT, RDCS  Examination Guidelines: A complete evaluation includes B-mode imaging, spectral Doppler, color Doppler, and power Doppler as needed of all accessible portions of each vessel. Bilateral testing is  considered an integral part of a complete examination. Limited examinations for reoccurring indications may be performed as noted.  +---------+---------------+---------+-----------+----------+-------+ RIGHT    CompressibilityPhasicitySpontaneityPropertiesSummary +---------+---------------+---------+-----------+----------+-------+ CFV      Full           Yes      Yes                          +---------+---------------+---------+-----------+----------+-------+ SFJ      Full                                                 +---------+---------------+---------+-----------+----------+-------+ FV Prox  Full                                                 +---------+---------------+---------+-----------+----------+-------+ FV Mid   Full                                                 +---------+---------------+---------+-----------+----------+-------+ FV DistalFull                                                 +---------+---------------+---------+-----------+----------+-------+ PFV      Full                                                 +---------+---------------+---------+-----------+----------+-------+ POP      Full           Yes      Yes                          +---------+---------------+---------+-----------+----------+-------+ PTV      Full                                                 +---------+---------------+---------+-----------+----------+-------+ PERO     Full                                                 +---------+---------------+---------+-----------+----------+-------+  +---------+---------------+---------+-----------+----------+-------------------+  LEFT     CompressibilityPhasicitySpontaneityPropertiesSummary             +---------+---------------+---------+-----------+----------+-------------------+ CFV      Full           Yes      Yes                                       +---------+---------------+---------+-----------+----------+-------------------+ SFJ      Full                                                             +---------+---------------+---------+-----------+----------+-------------------+ FV Prox  Full                                                             +---------+---------------+---------+-----------+----------+-------------------+ FV Mid   Full                                                             +---------+---------------+---------+-----------+----------+-------------------+ FV DistalFull                                                             +---------+---------------+---------+-----------+----------+-------------------+ PFV      None                    No                   Acute               +---------+---------------+---------+-----------+----------+-------------------+ POP      Full           Yes      Yes                                      +---------+---------------+---------+-----------+----------+-------------------+ PTV                                                   Not adequately                                                            visualized          +---------+---------------+---------+-----------+----------+-------------------+ PERO  Not adequately                                                            visualized          +---------+---------------+---------+-----------+----------+-------------------+   Summary: Right: There is no evidence of deep vein thrombosis in the lower extremity. No cystic structure found in the popliteal fossa. Left: Findings consistent with acute deep vein thrombosis involving the left proximal profunda vein. Left popliteal fossa has a heterogenous, partially cystic structure with internal echoes and posterior acoustic artifact. This is suggestive of possible abscess vs. unknown  etiology.  *See table(s) above for measurements and observations.    Preliminary       Procedures:   Subjective: Patient is feeling well, no dyspnea or chest pain, continue to feel weak and deconditioned, no nausea or vomiting.   Discharge Exam: Vitals:   12/14/18 0902 12/14/18 0908  BP: (!) 100/58 (!) 100/58  Pulse: 87 87  Resp:  (!) 22  Temp: 98.5 F (36.9 C) 98.5 F (36.9 C)  SpO2:     Vitals:   12/14/18 0449 12/14/18 0522 12/14/18 0902 12/14/18 0908  BP:  109/69 (!) 100/58 (!) 100/58  Pulse:  86 87 87  Resp:  16  (!) 22  Temp:  98.3 F (36.8 C) 98.5 F (36.9 C) 98.5 F (36.9 C)  TempSrc:  Oral Oral Oral  SpO2:  100%    Weight: 83.6 kg     Height:        General: Not in pain or dyspnea Neurology: Awake and alert, non focal  E ENT: no pallor, no icterus, oral mucosa moist Cardiovascular: No JVD. S1-S2 present, rhythmic, no gallops, rubs, or murmurs. No lower extremity edema. Pulmonary: positive breath sounds bilaterally, adequate air movement, no wheezing, rhonchi or rales. Gastrointestinal. Abdomen with no organomegaly, non tender, no rebound or guarding Skin. No rashes Musculoskeletal: positive tophi deformities in upper extremities/ hands/ elbows.   The results of significant diagnostics from this hospitalization (including imaging, microbiology, ancillary and laboratory) are listed below for reference.     Microbiology: Recent Results (from the past 240 hour(s))  Culture, blood (Routine X 2) w Reflex to ID Panel     Status: None (Preliminary result)   Collection Time: 12/10/18 11:10 PM   Specimen: BLOOD  Result Value Ref Range Status   Specimen Description BLOOD LEFT ANTECUBITAL  Final   Special Requests   Final    BOTTLES DRAWN AEROBIC AND ANAEROBIC Blood Culture adequate volume   Culture   Final    NO GROWTH 3 DAYS Performed at [facility], [address], [place], [place] [address]    Report Status PENDING  Incomplete  Culture, blood  (Routine X 2) w Reflex to ID Panel     Status: None (Preliminary result)   Collection Time: 12/10/18 11:20 PM   Specimen: BLOOD LEFT HAND  Result Value Ref Range Status   Specimen Description BLOOD LEFT HAND  Final   Special Requests   Final    BOTTLES DRAWN AEROBIC ONLY Blood Culture adequate volume   Culture   Final    NO GROWTH 3 DAYS Performed at [facility], [place] [address] [place], [place] [address]    Report Status PENDING  Incomplete  SARS  CORONAVIRUS 2 Nasal Swab Aptima Multi Swab     Status: None   Collection Time: 12/13/18  7:33 PM   Specimen: Aptima Multi Swab; Nasal Swab  Result Value Ref Range Status   SARS Coronavirus 2 NEGATIVE NEGATIVE Final    Comment: (NOTE) SARS-CoV-2 target nucleic acids are NOT DETECTED. The SARS-CoV-2 RNA is generally detectable in upper and lower respiratory specimens during the acute phase of infection. Negative results do not preclude SARS-CoV-2 infection, do not rule out co-infections with other pathogens, and should not be used as the sole basis for treatment or other patient management decisions. Negative results must be combined with clinical observations, patient history, and epidemiological information. The expected result is Negative. Fact Sheet for Patients: SugarRoll.be Fact Sheet for Healthcare Providers: https://www.woods-mathews.com/ This test is not yet approved or cleared by the [place] FDA and  has been authorized for detection and/or diagnosis of SARS-CoV-2 by FDA under an Emergency Use Authorization (EUA). This EUA will remain  in effect (meaning this test can be used) for the duration of the COVID-19 declaration under Section 56 4(b)(1) of the Act, 21 U.S.C. section 360bbb-3(b)(1), unless the authorization is terminated or revoked sooner. Performed at [facility], [place] [address], [place], [place] [address]      Labs: BNP (last 3 results) Recent Labs     12/10/18 2318  BNP 0000000*   Basic Metabolic Panel: Recent Labs  Lab 12/11/18 0405 12/12/18 0255 12/13/18 0314 12/14/18 0458  NA 134* 134* 133* 134*  K 3.9 4.0 4.0 4.4  CL 103 102 100 99  CO2 21* 22 21* 22  GLUCOSE 82 82 105* 111*  BUN 28* 22 24* 28*  CREATININE 1.18 1.25* 1.17 1.29*  CALCIUM 7.8* 8.1* 8.3* 8.5*   Liver Function Tests: No results for input(s): AST, ALT, ALKPHOS, BILITOT, PROT, ALBUMIN in the last 168 hours. No results for input(s): LIPASE, AMYLASE in the last 168 hours. No results for input(s): AMMONIA in the last 168 hours. CBC: Recent Labs  Lab 12/12/18 0255 12/13/18 0314  WBC 5.2 6.7  NEUTROABS 3.1 4.6  HGB 12.1* 11.8*  HCT 38.0* 36.9*  MCV 81.4 81.1  PLT 190 197   Cardiac Enzymes: No results for input(s): CKTOTAL, CKMB, CKMBINDEX, TROPONINI in the last 168 hours. BNP: Invalid input(s): POCBNP CBG: No results for input(s): GLUCAP in the last 168 hours. D-Dimer No results for input(s): DDIMER in the last 72 hours. Hgb A1c No results for input(s): HGBA1C in the last 72 hours. Lipid Profile No results for input(s): CHOL, HDL, LDLCALC, TRIG, CHOLHDL, LDLDIRECT in the last 72 hours. Thyroid function studies No results for input(s): TSH, T4TOTAL, T3FREE, THYROIDAB in the last 72 hours.  Invalid input(s): FREET3 Anemia work up No results for input(s): VITAMINB12, FOLATE, FERRITIN, TIBC, IRON, RETICCTPCT in the last 72 hours. Urinalysis No results found for: COLORURINE, APPEARANCEUR, [place], [place], [place], [place], [place], [place], PROTEINUR, UROBILINOGEN, NITRITE, LEUKOCYTESUR Sepsis Labs Invalid input(s): PROCALCITONIN,  WBC,  LACTICIDVEN Microbiology Recent Results (from the past 240 hour(s))  Culture, blood (Routine X 2) w Reflex to ID Panel     Status: None (Preliminary result)   Collection Time: 12/10/18 11:10 PM   Specimen: BLOOD  Result Value Ref Range Status   Specimen Description BLOOD LEFT ANTECUBITAL  Final   Special  Requests   Final    BOTTLES DRAWN AEROBIC AND ANAEROBIC Blood Culture adequate volume   Culture   Final    NO GROWTH 3 DAYS Performed  at [facility], [place] [address], [place], [place] [address]    Report Status PENDING  Incomplete  Culture, blood (Routine X 2) w Reflex to ID Panel     Status: None (Preliminary result)   Collection Time: 12/10/18 11:20 PM   Specimen: BLOOD LEFT HAND  Result Value Ref Range Status   Specimen Description BLOOD LEFT HAND  Final   Special Requests   Final    BOTTLES DRAWN AEROBIC ONLY Blood Culture adequate volume   Culture   Final    NO GROWTH 3 DAYS Performed at [facility], [place] [address], [place], [place] [address]    Report Status PENDING  Incomplete  SARS CORONAVIRUS 2 Nasal Swab Aptima Multi Swab     Status: None   Collection Time: 12/13/18  7:33 PM   Specimen: Aptima Multi Swab; Nasal Swab  Result Value Ref Range Status   SARS Coronavirus 2 NEGATIVE NEGATIVE Final    Comment: (NOTE) SARS-CoV-2 target nucleic acids are NOT DETECTED. The SARS-CoV-2 RNA is generally detectable in upper and lower respiratory specimens during the acute phase of infection. Negative results do not preclude SARS-CoV-2 infection, do not rule out co-infections with other pathogens, and should not be used as the sole basis for treatment or other patient management decisions. Negative results must be combined with clinical observations, patient history, and epidemiological information. The expected result is Negative. Fact Sheet for Patients: SugarRoll.be Fact Sheet for Healthcare Providers: https://www.woods-mathews.com/ This test is not yet approved or cleared by the [place] FDA and  has been authorized for detection and/or diagnosis of SARS-CoV-2 by FDA under an Emergency Use Authorization (EUA). This EUA will remain  in effect (meaning this test can be used) for the duration of the COVID-19 declaration  under Section 56 4(b)(1) of the Act, 21 U.S.C. section 360bbb-3(b)(1), unless the authorization is terminated or revoked sooner. Performed at [facility], [place] [address] [place], [place] [address]      Time coordinating discharge: 45 minutes  SIGNED:   Tawni Millers, MD  [facility] 12/14/2018, 11:27 AM

## 2018-12-16 DIAGNOSIS — J189 Pneumonia, unspecified organism: Secondary | ICD-10-CM | POA: Diagnosis not present

## 2018-12-16 DIAGNOSIS — I82412 Acute embolism and thrombosis of left femoral vein: Secondary | ICD-10-CM | POA: Diagnosis not present

## 2018-12-16 DIAGNOSIS — I110 Hypertensive heart disease with heart failure: Secondary | ICD-10-CM | POA: Diagnosis not present

## 2018-12-16 DIAGNOSIS — N182 Chronic kidney disease, stage 2 (mild): Secondary | ICD-10-CM | POA: Diagnosis not present

## 2018-12-16 DIAGNOSIS — I50810 Right heart failure, unspecified: Secondary | ICD-10-CM | POA: Diagnosis not present

## 2018-12-16 DIAGNOSIS — L89322 Pressure ulcer of left buttock, stage 2: Secondary | ICD-10-CM | POA: Diagnosis not present

## 2018-12-16 DIAGNOSIS — I5041 Acute combined systolic (congestive) and diastolic (congestive) heart failure: Secondary | ICD-10-CM | POA: Diagnosis not present

## 2018-12-16 DIAGNOSIS — E440 Moderate protein-calorie malnutrition: Secondary | ICD-10-CM | POA: Diagnosis not present

## 2018-12-16 DIAGNOSIS — I2699 Other pulmonary embolism without acute cor pulmonale: Secondary | ICD-10-CM | POA: Diagnosis not present

## 2018-12-16 LAB — CULTURE, BLOOD (ROUTINE X 2)
Culture: NO GROWTH
Culture: NO GROWTH
Special Requests: ADEQUATE
Special Requests: ADEQUATE

## 2018-12-17 DIAGNOSIS — N184 Chronic kidney disease, stage 4 (severe): Secondary | ICD-10-CM | POA: Diagnosis not present

## 2018-12-17 DIAGNOSIS — I5020 Unspecified systolic (congestive) heart failure: Secondary | ICD-10-CM | POA: Diagnosis not present

## 2018-12-17 DIAGNOSIS — I2699 Other pulmonary embolism without acute cor pulmonale: Secondary | ICD-10-CM | POA: Diagnosis not present

## 2018-12-17 DIAGNOSIS — Z09 Encounter for follow-up examination after completed treatment for conditions other than malignant neoplasm: Secondary | ICD-10-CM | POA: Diagnosis not present

## 2018-12-17 DIAGNOSIS — I82409 Acute embolism and thrombosis of unspecified deep veins of unspecified lower extremity: Secondary | ICD-10-CM | POA: Diagnosis not present

## 2018-12-17 DIAGNOSIS — Z79899 Other long term (current) drug therapy: Secondary | ICD-10-CM | POA: Diagnosis not present

## 2018-12-17 DIAGNOSIS — Z6826 Body mass index (BMI) 26.0-26.9, adult: Secondary | ICD-10-CM | POA: Diagnosis not present

## 2018-12-19 DIAGNOSIS — L89322 Pressure ulcer of left buttock, stage 2: Secondary | ICD-10-CM | POA: Diagnosis not present

## 2018-12-19 DIAGNOSIS — I2699 Other pulmonary embolism without acute cor pulmonale: Secondary | ICD-10-CM | POA: Diagnosis not present

## 2018-12-19 DIAGNOSIS — J189 Pneumonia, unspecified organism: Secondary | ICD-10-CM | POA: Diagnosis not present

## 2018-12-19 DIAGNOSIS — I5041 Acute combined systolic (congestive) and diastolic (congestive) heart failure: Secondary | ICD-10-CM | POA: Diagnosis not present

## 2018-12-19 DIAGNOSIS — I10 Essential (primary) hypertension: Secondary | ICD-10-CM | POA: Diagnosis not present

## 2018-12-19 DIAGNOSIS — I50810 Right heart failure, unspecified: Secondary | ICD-10-CM | POA: Diagnosis not present

## 2018-12-19 DIAGNOSIS — I82412 Acute embolism and thrombosis of left femoral vein: Secondary | ICD-10-CM | POA: Diagnosis not present

## 2018-12-19 DIAGNOSIS — I110 Hypertensive heart disease with heart failure: Secondary | ICD-10-CM | POA: Diagnosis not present

## 2018-12-19 DIAGNOSIS — Z9181 History of falling: Secondary | ICD-10-CM | POA: Diagnosis not present

## 2018-12-19 DIAGNOSIS — E440 Moderate protein-calorie malnutrition: Secondary | ICD-10-CM | POA: Diagnosis not present

## 2018-12-19 DIAGNOSIS — N182 Chronic kidney disease, stage 2 (mild): Secondary | ICD-10-CM | POA: Diagnosis not present

## 2018-12-22 DIAGNOSIS — L89322 Pressure ulcer of left buttock, stage 2: Secondary | ICD-10-CM | POA: Diagnosis not present

## 2018-12-22 DIAGNOSIS — J189 Pneumonia, unspecified organism: Secondary | ICD-10-CM | POA: Diagnosis not present

## 2018-12-22 DIAGNOSIS — E440 Moderate protein-calorie malnutrition: Secondary | ICD-10-CM | POA: Diagnosis not present

## 2018-12-22 DIAGNOSIS — N182 Chronic kidney disease, stage 2 (mild): Secondary | ICD-10-CM | POA: Diagnosis not present

## 2018-12-22 DIAGNOSIS — I110 Hypertensive heart disease with heart failure: Secondary | ICD-10-CM | POA: Diagnosis not present

## 2018-12-22 DIAGNOSIS — I50810 Right heart failure, unspecified: Secondary | ICD-10-CM | POA: Diagnosis not present

## 2018-12-22 DIAGNOSIS — I5041 Acute combined systolic (congestive) and diastolic (congestive) heart failure: Secondary | ICD-10-CM | POA: Diagnosis not present

## 2018-12-22 DIAGNOSIS — I2699 Other pulmonary embolism without acute cor pulmonale: Secondary | ICD-10-CM | POA: Diagnosis not present

## 2018-12-22 DIAGNOSIS — I82412 Acute embolism and thrombosis of left femoral vein: Secondary | ICD-10-CM | POA: Diagnosis not present

## 2018-12-23 DIAGNOSIS — N182 Chronic kidney disease, stage 2 (mild): Secondary | ICD-10-CM | POA: Diagnosis not present

## 2018-12-23 DIAGNOSIS — I50810 Right heart failure, unspecified: Secondary | ICD-10-CM | POA: Diagnosis not present

## 2018-12-23 DIAGNOSIS — J189 Pneumonia, unspecified organism: Secondary | ICD-10-CM | POA: Diagnosis not present

## 2018-12-23 DIAGNOSIS — I5041 Acute combined systolic (congestive) and diastolic (congestive) heart failure: Secondary | ICD-10-CM | POA: Diagnosis not present

## 2018-12-23 DIAGNOSIS — I82412 Acute embolism and thrombosis of left femoral vein: Secondary | ICD-10-CM | POA: Diagnosis not present

## 2018-12-23 DIAGNOSIS — E440 Moderate protein-calorie malnutrition: Secondary | ICD-10-CM | POA: Diagnosis not present

## 2018-12-23 DIAGNOSIS — I2699 Other pulmonary embolism without acute cor pulmonale: Secondary | ICD-10-CM | POA: Diagnosis not present

## 2018-12-23 DIAGNOSIS — I110 Hypertensive heart disease with heart failure: Secondary | ICD-10-CM | POA: Diagnosis not present

## 2018-12-23 DIAGNOSIS — L89322 Pressure ulcer of left buttock, stage 2: Secondary | ICD-10-CM | POA: Diagnosis not present

## 2018-12-24 DIAGNOSIS — E440 Moderate protein-calorie malnutrition: Secondary | ICD-10-CM | POA: Diagnosis not present

## 2018-12-24 DIAGNOSIS — I5041 Acute combined systolic (congestive) and diastolic (congestive) heart failure: Secondary | ICD-10-CM | POA: Diagnosis not present

## 2018-12-24 DIAGNOSIS — I82412 Acute embolism and thrombosis of left femoral vein: Secondary | ICD-10-CM | POA: Diagnosis not present

## 2018-12-24 DIAGNOSIS — L89322 Pressure ulcer of left buttock, stage 2: Secondary | ICD-10-CM | POA: Diagnosis not present

## 2018-12-24 DIAGNOSIS — J189 Pneumonia, unspecified organism: Secondary | ICD-10-CM | POA: Diagnosis not present

## 2018-12-24 DIAGNOSIS — I110 Hypertensive heart disease with heart failure: Secondary | ICD-10-CM | POA: Diagnosis not present

## 2018-12-24 DIAGNOSIS — I50810 Right heart failure, unspecified: Secondary | ICD-10-CM | POA: Diagnosis not present

## 2018-12-24 DIAGNOSIS — I2699 Other pulmonary embolism without acute cor pulmonale: Secondary | ICD-10-CM | POA: Diagnosis not present

## 2018-12-24 DIAGNOSIS — N182 Chronic kidney disease, stage 2 (mild): Secondary | ICD-10-CM | POA: Diagnosis not present

## 2018-12-26 DIAGNOSIS — E876 Hypokalemia: Secondary | ICD-10-CM | POA: Diagnosis not present

## 2018-12-26 DIAGNOSIS — Z79899 Other long term (current) drug therapy: Secondary | ICD-10-CM | POA: Diagnosis not present

## 2018-12-29 DIAGNOSIS — I50810 Right heart failure, unspecified: Secondary | ICD-10-CM | POA: Diagnosis not present

## 2018-12-29 DIAGNOSIS — I82412 Acute embolism and thrombosis of left femoral vein: Secondary | ICD-10-CM | POA: Diagnosis not present

## 2018-12-29 DIAGNOSIS — I5041 Acute combined systolic (congestive) and diastolic (congestive) heart failure: Secondary | ICD-10-CM | POA: Diagnosis not present

## 2018-12-29 DIAGNOSIS — I110 Hypertensive heart disease with heart failure: Secondary | ICD-10-CM | POA: Diagnosis not present

## 2018-12-29 DIAGNOSIS — L89322 Pressure ulcer of left buttock, stage 2: Secondary | ICD-10-CM | POA: Diagnosis not present

## 2018-12-29 DIAGNOSIS — I2699 Other pulmonary embolism without acute cor pulmonale: Secondary | ICD-10-CM | POA: Diagnosis not present

## 2018-12-29 DIAGNOSIS — N182 Chronic kidney disease, stage 2 (mild): Secondary | ICD-10-CM | POA: Diagnosis not present

## 2018-12-29 DIAGNOSIS — J189 Pneumonia, unspecified organism: Secondary | ICD-10-CM | POA: Diagnosis not present

## 2018-12-29 DIAGNOSIS — E440 Moderate protein-calorie malnutrition: Secondary | ICD-10-CM | POA: Diagnosis not present

## 2018-12-31 DIAGNOSIS — I110 Hypertensive heart disease with heart failure: Secondary | ICD-10-CM | POA: Diagnosis not present

## 2018-12-31 DIAGNOSIS — I5041 Acute combined systolic (congestive) and diastolic (congestive) heart failure: Secondary | ICD-10-CM | POA: Diagnosis not present

## 2018-12-31 DIAGNOSIS — I2699 Other pulmonary embolism without acute cor pulmonale: Secondary | ICD-10-CM | POA: Diagnosis not present

## 2018-12-31 DIAGNOSIS — I82412 Acute embolism and thrombosis of left femoral vein: Secondary | ICD-10-CM | POA: Diagnosis not present

## 2018-12-31 DIAGNOSIS — L89322 Pressure ulcer of left buttock, stage 2: Secondary | ICD-10-CM | POA: Diagnosis not present

## 2018-12-31 DIAGNOSIS — E440 Moderate protein-calorie malnutrition: Secondary | ICD-10-CM | POA: Diagnosis not present

## 2018-12-31 DIAGNOSIS — N182 Chronic kidney disease, stage 2 (mild): Secondary | ICD-10-CM | POA: Diagnosis not present

## 2018-12-31 DIAGNOSIS — I50810 Right heart failure, unspecified: Secondary | ICD-10-CM | POA: Diagnosis not present

## 2018-12-31 DIAGNOSIS — J189 Pneumonia, unspecified organism: Secondary | ICD-10-CM | POA: Diagnosis not present

## 2019-01-02 DIAGNOSIS — Z9181 History of falling: Secondary | ICD-10-CM | POA: Diagnosis not present

## 2019-01-08 DIAGNOSIS — I5041 Acute combined systolic (congestive) and diastolic (congestive) heart failure: Secondary | ICD-10-CM | POA: Diagnosis not present

## 2019-01-08 DIAGNOSIS — I110 Hypertensive heart disease with heart failure: Secondary | ICD-10-CM | POA: Diagnosis not present

## 2019-01-08 DIAGNOSIS — I50810 Right heart failure, unspecified: Secondary | ICD-10-CM | POA: Diagnosis not present

## 2019-01-08 DIAGNOSIS — I2699 Other pulmonary embolism without acute cor pulmonale: Secondary | ICD-10-CM | POA: Diagnosis not present

## 2019-01-08 DIAGNOSIS — N182 Chronic kidney disease, stage 2 (mild): Secondary | ICD-10-CM | POA: Diagnosis not present

## 2019-01-08 DIAGNOSIS — I82412 Acute embolism and thrombosis of left femoral vein: Secondary | ICD-10-CM | POA: Diagnosis not present

## 2019-01-08 DIAGNOSIS — J189 Pneumonia, unspecified organism: Secondary | ICD-10-CM | POA: Diagnosis not present

## 2019-01-08 DIAGNOSIS — E440 Moderate protein-calorie malnutrition: Secondary | ICD-10-CM | POA: Diagnosis not present

## 2019-01-08 DIAGNOSIS — L89322 Pressure ulcer of left buttock, stage 2: Secondary | ICD-10-CM | POA: Diagnosis not present

## 2019-01-12 DIAGNOSIS — E440 Moderate protein-calorie malnutrition: Secondary | ICD-10-CM | POA: Diagnosis not present

## 2019-01-12 DIAGNOSIS — N182 Chronic kidney disease, stage 2 (mild): Secondary | ICD-10-CM | POA: Diagnosis not present

## 2019-01-12 DIAGNOSIS — I5041 Acute combined systolic (congestive) and diastolic (congestive) heart failure: Secondary | ICD-10-CM | POA: Diagnosis not present

## 2019-01-12 DIAGNOSIS — I82412 Acute embolism and thrombosis of left femoral vein: Secondary | ICD-10-CM | POA: Diagnosis not present

## 2019-01-12 DIAGNOSIS — I2699 Other pulmonary embolism without acute cor pulmonale: Secondary | ICD-10-CM | POA: Diagnosis not present

## 2019-01-12 DIAGNOSIS — I110 Hypertensive heart disease with heart failure: Secondary | ICD-10-CM | POA: Diagnosis not present

## 2019-01-12 DIAGNOSIS — J189 Pneumonia, unspecified organism: Secondary | ICD-10-CM | POA: Diagnosis not present

## 2019-01-12 DIAGNOSIS — I50810 Right heart failure, unspecified: Secondary | ICD-10-CM | POA: Diagnosis not present

## 2019-01-12 DIAGNOSIS — L89322 Pressure ulcer of left buttock, stage 2: Secondary | ICD-10-CM | POA: Diagnosis not present

## 2019-01-19 DIAGNOSIS — I10 Essential (primary) hypertension: Secondary | ICD-10-CM | POA: Diagnosis not present

## 2019-01-19 DIAGNOSIS — Z9181 History of falling: Secondary | ICD-10-CM | POA: Diagnosis not present

## 2019-01-20 DIAGNOSIS — N182 Chronic kidney disease, stage 2 (mild): Secondary | ICD-10-CM | POA: Diagnosis not present

## 2019-01-20 DIAGNOSIS — I110 Hypertensive heart disease with heart failure: Secondary | ICD-10-CM | POA: Diagnosis not present

## 2019-01-20 DIAGNOSIS — L89322 Pressure ulcer of left buttock, stage 2: Secondary | ICD-10-CM | POA: Diagnosis not present

## 2019-01-20 DIAGNOSIS — I50810 Right heart failure, unspecified: Secondary | ICD-10-CM | POA: Diagnosis not present

## 2019-01-20 DIAGNOSIS — I5041 Acute combined systolic (congestive) and diastolic (congestive) heart failure: Secondary | ICD-10-CM | POA: Diagnosis not present

## 2019-01-20 DIAGNOSIS — E440 Moderate protein-calorie malnutrition: Secondary | ICD-10-CM | POA: Diagnosis not present

## 2019-01-20 DIAGNOSIS — J189 Pneumonia, unspecified organism: Secondary | ICD-10-CM | POA: Diagnosis not present

## 2019-01-20 DIAGNOSIS — I2699 Other pulmonary embolism without acute cor pulmonale: Secondary | ICD-10-CM | POA: Diagnosis not present

## 2019-01-20 DIAGNOSIS — I82412 Acute embolism and thrombosis of left femoral vein: Secondary | ICD-10-CM | POA: Diagnosis not present

## 2019-01-23 DIAGNOSIS — N400 Enlarged prostate without lower urinary tract symptoms: Secondary | ICD-10-CM | POA: Diagnosis not present

## 2019-01-23 DIAGNOSIS — N318 Other neuromuscular dysfunction of bladder: Secondary | ICD-10-CM | POA: Diagnosis not present

## 2019-01-23 DIAGNOSIS — N538 Other male sexual dysfunction: Secondary | ICD-10-CM | POA: Diagnosis not present

## 2019-01-23 DIAGNOSIS — R972 Elevated prostate specific antigen [PSA]: Secondary | ICD-10-CM | POA: Diagnosis not present

## 2019-01-23 DIAGNOSIS — N3020 Other chronic cystitis without hematuria: Secondary | ICD-10-CM | POA: Diagnosis not present

## 2019-01-27 DIAGNOSIS — I5041 Acute combined systolic (congestive) and diastolic (congestive) heart failure: Secondary | ICD-10-CM | POA: Diagnosis not present

## 2019-01-27 DIAGNOSIS — I82412 Acute embolism and thrombosis of left femoral vein: Secondary | ICD-10-CM | POA: Diagnosis not present

## 2019-01-27 DIAGNOSIS — E440 Moderate protein-calorie malnutrition: Secondary | ICD-10-CM | POA: Diagnosis not present

## 2019-01-27 DIAGNOSIS — N182 Chronic kidney disease, stage 2 (mild): Secondary | ICD-10-CM | POA: Diagnosis not present

## 2019-01-27 DIAGNOSIS — I2699 Other pulmonary embolism without acute cor pulmonale: Secondary | ICD-10-CM | POA: Diagnosis not present

## 2019-01-27 DIAGNOSIS — J189 Pneumonia, unspecified organism: Secondary | ICD-10-CM | POA: Diagnosis not present

## 2019-01-27 DIAGNOSIS — L89322 Pressure ulcer of left buttock, stage 2: Secondary | ICD-10-CM | POA: Diagnosis not present

## 2019-01-27 DIAGNOSIS — I110 Hypertensive heart disease with heart failure: Secondary | ICD-10-CM | POA: Diagnosis not present

## 2019-01-27 DIAGNOSIS — I50810 Right heart failure, unspecified: Secondary | ICD-10-CM | POA: Diagnosis not present

## 2019-01-28 DIAGNOSIS — J189 Pneumonia, unspecified organism: Secondary | ICD-10-CM | POA: Diagnosis not present

## 2019-01-28 DIAGNOSIS — E440 Moderate protein-calorie malnutrition: Secondary | ICD-10-CM | POA: Diagnosis not present

## 2019-01-28 DIAGNOSIS — I50810 Right heart failure, unspecified: Secondary | ICD-10-CM | POA: Diagnosis not present

## 2019-01-28 DIAGNOSIS — I2699 Other pulmonary embolism without acute cor pulmonale: Secondary | ICD-10-CM | POA: Diagnosis not present

## 2019-01-28 DIAGNOSIS — I5041 Acute combined systolic (congestive) and diastolic (congestive) heart failure: Secondary | ICD-10-CM | POA: Diagnosis not present

## 2019-01-28 DIAGNOSIS — N182 Chronic kidney disease, stage 2 (mild): Secondary | ICD-10-CM | POA: Diagnosis not present

## 2019-01-28 DIAGNOSIS — I82412 Acute embolism and thrombosis of left femoral vein: Secondary | ICD-10-CM | POA: Diagnosis not present

## 2019-01-28 DIAGNOSIS — L89322 Pressure ulcer of left buttock, stage 2: Secondary | ICD-10-CM | POA: Diagnosis not present

## 2019-01-28 DIAGNOSIS — I110 Hypertensive heart disease with heart failure: Secondary | ICD-10-CM | POA: Diagnosis not present

## 2019-01-30 ENCOUNTER — Ambulatory Visit (INDEPENDENT_AMBULATORY_CARE_PROVIDER_SITE_OTHER): Payer: Medicare HMO | Admitting: Cardiology

## 2019-01-30 ENCOUNTER — Telehealth: Payer: Self-pay | Admitting: *Deleted

## 2019-01-30 ENCOUNTER — Encounter: Payer: Self-pay | Admitting: Cardiology

## 2019-01-30 ENCOUNTER — Other Ambulatory Visit: Payer: Self-pay

## 2019-01-30 VITALS — BP 124/78 | HR 70 | Ht 72.0 in | Wt 185.8 lb

## 2019-01-30 DIAGNOSIS — I429 Cardiomyopathy, unspecified: Secondary | ICD-10-CM | POA: Insufficient documentation

## 2019-01-30 DIAGNOSIS — I5022 Chronic systolic (congestive) heart failure: Secondary | ICD-10-CM

## 2019-01-30 DIAGNOSIS — I5020 Unspecified systolic (congestive) heart failure: Secondary | ICD-10-CM

## 2019-01-30 DIAGNOSIS — I420 Dilated cardiomyopathy: Secondary | ICD-10-CM | POA: Diagnosis not present

## 2019-01-30 DIAGNOSIS — I2782 Chronic pulmonary embolism: Secondary | ICD-10-CM | POA: Diagnosis not present

## 2019-01-30 HISTORY — DX: Unspecified systolic (congestive) heart failure: I50.20

## 2019-01-30 NOTE — Telephone Encounter (Signed)
Called patient's home phone with no answer, unable to leave a message. Called and left a message on patient's daughter, Terri's, number to return call to discuss lab results received from Dr. Hale Bogus office per Dr. Wendy Poet request.

## 2019-01-30 NOTE — Patient Instructions (Signed)
Medication Instructions:  Your physician recommends that you continue on your current medications as directed. Please refer to the Current Medication list given to you today.  If you need a refill on your cardiac medications before your next appointment, please call your pharmacy.   Lab work: None  If you have labs (blood work) drawn today and your tests are completely normal, you will receive your results only by: Marland Kitchen MyChart Message (if you have MyChart) OR . A paper copy in the mail If you have any lab test that is abnormal or we need to change your treatment, we will call you to review the results.  Testing/Procedures: Your physician has requested that you have an echocardiogram. Echocardiography is a painless test that uses sound waves to create images of your heart. It provides your doctor with information about the size and shape of your heart and how well your heart's chambers and valves are working. This procedure takes approximately one hour. There are no restrictions for this procedure.   Follow-Up: At [facility], you and your health needs are our priority.  As part of our continuing mission to provide you with exceptional heart care, we have created designated Provider Care Teams.  These Care Teams include your primary Cardiologist (physician) and Advanced Practice Providers (APPs -  Physician Assistants and Nurse Practitioners) who all work together to provide you with the care you need, when you need it. You will need a follow up appointment in 2 months.

## 2019-01-30 NOTE — Progress Notes (Signed)
Cardiology Office Note:    Date:  01/30/2019   ID:  Benjamin Woods, DOB 12-Jul-1940, MRN [id number]  PCP:  Helen Hashimoto., MD  Cardiologist:  Jenne Campus, MD    Referring MD: Helen Hashimoto., MD   Chief Complaint  Patient presents with  . Hospitalization Follow-up    History of Present Illness:    Benjamin Woods is a 78 y.o. male with history of essential hypertension, gout recently he presented to hospital with episode of syncope.  He was find to have massive pulmonary emboli, also echocardiogram revealed diminished ejection fraction 25 to 30%.  He eventually ended being transferred to [facility].  He received excellent care there was put on anticoagulation Entresto beta-blocker was discharged home.  Today he comes to my office he is doing very well he comes with his granddaughter she tells me that he walks he drives he does whatever he wants to get no difficulty.  Overall it looks like he recovered completely from his pulmonary emboli.  Denies have any chest pain no swelling of lower extremities no palpitations  Past Medical History:  Diagnosis Date  . Acute combined systolic and diastolic heart failure ([place])   . BPH (benign prostatic hyperplasia)   . Cardiomyopathy ([place])   . GERD (gastroesophageal reflux disease)   . Gout   . HTN (hypertension)   . Hypocalcemia   . Hypokalemia   . Pulmonary embolism ([place])   . Stroke (cerebrum) ([place])   . Syncope     Past Surgical History:  Procedure Laterality Date  . HAND SURGERY      Current Medications: Current Meds  Medication Sig  . apixaban (ELIQUIS) 5 MG TABS tablet Take 2 tablets twice daily until 12/15/18, then continue with 1 tablet twice daily starting on 12/16/18.  Marland Kitchen carvedilol (COREG) 6.25 MG tablet Take 1 tablet (6.25 mg total) by mouth 2 (two) times daily with a meal.  . COLCRYS 0.6 MG tablet Take 0.6 mg by mouth 2 (two) times daily as needed (Gout).   Marland Kitchen ENTRESTO 24-26 MG Take 1 tablet by mouth 2  (two) times daily.  Marland Kitchen esomeprazole (NEXIUM) 40 MG capsule Take 40 mg by mouth daily.  . furosemide (LASIX) 20 MG tablet Take 1 tablet (20 mg total) by mouth daily.  . sildenafil (VIAGRA) 100 MG tablet Take 100 mg by mouth daily as needed for erectile dysfunction. One hour prior to sex  . timolol (TIMOPTIC) 0.5 % ophthalmic solution Place 1 drop into both eyes daily.      Allergies:   Patient has no known allergies.   Social History   Socioeconomic History  . Marital status: Married    Spouse name: Not on file  . Number of children: Not on file  . Years of education: Not on file  . Highest education level: Not on file  Occupational History  . Not on file  Social Needs  . Financial resource strain: Not on file  . Food insecurity    Worry: Not on file    Inability: Not on file  . Transportation needs    Medical: Not on file    Non-medical: Not on file  Tobacco Use  . Smoking status: Former Research scientist (life sciences)  . Smokeless tobacco: Never Used  Substance and Sexual Activity  . Alcohol use: Not Currently    Frequency: Never  . Drug use: Never  . Sexual activity: Not Currently  Lifestyle  . Physical activity    Days per week: Not  on file    Minutes per session: Not on file  . Stress: Not on file  Relationships  . Social Herbalist on phone: Not on file    Gets together: Not on file    Attends religious service: Not on file    Active member of club or organization: Not on file    Attends meetings of clubs or organizations: Not on file    Relationship status: Not on file  Other Topics Concern  . Not on file  Social History Narrative  . Not on file     Family History: The patient's family history is not on file. ROS:   Please see the history of present illness.    All 14 point review of systems negative except as described per history of present illness  EKGs/Labs/Other Studies Reviewed:      Recent Labs: 12/10/2018: B Natriuretic Peptide 1,997.9 12/13/2018:  Hemoglobin 11.8; Platelets 197 12/14/2018: BUN 28; Creatinine, Ser 1.29; Potassium 4.4; Sodium 134  Recent Lipid Panel No results found for: CHOL, TRIG, HDL, CHOLHDL, VLDL, LDLCALC, LDLDIRECT  Physical Exam:    VS:  BP 124/78   Pulse 70   Ht 6' (1.829 m)   Wt 185 lb 12.8 oz (84.3 kg)   SpO2 93%   BMI 25.20 kg/m     Wt Readings from Last 3 Encounters:  01/30/19 185 lb 12.8 oz (84.3 kg)  12/14/18 184 lb 4.8 oz (83.6 kg)     GEN:  Well nourished, well developed in no acute distress HEENT: Normal NECK: No JVD; No carotid bruits LYMPHATICS: No lymphadenopathy CARDIAC: RRR, no murmurs, no rubs, no gallops RESPIRATORY:  Clear to auscultation without rales, wheezing or rhonchi  ABDOMEN: Soft, non-tender, non-distended MUSCULOSKELETAL:  No edema; No deformity  SKIN: Warm and dry LOWER EXTREMITIES: no swelling NEUROLOGIC:  Alert and oriented x 3 PSYCHIATRIC:  Normal affect   ASSESSMENT:    1. Other chronic pulmonary embolism without acute cor pulmonale (HCC)   2. Dilated cardiomyopathy ([place])   3. Chronic systolic congestive heart failure (HCC)    PLAN:    In order of problems listed above:  1. Status post pulmonary emboli.  It was quite massive.  Recovering quite nicely anticoagulated which I will continue.  Will repeat echocardiogram in 2 months to check pulmonary pressure 2. Dilated cardiomyopathy ejection fraction 25-30 in August.  Will check Chem-7 if Chem-7 is fine will increase his Entresto to 4951 twice a day.  He is already on beta-blocker which I will continue.  We will repeat his echocardiogram in 2 months and at that time we will decide if we need to do some ischemia work-up. 3. Chronic systolic congestive heart therapies to be compensated now.  We will continue present management.  I reviewed records from [facility] as well as from [facility] for this visit   Medication Adjustments/Labs and Tests Ordered: Current medicines are reviewed at length with the  patient today.  Concerns regarding medicines are outlined above.  No orders of the defined types were placed in this encounter.  Medication changes: No orders of the defined types were placed in this encounter.   Signed, Park Liter, MD, [facility] 01/30/2019 4:09 PM    [facility]

## 2019-02-02 NOTE — Telephone Encounter (Signed)
Patient's daughter Benjamin Woods called back today. I advised that we did receive the lab results and that due to his kidney function that Dr. Agustin Cree did not want to increase his Entresto. Terri expressed understanding and advised she would let her father know.

## 2019-02-03 DIAGNOSIS — I50810 Right heart failure, unspecified: Secondary | ICD-10-CM | POA: Diagnosis not present

## 2019-02-03 DIAGNOSIS — I2699 Other pulmonary embolism without acute cor pulmonale: Secondary | ICD-10-CM | POA: Diagnosis not present

## 2019-02-03 DIAGNOSIS — L89322 Pressure ulcer of left buttock, stage 2: Secondary | ICD-10-CM | POA: Diagnosis not present

## 2019-02-03 DIAGNOSIS — I82412 Acute embolism and thrombosis of left femoral vein: Secondary | ICD-10-CM | POA: Diagnosis not present

## 2019-02-03 DIAGNOSIS — E440 Moderate protein-calorie malnutrition: Secondary | ICD-10-CM | POA: Diagnosis not present

## 2019-02-03 DIAGNOSIS — I5041 Acute combined systolic (congestive) and diastolic (congestive) heart failure: Secondary | ICD-10-CM | POA: Diagnosis not present

## 2019-02-03 DIAGNOSIS — I110 Hypertensive heart disease with heart failure: Secondary | ICD-10-CM | POA: Diagnosis not present

## 2019-02-03 DIAGNOSIS — N182 Chronic kidney disease, stage 2 (mild): Secondary | ICD-10-CM | POA: Diagnosis not present

## 2019-02-03 DIAGNOSIS — J189 Pneumonia, unspecified organism: Secondary | ICD-10-CM | POA: Diagnosis not present

## 2019-02-06 ENCOUNTER — Other Ambulatory Visit: Payer: Self-pay | Admitting: Cardiology

## 2019-02-06 DIAGNOSIS — H2511 Age-related nuclear cataract, right eye: Secondary | ICD-10-CM | POA: Diagnosis not present

## 2019-02-06 MED ORDER — CARVEDILOL 6.25 MG PO TABS: 6.2500 mg | ORAL_TABLET | Freq: Two times a day (BID) | ORAL | 0 refills | Status: DC

## 2019-02-06 NOTE — Telephone Encounter (Signed)
° °  1. Which medications need to be refilled? (please list name of each medication and dose if known) Carvedilol  Takes 1 twie daily   2. Which pharmacy/location (including street and city if local pharmacy) is medication to be sent to? [facility]   3. Do they need a 30 day or 90 day supply? 180   Patient is out.Marland Kitchen

## 2019-02-06 NOTE — Telephone Encounter (Signed)
Rx for carvedilol sent to [facility] as requested.  

## 2019-02-06 NOTE — Addendum Note (Signed)
Addended by: Stevan Born on: 02/06/2019 05:04 PM   Modules accepted: Orders

## 2019-02-17 ENCOUNTER — Other Ambulatory Visit: Payer: Self-pay

## 2019-02-17 MED ORDER — FUROSEMIDE 20 MG PO TABS: 20.0000 mg | ORAL_TABLET | Freq: Every day | ORAL | 3 refills | Status: DC

## 2019-02-18 DIAGNOSIS — I10 Essential (primary) hypertension: Secondary | ICD-10-CM | POA: Diagnosis not present

## 2019-02-18 DIAGNOSIS — Z9181 History of falling: Secondary | ICD-10-CM | POA: Diagnosis not present

## 2019-02-18 DIAGNOSIS — E785 Hyperlipidemia, unspecified: Secondary | ICD-10-CM | POA: Diagnosis not present

## 2019-02-18 DIAGNOSIS — Z0000 Encounter for general adult medical examination without abnormal findings: Secondary | ICD-10-CM | POA: Diagnosis not present

## 2019-02-18 DIAGNOSIS — Z1331 Encounter for screening for depression: Secondary | ICD-10-CM | POA: Diagnosis not present

## 2019-02-18 DIAGNOSIS — Z125 Encounter for screening for malignant neoplasm of prostate: Secondary | ICD-10-CM | POA: Diagnosis not present

## 2019-02-26 DIAGNOSIS — I10 Essential (primary) hypertension: Secondary | ICD-10-CM | POA: Diagnosis not present

## 2019-02-26 DIAGNOSIS — Z79899 Other long term (current) drug therapy: Secondary | ICD-10-CM | POA: Diagnosis not present

## 2019-02-26 DIAGNOSIS — E785 Hyperlipidemia, unspecified: Secondary | ICD-10-CM | POA: Diagnosis not present

## 2019-02-26 DIAGNOSIS — N184 Chronic kidney disease, stage 4 (severe): Secondary | ICD-10-CM | POA: Diagnosis not present

## 2019-02-26 DIAGNOSIS — Z6825 Body mass index (BMI) 25.0-25.9, adult: Secondary | ICD-10-CM | POA: Diagnosis not present

## 2019-02-26 DIAGNOSIS — N529 Male erectile dysfunction, unspecified: Secondary | ICD-10-CM | POA: Diagnosis not present

## 2019-02-26 DIAGNOSIS — M109 Gout, unspecified: Secondary | ICD-10-CM | POA: Diagnosis not present

## 2019-02-26 DIAGNOSIS — E663 Overweight: Secondary | ICD-10-CM | POA: Diagnosis not present

## 2019-02-26 DIAGNOSIS — K219 Gastro-esophageal reflux disease without esophagitis: Secondary | ICD-10-CM | POA: Diagnosis not present

## 2019-03-05 ENCOUNTER — Other Ambulatory Visit: Payer: Self-pay

## 2019-03-05 ENCOUNTER — Ambulatory Visit (INDEPENDENT_AMBULATORY_CARE_PROVIDER_SITE_OTHER): Payer: Medicare HMO

## 2019-03-05 DIAGNOSIS — I2782 Chronic pulmonary embolism: Secondary | ICD-10-CM

## 2019-03-05 DIAGNOSIS — I420 Dilated cardiomyopathy: Secondary | ICD-10-CM

## 2019-03-05 DIAGNOSIS — I5022 Chronic systolic (congestive) heart failure: Secondary | ICD-10-CM

## 2019-03-05 NOTE — Progress Notes (Signed)
Complete echocardiogram has been performed.  Jimmy Jacki Couse RDCS, RVT 

## 2019-03-06 ENCOUNTER — Telehealth: Payer: Self-pay | Admitting: Emergency Medicine

## 2019-03-06 NOTE — Telephone Encounter (Signed)
Called patient and informed him of echocardiogram results. During the call he asked me to call his daughter at [phone] and inform her. I called that number and left a message for her to return call.

## 2019-03-06 NOTE — Telephone Encounter (Signed)
Daughter called back informed her of results.

## 2019-03-21 DIAGNOSIS — I10 Essential (primary) hypertension: Secondary | ICD-10-CM | POA: Diagnosis not present

## 2019-03-21 DIAGNOSIS — Z9181 History of falling: Secondary | ICD-10-CM | POA: Diagnosis not present

## 2019-03-27 ENCOUNTER — Ambulatory Visit (INDEPENDENT_AMBULATORY_CARE_PROVIDER_SITE_OTHER): Payer: Medicare HMO | Admitting: Cardiology

## 2019-03-27 ENCOUNTER — Encounter: Payer: Self-pay | Admitting: Cardiology

## 2019-03-27 ENCOUNTER — Other Ambulatory Visit: Payer: Self-pay

## 2019-03-27 VITALS — BP 118/64 | HR 64 | Ht 72.0 in | Wt 195.0 lb

## 2019-03-27 DIAGNOSIS — I420 Dilated cardiomyopathy: Secondary | ICD-10-CM | POA: Diagnosis not present

## 2019-03-27 DIAGNOSIS — I5022 Chronic systolic (congestive) heart failure: Secondary | ICD-10-CM | POA: Diagnosis not present

## 2019-03-27 DIAGNOSIS — I10 Essential (primary) hypertension: Secondary | ICD-10-CM | POA: Diagnosis not present

## 2019-03-27 NOTE — Addendum Note (Signed)
Addended by: Ashok Norris on: 03/27/2019 04:33 PM   Modules accepted: Orders

## 2019-03-27 NOTE — Patient Instructions (Addendum)
Medication Instructions:  Your physician recommends that you continue on your current medications as directed. Please refer to the Current Medication list given to you today.  *If you need a refill on your cardiac medications before your next appointment, please call your pharmacy*  Lab Work: Your physician recommends that you return for lab work in: bmp   If you have labs (blood work) drawn today and your tests are completely normal, you will receive your results only by: Marland Kitchen MyChart Message (if you have MyChart) OR . A paper copy in the mail If you have any lab test that is abnormal or we need to change your treatment, we will call you to review the results.  Testing/Procedures: None.   Follow-Up: At [facility], you and your health needs are our priority.  As part of our continuing mission to provide you with exceptional heart care, we have created designated Provider Care Teams.  These Care Teams include your primary Cardiologist (physician) and Advanced Practice Providers (APPs -  Physician Assistants and Nurse Practitioners) who all work together to provide you with the care you need, when you need it.  Your next appointment:   5 month(s)  The format for your next appointment:   In Person  Provider:   Jenne Campus, MD  Other Instructions    .ctchmg

## 2019-03-27 NOTE — Progress Notes (Signed)
Cardiology Office Note:    Date:  03/27/2019   ID:  Benjamin Woods, DOB 26-Jul-1940, MRN [id number]  PCP:  Helen Hashimoto., MD  Cardiologist:  Jenne Campus, MD    Referring MD: Helen Hashimoto., MD   Chief Complaint  Patient presents with  . Follow-up  Doing well  History of Present Illness:    Benjamin Woods is a 78 y.o. male with past medical history significant for cardiomyopathy ejection fraction 25 to 30% in August 2020 however repeated echocardiogram in October showed ejection fraction 40 to 45%.  Also history of pulmonary emboli and DVT.  Comes today to my office for follow-up.  Overall doing well.  Denies having a chest pain, tightness, squeezing, pressure, burning in the chest.  He move around using a cane and doing a lot of walking.  His daughter is with him in the room while talking.  Past Medical History:  Diagnosis Date  . Acute combined systolic and diastolic heart failure ([place])   . BPH (benign prostatic hyperplasia)   . Cardiomyopathy ([place])   . GERD (gastroesophageal reflux disease)   . Gout   . HTN (hypertension)   . Hypocalcemia   . Hypokalemia   . Pulmonary embolism ([place])   . Stroke (cerebrum) ([place])   . Syncope     Past Surgical History:  Procedure Laterality Date  . HAND SURGERY      Current Medications: Current Meds  Medication Sig  . apixaban (ELIQUIS) 5 MG TABS tablet Take 2 tablets twice daily until 12/15/18, then continue with 1 tablet twice daily starting on 12/16/18.  Marland Kitchen carvedilol (COREG) 6.25 MG tablet Take 1 tablet (6.25 mg total) by mouth 2 (two) times daily with a meal.  . COLCRYS 0.6 MG tablet Take 0.6 mg by mouth 2 (two) times daily as needed (Gout).   . DEXILANT 60 MG capsule Take 1 capsule by mouth daily as needed.  Marland Kitchen ENTRESTO 24-26 MG Take 1 tablet by mouth 2 (two) times daily.  . furosemide (LASIX) 20 MG tablet Take 1 tablet (20 mg total) by mouth daily.  . sildenafil (VIAGRA) 100 MG tablet Take 100 mg by mouth  daily as needed for erectile dysfunction. One hour prior to sex  . timolol (TIMOPTIC) 0.5 % ophthalmic solution Place 1 drop into both eyes daily.      Allergies:   Patient has no known allergies.   Social History   Socioeconomic History  . Marital status: Married    Spouse name: Not on file  . Number of children: Not on file  . Years of education: Not on file  . Highest education level: Not on file  Occupational History  . Not on file  Social Needs  . Financial resource strain: Not on file  . Food insecurity    Worry: Not on file    Inability: Not on file  . Transportation needs    Medical: Not on file    Non-medical: Not on file  Tobacco Use  . Smoking status: Former Research scientist (life sciences)  . Smokeless tobacco: Never Used  Substance and Sexual Activity  . Alcohol use: Not Currently    Frequency: Never  . Drug use: Never  . Sexual activity: Not Currently  Lifestyle  . Physical activity    Days per week: Not on file    Minutes per session: Not on file  . Stress: Not on file  Relationships  . Social connections    Talks on phone: Not on file  Gets together: Not on file    Attends religious service: Not on file    Active member of club or organization: Not on file    Attends meetings of clubs or organizations: Not on file    Relationship status: Not on file  Other Topics Concern  . Not on file  Social History Narrative  . Not on file     Family History: The patient's family history is not on file. ROS:   Please see the history of present illness.    All 14 point review of systems negative except as described per history of present illness  EKGs/Labs/Other Studies Reviewed:      Recent Labs: 12/10/2018: B Natriuretic Peptide 1,997.9 12/13/2018: Hemoglobin 11.8; Platelets 197 12/14/2018: BUN 28; Creatinine, Ser 1.29; Potassium 4.4; Sodium 134  Recent Lipid Panel No results found for: CHOL, TRIG, HDL, CHOLHDL, VLDL, LDLCALC, LDLDIRECT  Physical Exam:    VS:  BP 118/64    Pulse 64   Ht 6' (1.829 m)   Wt 195 lb (88.5 kg)   SpO2 92%   BMI 26.45 kg/m     Wt Readings from Last 3 Encounters:  03/27/19 195 lb (88.5 kg)  01/30/19 185 lb 12.8 oz (84.3 kg)  12/14/18 184 lb 4.8 oz (83.6 kg)     GEN:  Well nourished, well developed in no acute distress HEENT: Normal NECK: No JVD; No carotid bruits LYMPHATICS: No lymphadenopathy CARDIAC: RRR, no murmurs, no rubs, no gallops RESPIRATORY:  Clear to auscultation without rales, wheezing or rhonchi  ABDOMEN: Soft, non-tender, non-distended MUSCULOSKELETAL:  No edema; No deformity  SKIN: Warm and dry LOWER EXTREMITIES: no swelling NEUROLOGIC:  Alert and oriented x 3 PSYCHIATRIC:  Normal affect   ASSESSMENT:    1. Chronic systolic congestive heart failure ([place])   2. Hypertension, unspecified type   3. Dilated cardiomyopathy ([place])    PLAN:    In order of problems listed above:  1. Chronic systolic congestive heart failure on appropriate medications which I will continue.  I will ask you to have Chem-7 done today. 2. Essential hypertension blood pressure well controlled continue present management. 3. Dilated cardiomyopathy: Improvement in ejection fraction to 4045% which is a great success.  Continue present management. 4. History of DVT/PE.  On anticoagulation which I will continue.   Medication Adjustments/Labs and Tests Ordered: Current medicines are reviewed at length with the patient today.  Concerns regarding medicines are outlined above.  No orders of the defined types were placed in this encounter.  Medication changes: No orders of the defined types were placed in this encounter.   Signed, Park Liter, MD, [facility] 03/27/2019 [place]

## 2019-03-28 LAB — BASIC METABOLIC PANEL
BUN/Creatinine Ratio: 28 — ABNORMAL HIGH (ref 10–24)
BUN: 39 mg/dL — ABNORMAL HIGH (ref 8–27)
CO2: 22 mmol/L (ref 20–29)
Calcium: 9.6 mg/dL (ref 8.6–10.2)
Chloride: 99 mmol/L (ref 96–106)
Creatinine, Ser: 1.4 mg/dL — ABNORMAL HIGH (ref 0.76–1.27)
GFR calc Af Amer: 55 mL/min/{1.73_m2} — ABNORMAL LOW (ref >59)
GFR calc non Af Amer: 48 mL/min/{1.73_m2} — ABNORMAL LOW (ref >59)
Glucose: 69 mg/dL (ref 65–99)
Potassium: 4.7 mmol/L (ref 3.5–5.2)
Sodium: 139 mmol/L (ref 134–144)

## 2019-04-20 DIAGNOSIS — Z9181 History of falling: Secondary | ICD-10-CM | POA: Diagnosis not present

## 2019-04-20 DIAGNOSIS — I10 Essential (primary) hypertension: Secondary | ICD-10-CM | POA: Diagnosis not present

## 2019-05-14 DIAGNOSIS — Z20828 Contact with and (suspected) exposure to other viral communicable diseases: Secondary | ICD-10-CM | POA: Diagnosis not present

## 2019-05-21 DIAGNOSIS — Z9181 History of falling: Secondary | ICD-10-CM | POA: Diagnosis not present

## 2019-05-21 DIAGNOSIS — I10 Essential (primary) hypertension: Secondary | ICD-10-CM | POA: Diagnosis not present

## 2019-05-29 DIAGNOSIS — N538 Other male sexual dysfunction: Secondary | ICD-10-CM | POA: Diagnosis not present

## 2019-05-29 DIAGNOSIS — N318 Other neuromuscular dysfunction of bladder: Secondary | ICD-10-CM | POA: Diagnosis not present

## 2019-05-29 DIAGNOSIS — R972 Elevated prostate specific antigen [PSA]: Secondary | ICD-10-CM | POA: Diagnosis not present

## 2019-05-29 DIAGNOSIS — N400 Enlarged prostate without lower urinary tract symptoms: Secondary | ICD-10-CM | POA: Diagnosis not present

## 2019-06-08 DIAGNOSIS — N390 Urinary tract infection, site not specified: Secondary | ICD-10-CM | POA: Diagnosis not present

## 2019-06-08 DIAGNOSIS — N3020 Other chronic cystitis without hematuria: Secondary | ICD-10-CM | POA: Diagnosis not present

## 2019-06-21 DIAGNOSIS — Z9181 History of falling: Secondary | ICD-10-CM | POA: Diagnosis not present

## 2019-06-21 DIAGNOSIS — I10 Essential (primary) hypertension: Secondary | ICD-10-CM | POA: Diagnosis not present

## 2019-06-29 DIAGNOSIS — Z79899 Other long term (current) drug therapy: Secondary | ICD-10-CM | POA: Diagnosis not present

## 2019-06-29 DIAGNOSIS — E785 Hyperlipidemia, unspecified: Secondary | ICD-10-CM | POA: Diagnosis not present

## 2019-06-29 DIAGNOSIS — K219 Gastro-esophageal reflux disease without esophagitis: Secondary | ICD-10-CM | POA: Diagnosis not present

## 2019-06-29 DIAGNOSIS — I10 Essential (primary) hypertension: Secondary | ICD-10-CM | POA: Diagnosis not present

## 2019-06-29 DIAGNOSIS — M109 Gout, unspecified: Secondary | ICD-10-CM | POA: Diagnosis not present

## 2019-06-29 DIAGNOSIS — N529 Male erectile dysfunction, unspecified: Secondary | ICD-10-CM | POA: Diagnosis not present

## 2019-06-29 DIAGNOSIS — Z6826 Body mass index (BMI) 26.0-26.9, adult: Secondary | ICD-10-CM | POA: Diagnosis not present

## 2019-06-29 DIAGNOSIS — E663 Overweight: Secondary | ICD-10-CM | POA: Diagnosis not present

## 2019-07-19 DIAGNOSIS — Z9181 History of falling: Secondary | ICD-10-CM | POA: Diagnosis not present

## 2019-07-19 DIAGNOSIS — I10 Essential (primary) hypertension: Secondary | ICD-10-CM | POA: Diagnosis not present

## 2019-08-13 ENCOUNTER — Other Ambulatory Visit: Payer: Self-pay

## 2019-08-13 ENCOUNTER — Ambulatory Visit: Payer: Medicare HMO | Admitting: Cardiology

## 2019-08-13 VITALS — BP 116/76 | HR 56 | Temp 97.0°F | Ht 72.0 in | Wt 199.2 lb

## 2019-08-13 DIAGNOSIS — R0609 Other forms of dyspnea: Secondary | ICD-10-CM

## 2019-08-13 DIAGNOSIS — R0600 Dyspnea, unspecified: Secondary | ICD-10-CM | POA: Diagnosis not present

## 2019-08-13 DIAGNOSIS — I5022 Chronic systolic (congestive) heart failure: Secondary | ICD-10-CM

## 2019-08-13 DIAGNOSIS — I10 Essential (primary) hypertension: Secondary | ICD-10-CM | POA: Diagnosis not present

## 2019-08-13 DIAGNOSIS — I420 Dilated cardiomyopathy: Secondary | ICD-10-CM | POA: Diagnosis not present

## 2019-08-13 NOTE — Progress Notes (Signed)
Cardiology Office Note:    Date:  08/13/2019   ID:  Benjamin Woods, DOB 1940/09/25, MRN [id number]  PCP:  Helen Hashimoto., MD  Cardiologist:  Jenne Campus, MD    Referring MD: Helen Hashimoto., MD   No chief complaint on file. Been doing fine  History of Present Illness:    Benjamin Woods is a 79 y.o. male with past medical history significant for DVT and PE as well as CVA.  At the same time which was in December of last year he was discovered to have significant cardiomyopathy with ejection fraction 25 to 30%.  He was put on appropriate medication likely his left ventricle ejection fraction improved to 40 to 45%.  Today he comes to my office for follow-up.  Overall he is doing well.  He denies have any chest pain tightness squeezing pressure burning chest.  He denies having any shortness of breath.  No swelling of lower extremities  Past Medical History:  Diagnosis Date  . Acute combined systolic and diastolic congestive heart failure ([place]) 12/10/2018  . Acute combined systolic and diastolic heart failure ([place])   . BPH (benign prostatic hyperplasia)   . Cardiomyopathy ([place])   . DVT (deep venous thrombosis) ([place]) 12/10/2018  . Elevated troponin 12/10/2018  . GERD (gastroesophageal reflux disease)   . Gout   . HTN (hypertension)   . Hypocalcemia   . Hypokalemia   . Pressure injury of skin 12/11/2018  . Protein-calorie malnutrition, moderate ([place]) 12/10/2018  . Pulmonary embolism ([place])   . Stroke (cerebrum) ([place])   . Syncope   . Systolic congestive heart failure ([place]) 01/30/2019    Past Surgical History:  Procedure Laterality Date  . HAND SURGERY      Current Medications: Current Meds  Medication Sig  . apixaban (ELIQUIS) 5 MG TABS tablet Take 2 tablets twice daily until 12/15/18, then continue with 1 tablet twice daily starting on 12/16/18.  Marland Kitchen carvedilol (COREG) 25 MG tablet   . DEXILANT 60 MG capsule Take 1 capsule by mouth daily as needed.  Marland Kitchen ENTRESTO 24-26  MG Take 1 tablet by mouth 2 (two) times daily.  . furosemide (LASIX) 20 MG tablet Take 1 tablet (20 mg total) by mouth daily.  Marland Kitchen MITIGARE 0.6 MG CAPS   . sildenafil (VIAGRA) 100 MG tablet Take 100 mg by mouth daily as needed for erectile dysfunction. One hour prior to sex  . timolol (TIMOPTIC) 0.5 % ophthalmic solution Place 1 drop into both eyes daily.      Allergies:   Patient has no known allergies.   Social History   Socioeconomic History  . Marital status: Married    Spouse name: Not on file  . Number of children: Not on file  . Years of education: Not on file  . Highest education level: Not on file  Occupational History  . Not on file  Tobacco Use  . Smoking status: Former Research scientist (life sciences)  . Smokeless tobacco: Never Used  Substance and Sexual Activity  . Alcohol use: Not Currently  . Drug use: Never  . Sexual activity: Not Currently  Other Topics Concern  . Not on file  Social History Narrative  . Not on file   Social Determinants of Health   Financial Resource Strain:   . Difficulty of Paying Living Expenses:   Food Insecurity:   . Worried About Charity fundraiser in the Last Year:   . [place] in the Last Year:  Transportation Needs:   . Film/video editor (Medical):   Marland Kitchen Lack of Transportation (Non-Medical):   Physical Activity:   . Days of Exercise per Week:   . Minutes of Exercise per Session:   Stress:   . Feeling of Stress :   Social Connections:   . Frequency of Communication with Friends and Family:   . Frequency of Social Gatherings with Friends and Family:   . Attends Religious Services:   . Active Member of Clubs or Organizations:   . Attends Archivist Meetings:   Marland Kitchen Marital Status:      Family History: The patient's family history is not on file. ROS:   Please see the history of present illness.    All 14 point review of systems negative except as described per history of present illness  EKGs/Labs/Other Studies Reviewed:       Recent Labs: 12/10/2018: B Natriuretic Peptide 1,997.9 12/13/2018: Hemoglobin 11.8; Platelets 197 03/27/2019: BUN 39; Creatinine, Ser 1.40; Potassium 4.7; Sodium 139  Recent Lipid Panel No results found for: CHOL, TRIG, HDL, CHOLHDL, VLDL, LDLCALC, LDLDIRECT  Physical Exam:    VS:  BP 116/76   Pulse (!) 56   Temp (!) 97 F (36.1 C)   Ht 6' (1.829 m)   Wt 199 lb 3.2 oz (90.4 kg)   SpO2 94%   BMI 27.02 kg/m     Wt Readings from Last 3 Encounters:  08/13/19 199 lb 3.2 oz (90.4 kg)  03/27/19 195 lb (88.5 kg)  01/30/19 185 lb 12.8 oz (84.3 kg)     GEN:  Well nourished, well developed in no acute distress HEENT: Normal NECK: No JVD; No carotid bruits LYMPHATICS: No lymphadenopathy CARDIAC: RRR, no murmurs, no rubs, no gallops RESPIRATORY: Poor air entry in the right base, otherwise clear ABDOMEN: Soft, non-tender, non-distended MUSCULOSKELETAL:  No edema; No deformity  SKIN: Warm and dry LOWER EXTREMITIES: no swelling NEUROLOGIC:  Alert and oriented x 3 PSYCHIATRIC:  Normal affect   ASSESSMENT:    1. Dyspnea on exertion   2. Dilated cardiomyopathy ([place])   3. Chronic systolic congestive heart failure ([place])   4. Hypertension, unspecified type    PLAN:    In order of problems listed above:  1. Dyspnea on exertion.  Surprisingly on the physical exam he has very poor air entry on the right base of his lungs.  I will ask him to have a chest x-ray to clarify the diagnosis.  I am worried he may be having some pleural effusio  I did review his chest x-ray, from last admission, there was some atelectasis but no fluid.   2. Cardiomyopathy on appropriate medications which I will continue.  Improvement 3. Essential hypertension: Blood pressure well controlled   Medication Adjustments/Labs and Tests Ordered: Current medicines are reviewed at length with the patient today.  Concerns regarding medicines are outlined above.  Orders Placed This Encounter  Procedures  . DG Chest 2  View   Medication changes: No orders of the defined types were placed in this encounter.   Signed, Park Liter, MD, [facility] 08/13/2019 4:54 PM    [place]

## 2019-08-13 NOTE — Patient Instructions (Signed)
Medication Instructions:  Your physician recommends that you continue on your current medications as directed. Please refer to the Current Medication list given to you today.  *If you need a refill on your cardiac medications before your next appointment, please call your pharmacy*   Lab Work: None.  If you have labs (blood work) drawn today and your tests are completely normal, you will receive your results only by: Marland Kitchen MyChart Message (if you have MyChart) OR . A paper copy in the mail If you have any lab test that is abnormal or we need to change your treatment, we will call you to review the results.   Testing/Procedures: A chest x-ray takes a picture of the organs and structures inside the chest, including the heart, lungs, and blood vessels. This test can show several things, including, whether the heart is enlarges; whether fluid is building up in the lungs; and whether pacemaker / defibrillator leads are still in place.    Follow-Up: At [facility], you and your health needs are our priority.  As part of our continuing mission to provide you with exceptional heart care, we have created designated Provider Care Teams.  These Care Teams include your primary Cardiologist (physician) and Advanced Practice Providers (APPs -  Physician Assistants and Nurse Practitioners) who all work together to provide you with the care you need, when you need it.  We recommend signing up for the patient portal called "MyChart".  Sign up information is provided on this After Visit Summary.  MyChart is used to connect with patients for Virtual Visits (Telemedicine).  Patients are able to view lab/test results, encounter notes, upcoming appointments, etc.  Non-urgent messages can be sent to your provider as well.   To learn more about what you can do with MyChart, go to [email or web address].    Your next appointment:   4 month(s)  The format for your next appointment:   In Person  Provider:    Jenne Campus, MD   Other Instructions

## 2019-08-14 ENCOUNTER — Encounter: Payer: Self-pay | Admitting: Cardiology

## 2019-08-14 DIAGNOSIS — R05 Cough: Secondary | ICD-10-CM | POA: Diagnosis not present

## 2019-08-14 DIAGNOSIS — R0600 Dyspnea, unspecified: Secondary | ICD-10-CM | POA: Diagnosis not present

## 2019-08-19 DIAGNOSIS — Z9181 History of falling: Secondary | ICD-10-CM | POA: Diagnosis not present

## 2019-08-19 DIAGNOSIS — I10 Essential (primary) hypertension: Secondary | ICD-10-CM | POA: Diagnosis not present

## 2019-09-18 DIAGNOSIS — Z9181 History of falling: Secondary | ICD-10-CM | POA: Diagnosis not present

## 2019-09-18 DIAGNOSIS — I10 Essential (primary) hypertension: Secondary | ICD-10-CM | POA: Diagnosis not present

## 2019-10-07 DIAGNOSIS — N401 Enlarged prostate with lower urinary tract symptoms: Secondary | ICD-10-CM | POA: Diagnosis not present

## 2019-10-07 DIAGNOSIS — N411 Chronic prostatitis: Secondary | ICD-10-CM | POA: Diagnosis not present

## 2019-10-07 DIAGNOSIS — Z79899 Other long term (current) drug therapy: Secondary | ICD-10-CM | POA: Diagnosis not present

## 2019-10-19 DIAGNOSIS — Z9181 History of falling: Secondary | ICD-10-CM | POA: Diagnosis not present

## 2019-10-19 DIAGNOSIS — I10 Essential (primary) hypertension: Secondary | ICD-10-CM | POA: Diagnosis not present

## 2019-10-22 ENCOUNTER — Telehealth: Payer: Self-pay | Admitting: Cardiology

## 2019-10-22 NOTE — Telephone Encounter (Signed)
Patient returning Benjamin Woods call.  

## 2019-10-22 NOTE — Telephone Encounter (Signed)
Left message for patient to return call.

## 2019-10-22 NOTE — Telephone Encounter (Signed)
Called patient informed him that it is ok for him to take the medication he was asking about below per Dr. Agustin Cree. Patient verbally understood. No further questions.

## 2019-10-22 NOTE — Telephone Encounter (Signed)
New Message:    Pt wants to know if it is alright for him to take  Cet[place] along with the rest of his medicine? He said it is for his Allergy.

## 2019-10-22 NOTE — Telephone Encounter (Signed)
It should be fine to take it

## 2019-10-27 DIAGNOSIS — I10 Essential (primary) hypertension: Secondary | ICD-10-CM | POA: Diagnosis not present

## 2019-10-27 DIAGNOSIS — Z6827 Body mass index (BMI) 27.0-27.9, adult: Secondary | ICD-10-CM | POA: Diagnosis not present

## 2019-10-27 DIAGNOSIS — N529 Male erectile dysfunction, unspecified: Secondary | ICD-10-CM | POA: Diagnosis not present

## 2019-10-27 DIAGNOSIS — Z79899 Other long term (current) drug therapy: Secondary | ICD-10-CM | POA: Diagnosis not present

## 2019-10-27 DIAGNOSIS — E663 Overweight: Secondary | ICD-10-CM | POA: Diagnosis not present

## 2019-10-27 DIAGNOSIS — E785 Hyperlipidemia, unspecified: Secondary | ICD-10-CM | POA: Diagnosis not present

## 2019-10-27 DIAGNOSIS — K219 Gastro-esophageal reflux disease without esophagitis: Secondary | ICD-10-CM | POA: Diagnosis not present

## 2019-10-27 DIAGNOSIS — M109 Gout, unspecified: Secondary | ICD-10-CM | POA: Diagnosis not present

## 2019-11-11 ENCOUNTER — Telehealth: Payer: Self-pay | Admitting: Cardiology

## 2019-11-11 NOTE — Telephone Encounter (Signed)
Pt wanted to know if he could use some topical lidocaine on his back. He has a hx of gout and wanted to check with Dr. Raliegh Ip before he used anything. Please advise

## 2019-11-12 NOTE — Telephone Encounter (Signed)
Should be fine from my point of view

## 2019-11-12 NOTE — Telephone Encounter (Signed)
Left message fore patient to return call.

## 2019-11-13 NOTE — Telephone Encounter (Signed)
Left message for patient to return call.

## 2019-11-13 NOTE — Telephone Encounter (Signed)
Returned patients call no answer LMTCB 

## 2019-11-13 NOTE — Telephone Encounter (Signed)
Patient returning call.

## 2019-11-18 DIAGNOSIS — Z9181 History of falling: Secondary | ICD-10-CM | POA: Diagnosis not present

## 2019-11-18 DIAGNOSIS — I10 Essential (primary) hypertension: Secondary | ICD-10-CM | POA: Diagnosis not present

## 2019-12-19 DIAGNOSIS — I10 Essential (primary) hypertension: Secondary | ICD-10-CM | POA: Diagnosis not present

## 2019-12-19 DIAGNOSIS — Z9181 History of falling: Secondary | ICD-10-CM | POA: Diagnosis not present

## 2019-12-21 ENCOUNTER — Ambulatory Visit: Payer: Medicare HMO | Admitting: Cardiology

## 2019-12-22 ENCOUNTER — Ambulatory Visit: Payer: Medicare HMO | Admitting: Cardiology

## 2019-12-22 ENCOUNTER — Other Ambulatory Visit: Payer: Self-pay

## 2019-12-22 ENCOUNTER — Encounter: Payer: Self-pay | Admitting: Cardiology

## 2019-12-22 VITALS — BP 128/78 | HR 57 | Ht 72.0 in | Wt 200.2 lb

## 2019-12-22 DIAGNOSIS — I420 Dilated cardiomyopathy: Secondary | ICD-10-CM

## 2019-12-22 DIAGNOSIS — I5022 Chronic systolic (congestive) heart failure: Secondary | ICD-10-CM

## 2019-12-22 DIAGNOSIS — Z86711 Personal history of pulmonary embolism: Secondary | ICD-10-CM

## 2019-12-22 HISTORY — DX: Personal history of pulmonary embolism: Z86.711

## 2019-12-22 NOTE — Patient Instructions (Signed)
Medication Instructions:  °Your physician recommends that you continue on your current medications as directed. Please refer to the Current Medication list given to you today. ° °*If you need a refill on your cardiac medications before your next appointment, please call your pharmacy* ° ° °Lab Work: °None. ° °If you have labs (blood work) drawn today and your tests are completely normal, you will receive your results only by: °• MyChart Message (if you have MyChart) OR °• A paper copy in the mail °If you have any lab test that is abnormal or we need to change your treatment, we will call you to review the results. ° ° °Testing/Procedures: °Your physician has requested that you have an echocardiogram. Echocardiography is a painless test that uses sound waves to create images of your heart. It provides your doctor with information about the size and shape of your heart and how well your heart’s chambers and valves are working. This procedure takes approximately one hour. There are no restrictions for this procedure. ° ° ° ° °Follow-Up: °At [facility], you and your health needs are our priority.  As part of our continuing mission to provide you with exceptional heart care, we have created designated Provider Care Teams.  These Care Teams include your primary Cardiologist (physician) and Advanced Practice Providers (APPs -  Physician Assistants and Nurse Practitioners) who all work together to provide you with the care you need, when you need it. ° °We recommend signing up for the patient portal called "MyChart".  Sign up information is provided on this After Visit Summary.  MyChart is used to connect with patients for Virtual Visits (Telemedicine).  Patients are able to view lab/test results, encounter notes, upcoming appointments, etc.  Non-urgent messages can be sent to your provider as well.   °To learn more about what you can do with MyChart, go to [email or web address].[email or web address].   ° °Your next appointment:   °6  month(s) ° °The format for your next appointment:   °In Person ° °Provider:   °Robert Krasowski, MD ° ° °Other Instructions ° ° °Echocardiogram °An echocardiogram is a procedure that uses painless sound waves (ultrasound) to produce an image of the heart. Images from an echocardiogram can provide important information about: °· Signs of coronary artery disease (CAD). °· Aneurysm detection. An aneurysm is a weak or damaged part of an artery wall that bulges out from the normal force of blood pumping through the body. °· Heart size and shape. Changes in the size or shape of the heart can be associated with certain conditions, including heart failure, aneurysm, and CAD. °· Heart muscle function. °· Heart valve function. °· Signs of a past heart attack. °· Fluid buildup around the heart. °· Thickening of the heart muscle. °· A tumor or infectious growth around the heart valves. °Tell a health care provider about: °· Any allergies you have. °· All medicines you are taking, including vitamins, herbs, eye drops, creams, and over-the-counter medicines. °· Any blood disorders you have. °· Any surgeries you have had. °· Any medical conditions you have. °· Whether you are pregnant or may be pregnant. °What are the risks? °Generally, this is a safe procedure. However, problems may occur, including: °· Allergic reaction to dye (contrast) that may be used during the procedure. °What happens before the procedure? °No specific preparation is needed. You may eat and drink normally. °What happens during the procedure? ° °· An IV tube may be inserted into one of your veins. °· You may   receive contrast through this tube. A contrast is an injection that improves the quality of the pictures from your heart. °· A gel will be applied to your chest. °· A wand-like tool (transducer) will be moved over your chest. The gel will help to transmit the sound waves from the transducer. °· The sound waves will harmlessly bounce off of your heart to  allow the heart images to be captured in real-time motion. The images will be recorded on a computer. °The procedure may vary among health care providers and hospitals. °What happens after the procedure? °· You may return to your normal, everyday life, including diet, activities, and medicines, unless your health care provider tells you not to do that. °Summary °· An echocardiogram is a procedure that uses painless sound waves (ultrasound) to produce an image of the heart. °· Images from an echocardiogram can provide important information about the size and shape of your heart, heart muscle function, heart valve function, and fluid buildup around your heart. °· You do not need to do anything to prepare before this procedure. You may eat and drink normally. °· After the echocardiogram is completed, you may return to your normal, everyday life, unless your health care provider tells you not to do that. °This information is not intended to replace advice given to you by your health care provider. Make sure you discuss any questions you have with your health care provider. °Document Revised: 08/14/2018 Document Reviewed: 05/26/2016 °Elsevier Patient Education © 2020 Elsevier Inc. ° ° °

## 2019-12-22 NOTE — Addendum Note (Signed)
Addended by: Ashok Norris on: 12/22/2019 04:10 PM   Modules accepted: Orders

## 2019-12-22 NOTE — Progress Notes (Signed)
Cardiology Office Note:    Date:  12/22/2019   ID:  Benjamin Woods, DOB [birth year], MRN [id number]  PCP:  Helen Hashimoto., MD  Cardiologist:  Jenne Campus, MD    Referring MD: Helen Hashimoto., MD   No chief complaint on file. I am doing fine  History of Present Illness:    Benjamin Woods is a 79 y.o. male with past medical history significant for DVT, PE, CVA, cardiomyopathy recently discovered in December last year ejection fraction originally 25 to 30% however then improved to 4045%.  He comes today 2 months for follow-up he comes with his daughter.  He tells me he is doing fine but when he compares himself to himself year ago he said he is worse in was tired he is exhausted.  Still able to walk with his friends and socialize.  Denies have any chest pain tightness squeezing pressure burning chest no passing out.  Past Medical History:  Diagnosis Date  . Acute combined systolic and diastolic congestive heart failure ([place]) 12/10/2018  . Acute combined systolic and diastolic heart failure ([place])   . BPH (benign prostatic hyperplasia)   . Cardiomyopathy ([place])   . DVT (deep venous thrombosis) ([place]) 12/10/2018  . Elevated troponin 12/10/2018  . GERD (gastroesophageal reflux disease)   . Gout   . HTN (hypertension)   . Hypocalcemia   . Hypokalemia   . Pressure injury of skin 12/11/2018  . Protein-calorie malnutrition, moderate ([place]) 12/10/2018  . Pulmonary embolism ([place])   . Stroke (cerebrum) ([place])   . Syncope   . Systolic congestive heart failure ([place]) 01/30/2019    Past Surgical History:  Procedure Laterality Date  . HAND SURGERY      Current Medications: Current Meds  Medication Sig  . apixaban (ELIQUIS) 5 MG TABS tablet Take 2 tablets twice daily until 12/15/18, then continue with 1 tablet twice daily starting on 12/16/18.  Marland Kitchen carvedilol (COREG) 25 MG tablet Take 25 mg by mouth 2 (two) times daily with a meal.   . DEXILANT 60 MG capsule Take 1 capsule by mouth  daily as needed.  Marland Kitchen ENTRESTO 24-26 MG Take 1 tablet by mouth 2 (two) times daily.  . Febuxostat 80 MG TABS Take 80 mg by mouth daily.  . furosemide (LASIX) 20 MG tablet Take 1 tablet (20 mg total) by mouth daily.  Marland Kitchen MITIGARE 0.6 MG CAPS   . sildenafil (REVATIO) 20 MG tablet Take 20 mg by mouth as needed.  . sildenafil (VIAGRA) 100 MG tablet Take 100 mg by mouth daily as needed for erectile dysfunction. One hour prior to sex  . tamsulosin (FLOMAX) 0.4 MG CAPS capsule Take 0.4 mg by mouth daily.  . timolol (TIMOPTIC) 0.5 % ophthalmic solution Place 1 drop into both eyes daily.      Allergies:   Patient has no known allergies.   Social History   Socioeconomic History  . Marital status: Married    Spouse name: Not on file  . Number of children: Not on file  . Years of education: Not on file  . Highest education level: Not on file  Occupational History  . Not on file  Tobacco Use  . Smoking status: Former Research scientist (life sciences)  . Smokeless tobacco: Never Used  Vaping Use  . Vaping Use: Never used  Substance and Sexual Activity  . Alcohol use: Not Currently  . Drug use: Never  . Sexual activity: Not Currently  Other Topics Concern  . Not on file  Social History Narrative  . Not on file   Social Determinants of Health   Financial Resource Strain:   . Difficulty of Paying Living Expenses:   Food Insecurity:   . Worried About Charity fundraiser in the Last Year:   . Arboriculturist in the Last Year:   Transportation Needs:   . Film/video editor (Medical):   Marland Kitchen Lack of Transportation (Non-Medical):   Physical Activity:   . Days of Exercise per Week:   . Minutes of Exercise per Session:   Stress:   . Feeling of Stress :   Social Connections:   . Frequency of Communication with Friends and Family:   . Frequency of Social Gatherings with Friends and Family:   . Attends Religious Services:   . Active Member of Clubs or Organizations:   . Attends Archivist Meetings:   Marland Kitchen  Marital Status:      Family History: The patient's family history is not on file. ROS:   Please see the history of present illness.    All 14 point review of systems negative except as described per history of present illness  EKGs/Labs/Other Studies Reviewed:      Recent Labs: 03/27/2019: BUN 39; Creatinine, Ser 1.40; Potassium 4.7; Sodium 139  Recent Lipid Panel No results found for: CHOL, TRIG, HDL, CHOLHDL, VLDL, LDLCALC, LDLDIRECT  Physical Exam:    VS:  BP 128/78   Pulse (!) 57   Ht 6' (1.829 m)   Wt 200 lb 3.2 oz (90.8 kg)   SpO2 96%   BMI 27.15 kg/m     Wt Readings from Last 3 Encounters:  12/22/19 200 lb 3.2 oz (90.8 kg)  08/13/19 199 lb 3.2 oz (90.4 kg)  03/27/19 195 lb (88.5 kg)     GEN:  Well nourished, well developed in no acute distress HEENT: Normal NECK: No JVD; No carotid bruits LYMPHATICS: No lymphadenopathy CARDIAC: RRR, no murmurs, no rubs, no gallops RESPIRATORY:  Clear to auscultation without rales, wheezing or rhonchi  ABDOMEN: Soft, non-tender, non-distended MUSCULOSKELETAL:  No edema; No deformity  SKIN: Warm and dry LOWER EXTREMITIES: no swelling NEUROLOGIC:  Alert and oriented x 3 PSYCHIATRIC:  Normal affect   ASSESSMENT:    1. Chronic systolic congestive heart failure ([place])   2. Dilated cardiomyopathy ([place])   3. History of pulmonary embolism    PLAN:    In order of problems listed above:  1. Chronic systolic congestive heart failure.  He complained of being weak tired exhausted.  EKG done today shows sinus bradycardia rate 57 first-degree AV block.  I wanted this portion of his weakness fatigue and tiredness could be related to carvedilol.  I will ask him to repeat echocardiogram, based on that we will decide about carvedilol if his function is better than it was before I will give him a trial to smaller dose of carvedilol to see if he feels any better.  If not we will continue present dose.  In the meantime we will continue with  Entresto. 2. Dilated cardiomyopathy: Plan as outlined above. 3. History of pulmonary emboli, he is a anticoagulated which I will continue. 4. History of CVA.  No new findings. 5. Dyslipidemia: I did review K PN which showed me his LDL of 69 and HDL 29 this is from 10/27/2019.  We will continue conservative approach.   Medication Adjustments/Labs and Tests Ordered: Current medicines are reviewed at length with the patient today.  Concerns regarding medicines  are outlined above.  No orders of the defined types were placed in this encounter.  Medication changes: No orders of the defined types were placed in this encounter.   Signed, Park Liter, MD, [facility] 12/22/2019 3:53 PM    [place]

## 2020-01-07 DIAGNOSIS — N401 Enlarged prostate with lower urinary tract symptoms: Secondary | ICD-10-CM | POA: Diagnosis not present

## 2020-01-07 DIAGNOSIS — N411 Chronic prostatitis: Secondary | ICD-10-CM | POA: Diagnosis not present

## 2020-01-07 DIAGNOSIS — N529 Male erectile dysfunction, unspecified: Secondary | ICD-10-CM | POA: Diagnosis not present

## 2020-01-13 ENCOUNTER — Ambulatory Visit (INDEPENDENT_AMBULATORY_CARE_PROVIDER_SITE_OTHER): Payer: Medicare HMO

## 2020-01-13 ENCOUNTER — Other Ambulatory Visit: Payer: Self-pay

## 2020-01-13 DIAGNOSIS — I420 Dilated cardiomyopathy: Secondary | ICD-10-CM | POA: Diagnosis not present

## 2020-01-13 NOTE — Progress Notes (Signed)
Complete echocardiogram performed.  Jimmy Gage Weant RDCS, RVT  

## 2020-01-18 LAB — ECHOCARDIOGRAM COMPLETE
Area-P 1/2: 2.26 cm2
S' Lateral: 3.3 cm

## 2020-01-19 ENCOUNTER — Telehealth: Payer: Self-pay | Admitting: Emergency Medicine

## 2020-01-19 NOTE — Telephone Encounter (Signed)
Follow Up:    Returning your call, concerning his Echo results.

## 2020-01-19 NOTE — Telephone Encounter (Signed)
Called patient back informed him of results.

## 2020-01-25 ENCOUNTER — Telehealth: Payer: Self-pay | Admitting: Cardiology

## 2020-01-25 DIAGNOSIS — Z139 Encounter for screening, unspecified: Secondary | ICD-10-CM | POA: Diagnosis not present

## 2020-01-25 DIAGNOSIS — R040 Epistaxis: Secondary | ICD-10-CM | POA: Diagnosis not present

## 2020-01-25 DIAGNOSIS — Z6828 Body mass index (BMI) 28.0-28.9, adult: Secondary | ICD-10-CM | POA: Diagnosis not present

## 2020-01-25 DIAGNOSIS — Z79899 Other long term (current) drug therapy: Secondary | ICD-10-CM | POA: Diagnosis not present

## 2020-01-25 NOTE — Telephone Encounter (Signed)
Left message for Terri the pts daughter on DPR to call back. Returning her call re: the pts Echo report.   [place], [place] [phone]

## 2020-01-25 NOTE — Telephone Encounter (Signed)
New message:    Patient daughter calling to get results from the patient ECHO. Please call back.

## 2020-01-27 NOTE — Telephone Encounter (Signed)
Patient's daughter following up. Please advise.

## 2020-01-27 NOTE — Telephone Encounter (Signed)
I spoke with patient's daughter and reviewed echo results with her.

## 2020-02-17 DIAGNOSIS — H2511 Age-related nuclear cataract, right eye: Secondary | ICD-10-CM | POA: Diagnosis not present

## 2020-02-23 DIAGNOSIS — Z1331 Encounter for screening for depression: Secondary | ICD-10-CM | POA: Diagnosis not present

## 2020-02-23 DIAGNOSIS — E785 Hyperlipidemia, unspecified: Secondary | ICD-10-CM | POA: Diagnosis not present

## 2020-02-23 DIAGNOSIS — M109 Gout, unspecified: Secondary | ICD-10-CM | POA: Diagnosis not present

## 2020-02-23 DIAGNOSIS — I10 Essential (primary) hypertension: Secondary | ICD-10-CM | POA: Diagnosis not present

## 2020-02-23 DIAGNOSIS — N184 Chronic kidney disease, stage 4 (severe): Secondary | ICD-10-CM | POA: Diagnosis not present

## 2020-02-23 DIAGNOSIS — Z9181 History of falling: Secondary | ICD-10-CM | POA: Diagnosis not present

## 2020-02-23 DIAGNOSIS — N529 Male erectile dysfunction, unspecified: Secondary | ICD-10-CM | POA: Diagnosis not present

## 2020-02-23 DIAGNOSIS — K219 Gastro-esophageal reflux disease without esophagitis: Secondary | ICD-10-CM | POA: Diagnosis not present

## 2020-02-23 DIAGNOSIS — Z79899 Other long term (current) drug therapy: Secondary | ICD-10-CM | POA: Diagnosis not present

## 2020-06-01 DIAGNOSIS — N401 Enlarged prostate with lower urinary tract symptoms: Secondary | ICD-10-CM | POA: Diagnosis not present

## 2020-06-01 DIAGNOSIS — N529 Male erectile dysfunction, unspecified: Secondary | ICD-10-CM | POA: Diagnosis not present

## 2020-06-01 DIAGNOSIS — Z125 Encounter for screening for malignant neoplasm of prostate: Secondary | ICD-10-CM | POA: Diagnosis not present

## 2020-06-14 DIAGNOSIS — I5033 Acute on chronic diastolic (congestive) heart failure: Secondary | ICD-10-CM | POA: Insufficient documentation

## 2020-06-14 DIAGNOSIS — I5043 Acute on chronic combined systolic (congestive) and diastolic (congestive) heart failure: Secondary | ICD-10-CM | POA: Insufficient documentation

## 2020-06-14 DIAGNOSIS — E785 Hyperlipidemia, unspecified: Secondary | ICD-10-CM | POA: Diagnosis not present

## 2020-06-14 DIAGNOSIS — Z0000 Encounter for general adult medical examination without abnormal findings: Secondary | ICD-10-CM | POA: Diagnosis not present

## 2020-06-14 DIAGNOSIS — I5041 Acute combined systolic (congestive) and diastolic (congestive) heart failure: Secondary | ICD-10-CM | POA: Insufficient documentation

## 2020-06-14 DIAGNOSIS — I2699 Other pulmonary embolism without acute cor pulmonale: Secondary | ICD-10-CM | POA: Insufficient documentation

## 2020-06-14 DIAGNOSIS — E876 Hypokalemia: Secondary | ICD-10-CM | POA: Insufficient documentation

## 2020-06-14 DIAGNOSIS — Z9181 History of falling: Secondary | ICD-10-CM | POA: Diagnosis not present

## 2020-06-14 DIAGNOSIS — K219 Gastro-esophageal reflux disease without esophagitis: Secondary | ICD-10-CM | POA: Insufficient documentation

## 2020-06-14 DIAGNOSIS — Z1331 Encounter for screening for depression: Secondary | ICD-10-CM | POA: Diagnosis not present

## 2020-06-23 ENCOUNTER — Other Ambulatory Visit: Payer: Self-pay

## 2020-06-23 ENCOUNTER — Encounter: Payer: Self-pay | Admitting: Cardiology

## 2020-06-23 ENCOUNTER — Ambulatory Visit: Payer: Medicare HMO | Admitting: Cardiology

## 2020-06-23 VITALS — BP 136/72 | HR 61 | Ht 70.0 in | Wt 218.0 lb

## 2020-06-23 DIAGNOSIS — I5022 Chronic systolic (congestive) heart failure: Secondary | ICD-10-CM

## 2020-06-23 DIAGNOSIS — Z86711 Personal history of pulmonary embolism: Secondary | ICD-10-CM | POA: Diagnosis not present

## 2020-06-23 DIAGNOSIS — I420 Dilated cardiomyopathy: Secondary | ICD-10-CM | POA: Diagnosis not present

## 2020-06-23 DIAGNOSIS — I10 Essential (primary) hypertension: Secondary | ICD-10-CM | POA: Diagnosis not present

## 2020-06-23 MED ORDER — CARVEDILOL 12.5 MG PO TABS: 12.5000 mg | ORAL_TABLET | Freq: Two times a day (BID) | ORAL | 2 refills | Status: DC

## 2020-06-23 NOTE — Addendum Note (Signed)
Addended by: Senaida Ores on: 06/23/2020 03:21 PM   Modules accepted: Orders

## 2020-06-23 NOTE — Progress Notes (Signed)
Cardiology Office Note:    Date:  06/23/2020   ID:  Benjamin Woods, DOB 09-Aug-1940, MRN [id number]  PCP:  Helen Hashimoto., MD  Cardiologist:  Jenne Campus, MD    Referring MD: Helen Hashimoto., MD   Chief Complaint  Patient presents with  . Follow-up  I am doing fine  History of Present Illness:    Benjamin Woods is a 80 y.o. male with past medical history significant for cardiomyopathy recently discovered in December 2020 with ejection fraction 25 to 30% that improved to 4045% and latest echocardiogram showed ejection fraction 50 to 55%.  His past medical history is also significant for DVT/PE, CVA, kidney dysfunction.  Hypertension. He comes today 2 months for follow-up.  Denies have any complaints there is no chest pain tightness squeezing pressure burning chest but she is weak and tired like usually.  Past Medical History:  Diagnosis Date  . Acute combined systolic and diastolic congestive heart failure ([place]) 12/10/2018  . Acute combined systolic and diastolic heart failure ([place])   . BPH (benign prostatic hyperplasia)   . Cardiomyopathy ([place])   . DVT (deep venous thrombosis) ([place]) 12/10/2018  . Elevated troponin 12/10/2018  . GERD (gastroesophageal reflux disease)   . Gout   . History of pulmonary embolism 12/22/2019  . HTN (hypertension)   . Hypocalcemia   . Hypokalemia   . Pressure injury of skin 12/11/2018  . Protein-calorie malnutrition, moderate ([place]) 12/10/2018  . Pulmonary embolism ([place])   . Stroke (cerebrum) ([place])   . Syncope   . Systolic congestive heart failure ([place]) 01/30/2019    Past Surgical History:  Procedure Laterality Date  . HAND SURGERY      Current Medications: Current Meds  Medication Sig  . apixaban (ELIQUIS) 5 MG TABS tablet Take 2 tablets twice daily until 12/15/18, then continue with 1 tablet twice daily starting on 12/16/18.  Marland Kitchen carvedilol (COREG) 25 MG tablet Take 25 mg by mouth 2 (two) times daily with a meal.   . DEXILANT 60  MG capsule Take 1 capsule by mouth daily as needed (GERD).  Marland Kitchen ENTRESTO 24-26 MG Take 1 tablet by mouth 2 (two) times daily.  . Febuxostat 80 MG TABS Take 80 mg by mouth daily.  . furosemide (LASIX) 20 MG tablet Take 1 tablet (20 mg total) by mouth daily.  Marland Kitchen MITIGARE 0.6 MG CAPS Take 1 tablet by mouth as needed (For gout).  . sildenafil (REVATIO) 20 MG tablet Take 20 mg by mouth as needed (ED).  . sildenafil (VIAGRA) 100 MG tablet Take 100 mg by mouth daily as needed for erectile dysfunction. One hour prior to sex  . tamsulosin (FLOMAX) 0.4 MG CAPS capsule Take 0.4 mg by mouth daily.  . timolol (TIMOPTIC) 0.5 % ophthalmic solution Place 1 drop into both eyes daily.      Allergies:   Patient has no known allergies.   Social History   Socioeconomic History  . Marital status: Married    Spouse name: Not on file  . Number of children: Not on file  . Years of education: Not on file  . Highest education level: Not on file  Occupational History  . Not on file  Tobacco Use  . Smoking status: Former Research scientist (life sciences)  . Smokeless tobacco: Never Used  Vaping Use  . Vaping Use: Never used  Substance and Sexual Activity  . Alcohol use: Not Currently  . Drug use: Never  . Sexual activity: Not Currently  Other Topics  Concern  . Not on file  Social History Narrative  . Not on file   Social Determinants of Health   Financial Resource Strain: Not on file  Food Insecurity: Not on file  Transportation Needs: Not on file  Physical Activity: Not on file  Stress: Not on file  Social Connections: Not on file     Family History: The patient's family history is not on file. ROS:   Please see the history of present illness.    All 14 point review of systems negative except as described per history of present illness  EKGs/Labs/Other Studies Reviewed:      Recent Labs: No results found for requested labs within last 8760 hours.  Recent Lipid Panel No results found for: CHOL, TRIG, HDL, CHOLHDL,  VLDL, LDLCALC, LDLDIRECT  Physical Exam:    VS:  BP 136/72 (BP Location: Left Arm, Patient Position: Sitting)   Pulse 61   Ht '5\' 10"'$  (1.778 m)   Wt 218 lb (98.9 kg)   SpO2 97%   BMI 31.28 kg/m     Wt Readings from Last 3 Encounters:  06/23/20 218 lb (98.9 kg)  12/22/19 200 lb 3.2 oz (90.8 kg)  08/13/19 199 lb 3.2 oz (90.4 kg)     GEN:  Well nourished, well developed in no acute distress HEENT: Normal NECK: No JVD; No carotid bruits LYMPHATICS: No lymphadenopathy CARDIAC: RRR, no murmurs, no rubs, no gallops RESPIRATORY:  Clear to auscultation without rales, wheezing or rhonchi  ABDOMEN: Soft, non-tender, non-distended MUSCULOSKELETAL:  No edema; No deformity  SKIN: Warm and dry LOWER EXTREMITIES: no swelling NEUROLOGIC:  Alert and oriented x 3 PSYCHIATRIC:  Normal affect   ASSESSMENT:    1. Dilated cardiomyopathy ([place])   2. Chronic systolic congestive heart failure ([place])   3. History of pulmonary embolism   4. Primary hypertension    PLAN:    In order of problems listed above:  1. Dilated cardiomyopathy with normalization we did talk in length about what to do with the situation he feels lousy weak tired I am worried that bradycardia and high dose of beta-blocker may be responsible for it since ejection fraction normalized I will try to cut down temporarily carvedilol to only 12.5 twice daily to see if he improve any of his symptoms if that is the case then will continue with small dose of carvedilol if not I asked him to go back to 25 twice daily. 2. Chronic systolic and breast justified very, compensated on appropriate medications. 3. History of pulmonary emboli.  On Eliquis which I will continue. 4. Essential hypertension blood pressure well controlled continue present management. 5. Overall he is doing well I did review his K PN today which show me LDL of 3093 and HDL of 38 this is from 02/23/2020.  We will recheck fasting lipid profile next time when I see him  here   Medication Adjustments/Labs and Tests Ordered: Current medicines are reviewed at length with the patient today.  Concerns regarding medicines are outlined above.  No orders of the defined types were placed in this encounter.  Medication changes: No orders of the defined types were placed in this encounter.   Signed, Park Liter, MD, [facility] 06/23/2020 3:15 PM    [place]

## 2020-06-23 NOTE — Patient Instructions (Signed)
Medication Instructions:  Your physician has recommended you make the following change in your medication:   DECREASE: Carvedilol 12.5 mg twice daily  *If you need a refill on your cardiac medications before your next appointment, please call your pharmacy*   Lab Work: None If you have labs (blood work) drawn today and your tests are completely normal, you will receive your results only by: Marland Kitchen MyChart Message (if you have MyChart) OR . A paper copy in the mail If you have any lab test that is abnormal or we need to change your treatment, we will call you to review the results.   Testing/Procedures: none   Follow-Up: At [facility], you and your health needs are our priority.  As part of our continuing mission to provide you with exceptional heart care, we have created designated Provider Care Teams.  These Care Teams include your primary Cardiologist (physician) and Advanced Practice Providers (APPs -  Physician Assistants and Nurse Practitioners) who all work together to provide you with the care you need, when you need it.  We recommend signing up for the patient portal called "MyChart".  Sign up information is provided on this After Visit Summary.  MyChart is used to connect with patients for Virtual Visits (Telemedicine).  Patients are able to view lab/test results, encounter notes, upcoming appointments, etc.  Non-urgent messages can be sent to your provider as well.   To learn more about what you can do with MyChart, go to [email or web address].    Your next appointment:   5 month(s)  The format for your next appointment:   In Person  Provider:   Jenne Campus, MD   Other Instructions

## 2020-07-12 DIAGNOSIS — E663 Overweight: Secondary | ICD-10-CM | POA: Diagnosis not present

## 2020-07-12 DIAGNOSIS — I10 Essential (primary) hypertension: Secondary | ICD-10-CM | POA: Diagnosis not present

## 2020-07-12 DIAGNOSIS — M109 Gout, unspecified: Secondary | ICD-10-CM | POA: Diagnosis not present

## 2020-07-12 DIAGNOSIS — K219 Gastro-esophageal reflux disease without esophagitis: Secondary | ICD-10-CM | POA: Diagnosis not present

## 2020-07-12 DIAGNOSIS — Z79899 Other long term (current) drug therapy: Secondary | ICD-10-CM | POA: Diagnosis not present

## 2020-07-12 DIAGNOSIS — E785 Hyperlipidemia, unspecified: Secondary | ICD-10-CM | POA: Diagnosis not present

## 2020-07-12 DIAGNOSIS — N529 Male erectile dysfunction, unspecified: Secondary | ICD-10-CM | POA: Diagnosis not present

## 2020-07-12 DIAGNOSIS — Z6829 Body mass index (BMI) 29.0-29.9, adult: Secondary | ICD-10-CM | POA: Diagnosis not present

## 2020-11-22 DIAGNOSIS — I10 Essential (primary) hypertension: Secondary | ICD-10-CM | POA: Diagnosis not present

## 2020-11-22 DIAGNOSIS — N529 Male erectile dysfunction, unspecified: Secondary | ICD-10-CM | POA: Diagnosis not present

## 2020-11-22 DIAGNOSIS — K219 Gastro-esophageal reflux disease without esophagitis: Secondary | ICD-10-CM | POA: Diagnosis not present

## 2020-11-22 DIAGNOSIS — M109 Gout, unspecified: Secondary | ICD-10-CM | POA: Diagnosis not present

## 2020-11-22 DIAGNOSIS — E663 Overweight: Secondary | ICD-10-CM | POA: Diagnosis not present

## 2020-11-22 DIAGNOSIS — Z6829 Body mass index (BMI) 29.0-29.9, adult: Secondary | ICD-10-CM | POA: Diagnosis not present

## 2020-11-22 DIAGNOSIS — E785 Hyperlipidemia, unspecified: Secondary | ICD-10-CM | POA: Diagnosis not present

## 2020-11-22 DIAGNOSIS — Z79899 Other long term (current) drug therapy: Secondary | ICD-10-CM | POA: Diagnosis not present

## 2020-12-01 ENCOUNTER — Encounter: Payer: Self-pay | Admitting: Cardiology

## 2020-12-01 ENCOUNTER — Ambulatory Visit: Payer: Medicare HMO | Admitting: Cardiology

## 2020-12-01 ENCOUNTER — Other Ambulatory Visit: Payer: Self-pay

## 2020-12-01 VITALS — BP 138/80 | HR 70 | Ht 72.0 in | Wt 215.4 lb

## 2020-12-01 DIAGNOSIS — I420 Dilated cardiomyopathy: Secondary | ICD-10-CM

## 2020-12-01 DIAGNOSIS — I10 Essential (primary) hypertension: Secondary | ICD-10-CM | POA: Diagnosis not present

## 2020-12-01 DIAGNOSIS — Z86711 Personal history of pulmonary embolism: Secondary | ICD-10-CM

## 2020-12-01 DIAGNOSIS — E785 Hyperlipidemia, unspecified: Secondary | ICD-10-CM

## 2020-12-01 NOTE — Patient Instructions (Signed)

## 2020-12-01 NOTE — Addendum Note (Signed)
Addended by: Resa Miner I on: 12/01/2020 04:05 PM   Modules accepted: Orders

## 2020-12-01 NOTE — Progress Notes (Signed)
Cardiology Office Note:    Date:  12/01/2020   ID:  Benjamin Woods, DOB 1940-11-17, MRN [id number]  PCP:  Helen Hashimoto., MD  Cardiologist:  Jenne Campus, MD    Referring MD: Helen Hashimoto., MD   Chief Complaint  Patient presents with   Follow-up  I am doing fine  History of Present Illness:    Benjamin Woods is a 80 y.o. male with past medical history significant for cardiomyopathy which was discovered in December 2020 at that time his ejection fraction was 25 to 30% later with proper management April 4045 latest echocardiogram from last year showed 50 to 55%.  Also when he was in the hospital he had CVA as well as kidney dysfunction and hypertension. He is coming today to my office for follow-up overall doing very well.  Denies have any chest pain tightness squeezing pressure burning chest no palpitations.  Overall doing well  Past Medical History:  Diagnosis Date   Acute combined systolic and diastolic congestive heart failure ([place]) 12/10/2018   Acute combined systolic and diastolic heart failure (HCC)    BPH (benign prostatic hyperplasia)    Cardiomyopathy (HCC)    DVT (deep venous thrombosis) ([place]) 12/10/2018   Elevated troponin 12/10/2018   GERD (gastroesophageal reflux disease)    Gout    History of pulmonary embolism 12/22/2019   HTN (hypertension)    Hypocalcemia    Hypokalemia    Pressure injury of skin 12/11/2018   Protein-calorie malnutrition, moderate ([place]) 12/10/2018   Pulmonary embolism (HCC)    Stroke (cerebrum) (HCC)    Syncope    Systolic congestive heart failure ([place]) 01/30/2019    Past Surgical History:  Procedure Laterality Date   HAND SURGERY      Current Medications: Current Meds  Medication Sig   apixaban (ELIQUIS) 5 MG TABS tablet Take 2 tablets twice daily until 12/15/18, then continue with 1 tablet twice daily starting on 12/16/18. (Patient taking differently: Take 5 mg by mouth 2 (two) times daily. Take 2 tablets twice daily  until 12/15/18, then continue with 1 tablet twice daily starting on 12/16/18.)   carvedilol (COREG) 12.5 MG tablet Take 1 tablet (12.5 mg total) by mouth 2 (two) times daily with a meal.   DEXILANT 60 MG capsule Take 1 capsule by mouth daily as needed (GERD).   ENTRESTO 24-26 MG Take 1 tablet by mouth 2 (two) times daily.   Febuxostat 80 MG TABS Take 80 mg by mouth daily.   furosemide (LASIX) 20 MG tablet Take 1 tablet (20 mg total) by mouth daily.   MITIGARE 0.6 MG CAPS Take 1 tablet by mouth as needed (For gout).   sildenafil (REVATIO) 20 MG tablet Take 20 mg by mouth as needed (ED).   sildenafil (VIAGRA) 100 MG tablet Take 100 mg by mouth daily as needed for erectile dysfunction. One hour prior to sex   tamsulosin (FLOMAX) 0.4 MG CAPS capsule Take 0.4 mg by mouth daily.   timolol (TIMOPTIC) 0.5 % ophthalmic solution Place 1 drop into both eyes daily.      Allergies:   Patient has no known allergies.   Social History   Socioeconomic History   Marital status: Married    Spouse name: Not on file   Number of children: Not on file   Years of education: Not on file   Highest education level: Not on file  Occupational History   Not on file  Tobacco Use   Smoking status: Former  Smokeless tobacco: Never  Vaping Use   Vaping Use: Never used  Substance and Sexual Activity   Alcohol use: Not Currently   Drug use: Never   Sexual activity: Not Currently  Other Topics Concern   Not on file  Social History Narrative   Not on file   Social Determinants of Health   Financial Resource Strain: Not on file  Food Insecurity: Not on file  Transportation Needs: Not on file  Physical Activity: Not on file  Stress: Not on file  Social Connections: Not on file     Family History: The patient's family history is not on file. ROS:   Please see the history of present illness.    All 14 point review of systems negative except as described per history of present illness  EKGs/Labs/Other  Studies Reviewed:      Recent Labs: No results found for requested labs within last 8760 hours.  Recent Lipid Panel No results found for: CHOL, TRIG, HDL, CHOLHDL, VLDL, LDLCALC, LDLDIRECT  Physical Exam:    VS:  BP 138/80 (BP Location: Left Arm, Patient Position: Sitting)   Pulse 70   Ht 6' (1.829 m)   Wt 215 lb 6.4 oz (97.7 kg)   SpO2 92%   BMI 29.21 kg/m     Wt Readings from Last 3 Encounters:  12/01/20 215 lb 6.4 oz (97.7 kg)  06/23/20 218 lb (98.9 kg)  12/22/19 200 lb 3.2 oz (90.8 kg)     GEN:  Well nourished, well developed in no acute distress HEENT: Normal NECK: No JVD; No carotid bruits LYMPHATICS: No lymphadenopathy CARDIAC: RRR, no murmurs, no rubs, no gallops RESPIRATORY:  Clear to auscultation without rales, wheezing or rhonchi  ABDOMEN: Soft, non-tender, non-distended MUSCULOSKELETAL:  No edema; No deformity  SKIN: Warm and dry LOWER EXTREMITIES: no swelling NEUROLOGIC:  Alert and oriented x 3 PSYCHIATRIC:  Normal affect   ASSESSMENT:    1. History of pulmonary embolism   2. Dilated cardiomyopathy ([place])   3. Primary hypertension   4. Dyslipidemia    PLAN:    In order of problems listed above:  History of pulmonary emboli, unprovoked, he is anticoagulated with Eliquis which I will continue History of dilated cardiomyopathy with new to normalization of ejection fraction he is on carvedilol 12.5 twice daily as well as Entresto 24-26 which I will continue.  I did review K PN which show me his kidney function of creatinine 1.6, therefore I will not be able to increase dose of Entresto.  Luckily his left ventricle ejection fraction seems to be improving quite significantly.  In September we will repeat another echocardiogram Essential hypertension blood pressure well controlled Dyslipidemia I did review K PN which show LDL of 94 HDL 38 he is on no medication however he will benefit from statin.  I initiated conversation he is somewhat reluctant now.  We  will continue this discussion   Medication Adjustments/Labs and Tests Ordered: Current medicines are reviewed at length with the patient today.  Concerns regarding medicines are outlined above.  No orders of the defined types were placed in this encounter.  Medication changes: No orders of the defined types were placed in this encounter.   Signed, Park Liter, MD, [facility] 12/01/2020 2:17 PM    [place]

## 2020-12-07 DIAGNOSIS — N529 Male erectile dysfunction, unspecified: Secondary | ICD-10-CM | POA: Diagnosis not present

## 2020-12-07 DIAGNOSIS — N401 Enlarged prostate with lower urinary tract symptoms: Secondary | ICD-10-CM | POA: Diagnosis not present

## 2020-12-21 ENCOUNTER — Other Ambulatory Visit: Payer: Self-pay | Admitting: Cardiology

## 2021-03-13 ENCOUNTER — Other Ambulatory Visit: Payer: Self-pay | Admitting: Cardiology

## 2021-03-28 DIAGNOSIS — E663 Overweight: Secondary | ICD-10-CM | POA: Diagnosis not present

## 2021-03-28 DIAGNOSIS — Z6829 Body mass index (BMI) 29.0-29.9, adult: Secondary | ICD-10-CM | POA: Diagnosis not present

## 2021-03-28 DIAGNOSIS — Z79899 Other long term (current) drug therapy: Secondary | ICD-10-CM | POA: Diagnosis not present

## 2021-03-28 DIAGNOSIS — K219 Gastro-esophageal reflux disease without esophagitis: Secondary | ICD-10-CM | POA: Diagnosis not present

## 2021-03-28 DIAGNOSIS — M109 Gout, unspecified: Secondary | ICD-10-CM | POA: Diagnosis not present

## 2021-03-28 DIAGNOSIS — E785 Hyperlipidemia, unspecified: Secondary | ICD-10-CM | POA: Diagnosis not present

## 2021-03-28 DIAGNOSIS — N529 Male erectile dysfunction, unspecified: Secondary | ICD-10-CM | POA: Diagnosis not present

## 2021-03-28 DIAGNOSIS — I10 Essential (primary) hypertension: Secondary | ICD-10-CM | POA: Diagnosis not present

## 2021-05-13 ENCOUNTER — Other Ambulatory Visit: Payer: Self-pay | Admitting: Cardiology

## 2021-05-27 IMAGING — CR CHEST - 2 VIEW
2 series · 2 of 2 positions shown · non-contrast
Comparison: December 07, 2018

CLINICAL DATA: Pulmonary embolus.

EXAM:
CHEST - 2 VIEW

[chest lat]
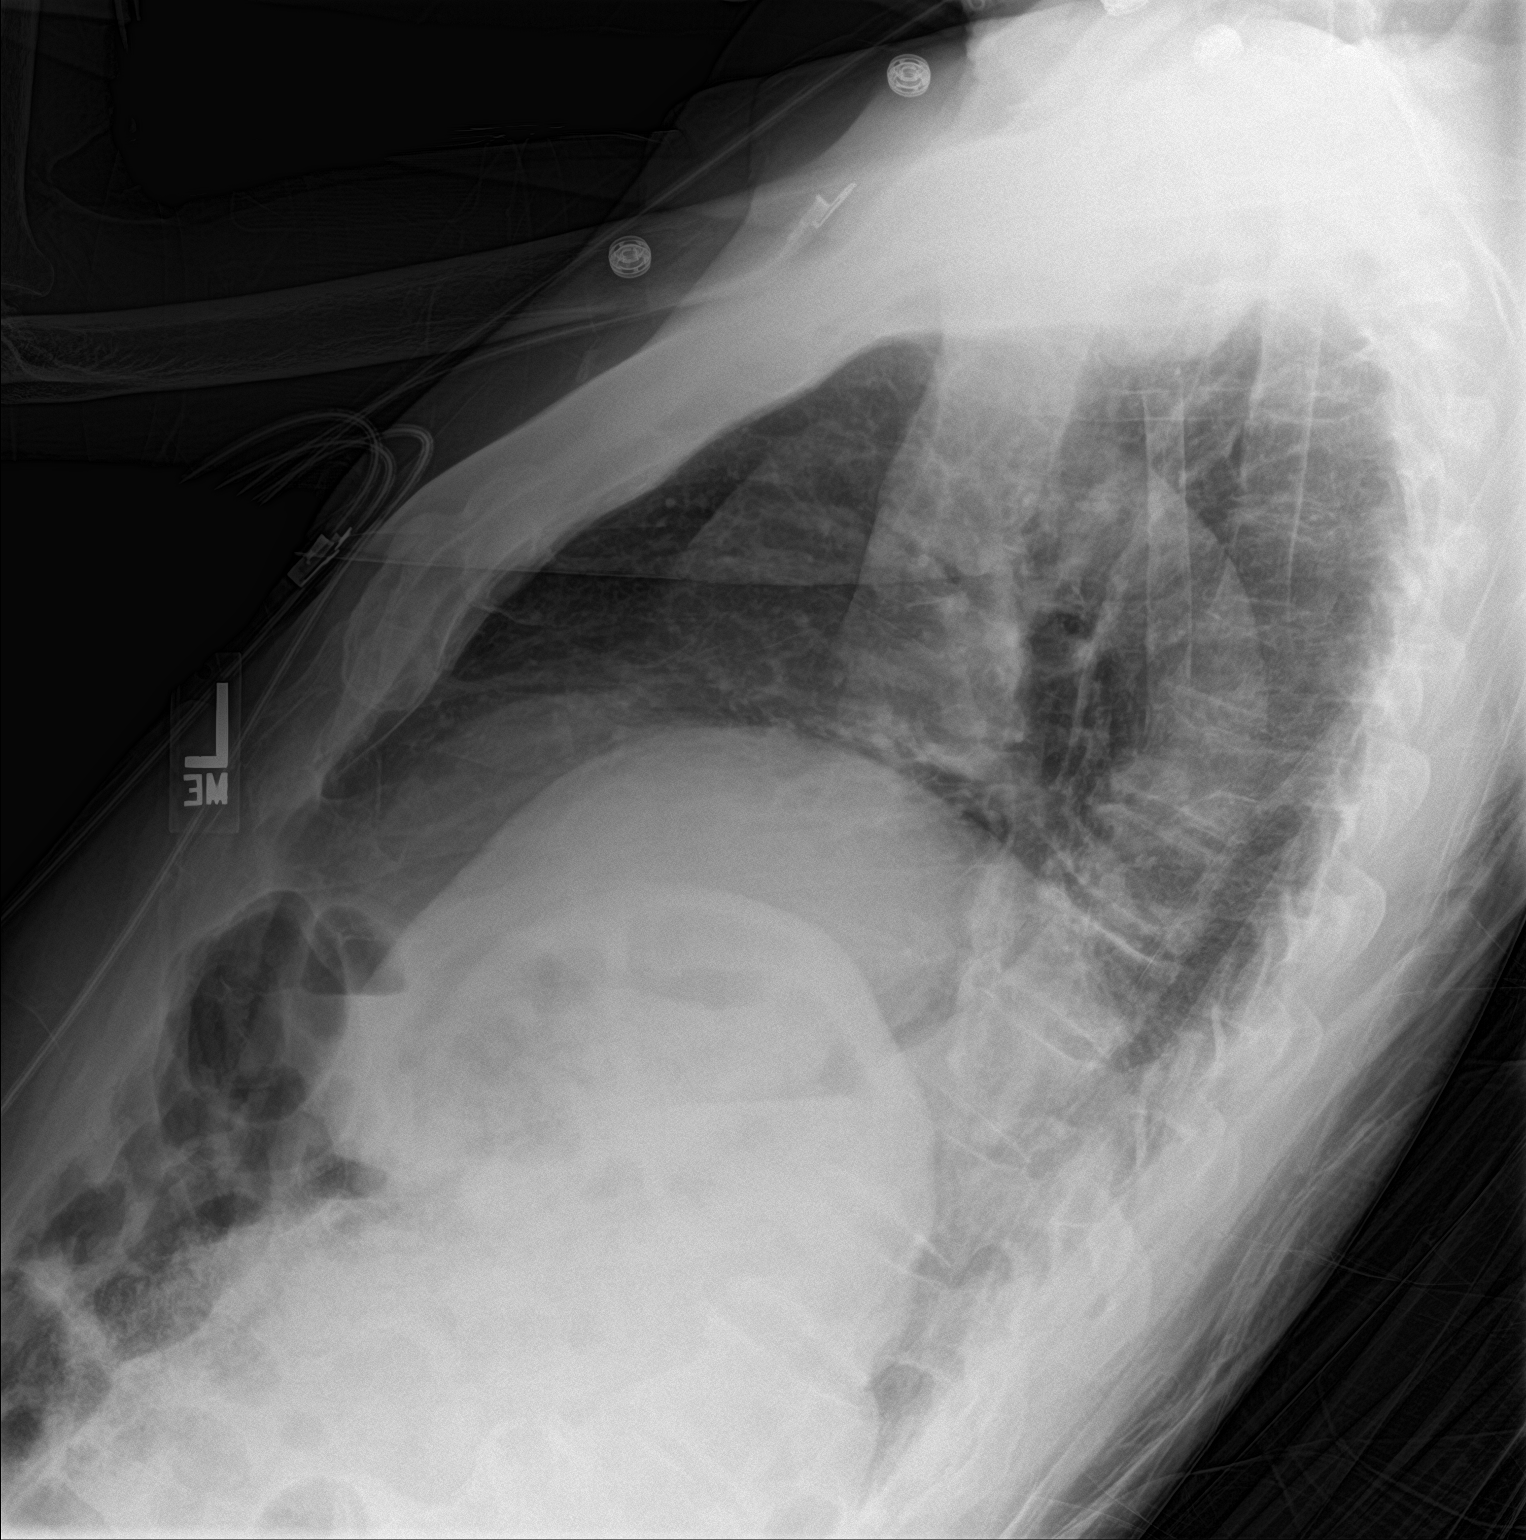

[chest ap]
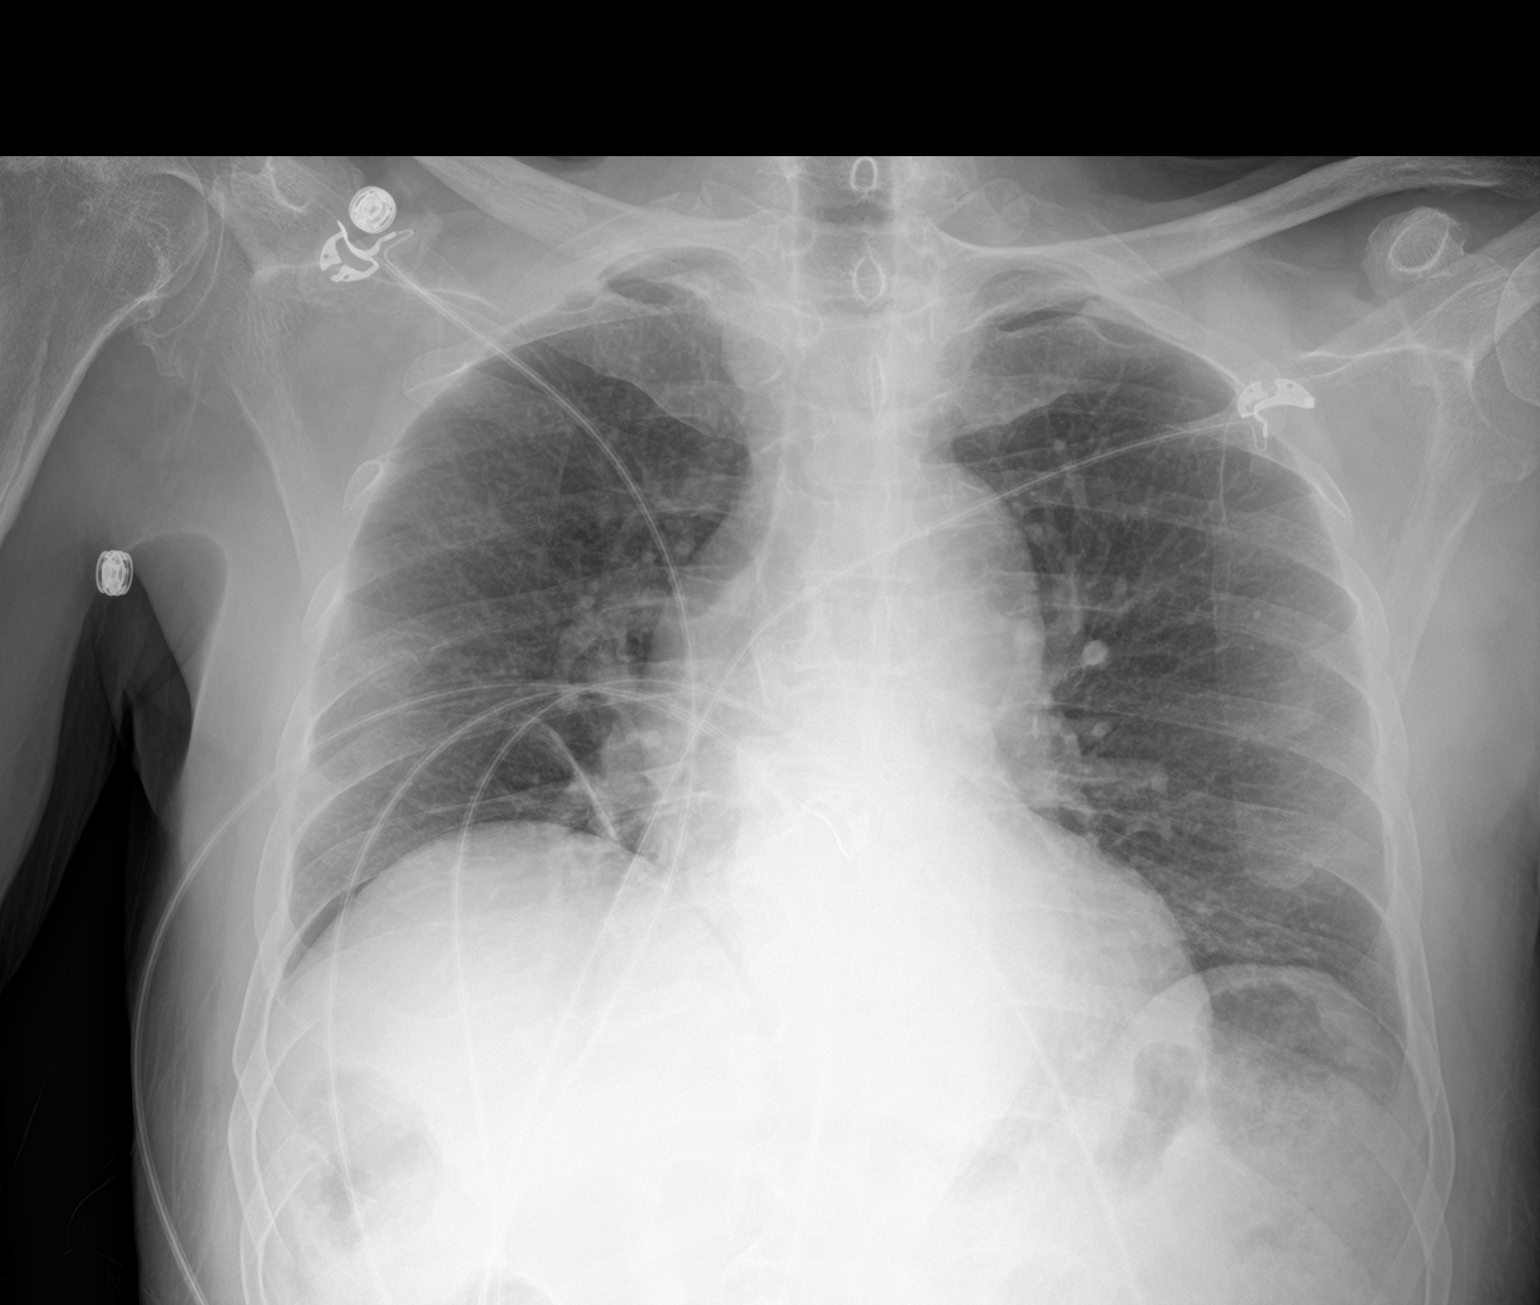

[2 of 2 positions shown; findings below may reference images not displayed]

FINDINGS: Cardiomediastinal silhouette is normal. Mediastinal contours appear
intact.

Low lung volumes. Bilateral lower lobe atelectasis, more prominent
on the left.

Osseous structures are without acute abnormality. Soft tissues are
grossly normal.
IMPRESSION: Low lung volumes with bilateral lower lobe atelectasis, more
prominent on the left.

## 2021-06-16 DIAGNOSIS — Z0000 Encounter for general adult medical examination without abnormal findings: Secondary | ICD-10-CM | POA: Diagnosis not present

## 2021-06-16 DIAGNOSIS — Z139 Encounter for screening, unspecified: Secondary | ICD-10-CM | POA: Diagnosis not present

## 2021-06-16 DIAGNOSIS — Z1331 Encounter for screening for depression: Secondary | ICD-10-CM | POA: Diagnosis not present

## 2021-06-16 DIAGNOSIS — Z9181 History of falling: Secondary | ICD-10-CM | POA: Diagnosis not present

## 2021-06-16 DIAGNOSIS — E785 Hyperlipidemia, unspecified: Secondary | ICD-10-CM | POA: Diagnosis not present

## 2021-06-20 DIAGNOSIS — N401 Enlarged prostate with lower urinary tract symptoms: Secondary | ICD-10-CM | POA: Diagnosis not present

## 2021-06-20 DIAGNOSIS — N529 Male erectile dysfunction, unspecified: Secondary | ICD-10-CM | POA: Diagnosis not present

## 2021-06-20 DIAGNOSIS — Z125 Encounter for screening for malignant neoplasm of prostate: Secondary | ICD-10-CM | POA: Diagnosis not present

## 2021-06-20 DIAGNOSIS — N390 Urinary tract infection, site not specified: Secondary | ICD-10-CM | POA: Diagnosis not present

## 2021-06-21 DIAGNOSIS — H2511 Age-related nuclear cataract, right eye: Secondary | ICD-10-CM | POA: Diagnosis not present

## 2021-06-30 ENCOUNTER — Encounter: Payer: Self-pay | Admitting: Cardiology

## 2021-06-30 ENCOUNTER — Ambulatory Visit: Payer: Medicare HMO | Admitting: Cardiology

## 2021-06-30 ENCOUNTER — Other Ambulatory Visit: Payer: Self-pay

## 2021-06-30 VITALS — BP 122/70 | HR 60 | Ht 71.0 in | Wt 217.2 lb

## 2021-06-30 DIAGNOSIS — E785 Hyperlipidemia, unspecified: Secondary | ICD-10-CM | POA: Diagnosis not present

## 2021-06-30 DIAGNOSIS — I420 Dilated cardiomyopathy: Secondary | ICD-10-CM | POA: Diagnosis not present

## 2021-06-30 DIAGNOSIS — Z86711 Personal history of pulmonary embolism: Secondary | ICD-10-CM | POA: Diagnosis not present

## 2021-06-30 DIAGNOSIS — I10 Essential (primary) hypertension: Secondary | ICD-10-CM

## 2021-06-30 DIAGNOSIS — I5022 Chronic systolic (congestive) heart failure: Secondary | ICD-10-CM | POA: Diagnosis not present

## 2021-06-30 MED ORDER — ATORVASTATIN CALCIUM 10 MG PO TABS: 10.0000 mg | ORAL_TABLET | Freq: Every day | ORAL | 3 refills | Status: DC

## 2021-06-30 NOTE — Addendum Note (Signed)
Addended by: Anselm Pancoast on: 06/30/2021 08:25 AM   Modules accepted: Orders

## 2021-06-30 NOTE — Patient Instructions (Addendum)
Medication Instructions:  Your physician has recommended you make the following change in your medication:  START LIPITOR 10 MG ONCE DAILY  *If you need a refill on your cardiac medications before your next appointment, please call your pharmacy*   Lab Work: Your physician recommends that you return for lab work in: [place], AST & ALT  If you have labs (blood work) drawn today and your tests are completely normal, you will receive your results only by: MyChart Message (if you have MyChart) OR A paper copy in the mail If you have any lab test that is abnormal or we need to change your treatment, we will call you to review the results.   Testing/Procedures: Your physician has requested that you have an echocardiogram. Echocardiography is a painless test that uses sound waves to create images of your heart. It provides your doctor with information about the size and shape of your heart and how well your hearts chambers and valves are working. This procedure takes approximately one hour. There are no restrictions for this procedure.    Follow-Up: At [facility], you and your health needs are our priority.  As part of our continuing mission to provide you with exceptional heart care, we have created designated Provider Care Teams.  These Care Teams include your primary Cardiologist (physician) and Advanced Practice Providers (APPs -  Physician Assistants and Nurse Practitioners) who all work together to provide you with the care you need, when you need it.  We recommend signing up for the patient portal called "MyChart".  Sign up information is provided on this After Visit Summary.  MyChart is used to connect with patients for Virtual Visits (Telemedicine).  Patients are able to view lab/test results, encounter notes, upcoming appointments, etc.  Non-urgent messages can be sent to your provider as well.   To learn more about what you can do with MyChart, go to  [email or web address].    Your next appointment:   6 month(s)  The format for your next appointment:   In Person  Provider:   Jenne Campus, MD    Other Instructions

## 2021-06-30 NOTE — Progress Notes (Signed)
Cardiology Office Note:    Date:  06/30/2021   ID:  ACESON LABELL, DOB [birth year], MRN [id number]  PCP:  Helen Hashimoto., MD  Cardiologist:  Jenne Campus, MD    Referring MD: Helen Hashimoto., MD   Chief Complaint  Patient presents with   Follow-up  I am doing well  History of Present Illness:    Benjamin Woods is a 81 y.o. male with past medical history significant for cardiomyopathy which was discovered initially in December 2020 at that time ejection fraction 25 to 30% later with guideline directed medical therapy his ejection fraction improved and the lightest echocardiogram done in 2021 showed ejection fraction 50 to 55%.  He does have history of some kidney dysfunction, essential hypertension, remote CVA, advanced gout.  He is in my office today to talk about his problem.  Looks like he is doing well overall denies have any chest pain tightness squeezing pressure burning chest no palpitations no dizziness no swelling of lower extremities  Past Medical History:  Diagnosis Date   Acute combined systolic and diastolic congestive heart failure ([place]) 12/10/2018   Acute combined systolic and diastolic heart failure (HCC)    BPH (benign prostatic hyperplasia)    Cardiomyopathy (HCC)    DVT (deep venous thrombosis) ([place]) 12/10/2018   Elevated troponin 12/10/2018   GERD (gastroesophageal reflux disease)    Gout    History of pulmonary embolism 12/22/2019   HTN (hypertension)    Hypocalcemia    Hypokalemia    Pressure injury of skin 12/11/2018   Protein-calorie malnutrition, moderate ([place]) 12/10/2018   Pulmonary embolism (HCC)    Stroke (cerebrum) (HCC)    Syncope    Systolic congestive heart failure ([place]) 01/30/2019    Past Surgical History:  Procedure Laterality Date   HAND SURGERY      Current Medications: Current Meds  Medication Sig   apixaban (ELIQUIS) 5 MG TABS tablet Take 2 tablets twice daily until 12/15/18, then continue with 1 tablet twice daily  starting on 12/16/18. (Patient taking differently: Take 5 mg by mouth 2 (two) times daily. Take 2 tablets twice daily until 12/15/18, then continue with 1 tablet twice daily starting on 12/16/18.)   carvedilol (COREG) 12.5 MG tablet TAKE 1 TABLET BY MOUTH TWICE DAILY WITH A MEAL (Patient taking differently: Take 25 mg by mouth 2 (two) times daily with a meal.)   DEXILANT 60 MG capsule Take 1 capsule by mouth daily as needed (GERD).   ENTRESTO 24-26 MG Take 1 tablet by mouth 2 (two) times daily.   Febuxostat 80 MG TABS Take 80 mg by mouth daily.   furosemide (LASIX) 20 MG tablet Take 1 tablet (20 mg total) by mouth daily.   MITIGARE 0.6 MG CAPS Take 1 tablet by mouth as needed (For gout).   sildenafil (REVATIO) 20 MG tablet Take 20 mg by mouth as needed (ED).   sildenafil (VIAGRA) 100 MG tablet Take 100 mg by mouth daily as needed for erectile dysfunction. One hour prior to sex   tamsulosin (FLOMAX) 0.4 MG CAPS capsule Take 0.4 mg by mouth daily.   timolol (TIMOPTIC) 0.5 % ophthalmic solution Place 1 drop into both eyes daily.      Allergies:   Patient has no known allergies.   Social History   Socioeconomic History   Marital status: Married    Spouse name: Not on file   Number of children: Not on file   Years of education: Not on file  Highest education level: Not on file  Occupational History   Not on file  Tobacco Use   Smoking status: Former   Smokeless tobacco: Never  Vaping Use   Vaping Use: Never used  Substance and Sexual Activity   Alcohol use: Not Currently   Drug use: Never   Sexual activity: Not Currently  Other Topics Concern   Not on file  Social History Narrative   Not on file   Social Determinants of Health   Financial Resource Strain: Not on file  Food Insecurity: Not on file  Transportation Needs: Not on file  Physical Activity: Not on file  Stress: Not on file  Social Connections: Not on file     Family History: The patient's family history is not  on file. ROS:   Please see the history of present illness.    All 14 point review of systems negative except as described per history of present illness  EKGs/Labs/Other Studies Reviewed:      Recent Labs: No results found for requested labs within last 8760 hours.  Recent Lipid Panel No results found for: CHOL, TRIG, HDL, CHOLHDL, VLDL, LDLCALC, LDLDIRECT  Physical Exam:    VS:  BP 122/70 (BP Location: Left Arm, Patient Position: Sitting)    Pulse 60    Ht 5\' 11"  (1.803 m)    Wt 217 lb 3.2 oz (98.5 kg)    SpO2 97%    BMI 30.29 kg/m     Wt Readings from Last 3 Encounters:  06/30/21 217 lb 3.2 oz (98.5 kg)  12/01/20 215 lb 6.4 oz (97.7 kg)  06/23/20 218 lb (98.9 kg)     GEN:  Well nourished, well developed in no acute distress HEENT: Normal NECK: No JVD; No carotid bruits LYMPHATICS: No lymphadenopathy CARDIAC: RRR, no murmurs, no rubs, no gallops RESPIRATORY:  Clear to auscultation without rales, wheezing or rhonchi  ABDOMEN: Soft, non-tender, non-distended MUSCULOSKELETAL:  No edema; No deformity  SKIN: Warm and dry LOWER EXTREMITIES: no swelling NEUROLOGIC:  Alert and oriented x 3 PSYCHIATRIC:  Normal affect   ASSESSMENT:    1. Dilated cardiomyopathy ([place])   2. Chronic systolic congestive heart failure ([place])   3. Primary hypertension   4. History of pulmonary embolism   5. Dyslipidemia    PLAN:    In order of problems listed above:  Dilated cardiomyopathy I will ask him to have repeated echocardiogram done to recheck on his ejection fraction.  In the meantime we will continue present medications. Chronic systolic congestive heart failure however latest ejection fraction normalized.  Overall he is hemodynamically stable continue present management. Dyslipidemia I did review his K PN which show me his LDL of 106 HDL 39 this is from November of last year, calculated 10 years risk was more than 23% which is high.  I suggest to start Lipitor he agree will do start  Lipitor.  Fasting lipid profile will be rechecked 6 weeks. Essential hypertension apparently there was some problem with his blood pressure control he is beta-blocker has been increased and doing well since that time. Gout, in 2 months, noted diffuse deformities of joints noted but he is stable now   Medication Adjustments/Labs and Tests Ordered: Current medicines are reviewed at length with the patient today.  Concerns regarding medicines are outlined above.  No orders of the defined types were placed in this encounter.  Medication changes: No orders of the defined types were placed in this encounter.   Signed, Park Liter,  MD, [facility] 06/30/2021 8:10 AM    [place]

## 2021-07-05 ENCOUNTER — Ambulatory Visit (INDEPENDENT_AMBULATORY_CARE_PROVIDER_SITE_OTHER): Payer: Medicare HMO

## 2021-07-05 ENCOUNTER — Other Ambulatory Visit: Payer: Self-pay

## 2021-07-05 DIAGNOSIS — I420 Dilated cardiomyopathy: Secondary | ICD-10-CM | POA: Diagnosis not present

## 2021-07-05 DIAGNOSIS — I5022 Chronic systolic (congestive) heart failure: Secondary | ICD-10-CM | POA: Diagnosis not present

## 2021-07-05 LAB — ECHOCARDIOGRAM COMPLETE
Area-P 1/2: 3.42 cm2
S' Lateral: 3.2 cm

## 2021-07-06 ENCOUNTER — Telehealth: Payer: Self-pay

## 2021-07-06 DIAGNOSIS — I10 Essential (primary) hypertension: Secondary | ICD-10-CM

## 2021-07-06 NOTE — Telephone Encounter (Signed)
-----   Message from Park Liter, MD sent at 07/06/2021 10:58 AM EST ----- ?Echocardiogram showed ejection fraction 4045% which is about 5 to 10% less than before.  Please check Chem-7 with anticipation need to increase Entresto. ?

## 2021-07-06 NOTE — Telephone Encounter (Signed)
Patient notified of results.

## 2021-07-10 DIAGNOSIS — I10 Essential (primary) hypertension: Secondary | ICD-10-CM | POA: Diagnosis not present

## 2021-07-11 LAB — BASIC METABOLIC PANEL
BUN/Creatinine Ratio: 17 (ref 10–24)
BUN: 23 mg/dL (ref 8–27)
CO2: 27 mmol/L (ref 20–29)
Calcium: 9.1 mg/dL (ref 8.6–10.2)
Chloride: 105 mmol/L (ref 96–106)
Creatinine, Ser: 1.36 mg/dL — ABNORMAL HIGH (ref 0.76–1.27)
Glucose: 106 mg/dL — ABNORMAL HIGH (ref 70–99)
Potassium: 4.3 mmol/L (ref 3.5–5.2)
Sodium: 141 mmol/L (ref 134–144)
eGFR: 52 mL/min/{1.73_m2} — ABNORMAL LOW (ref >59)

## 2021-07-26 DIAGNOSIS — E663 Overweight: Secondary | ICD-10-CM | POA: Diagnosis not present

## 2021-07-26 DIAGNOSIS — N529 Male erectile dysfunction, unspecified: Secondary | ICD-10-CM | POA: Diagnosis not present

## 2021-07-26 DIAGNOSIS — Z79899 Other long term (current) drug therapy: Secondary | ICD-10-CM | POA: Diagnosis not present

## 2021-07-26 DIAGNOSIS — N184 Chronic kidney disease, stage 4 (severe): Secondary | ICD-10-CM | POA: Diagnosis not present

## 2021-07-26 DIAGNOSIS — K219 Gastro-esophageal reflux disease without esophagitis: Secondary | ICD-10-CM | POA: Diagnosis not present

## 2021-07-26 DIAGNOSIS — D696 Thrombocytopenia, unspecified: Secondary | ICD-10-CM | POA: Diagnosis not present

## 2021-07-26 DIAGNOSIS — E785 Hyperlipidemia, unspecified: Secondary | ICD-10-CM | POA: Diagnosis not present

## 2021-07-26 DIAGNOSIS — I739 Peripheral vascular disease, unspecified: Secondary | ICD-10-CM | POA: Diagnosis not present

## 2021-07-26 DIAGNOSIS — I10 Essential (primary) hypertension: Secondary | ICD-10-CM | POA: Diagnosis not present

## 2021-07-26 DIAGNOSIS — I5020 Unspecified systolic (congestive) heart failure: Secondary | ICD-10-CM | POA: Diagnosis not present

## 2021-08-24 DIAGNOSIS — R748 Abnormal levels of other serum enzymes: Secondary | ICD-10-CM | POA: Diagnosis not present

## 2021-09-28 DIAGNOSIS — N529 Male erectile dysfunction, unspecified: Secondary | ICD-10-CM | POA: Diagnosis not present

## 2021-09-28 DIAGNOSIS — N390 Urinary tract infection, site not specified: Secondary | ICD-10-CM | POA: Diagnosis not present

## 2021-09-28 DIAGNOSIS — N401 Enlarged prostate with lower urinary tract symptoms: Secondary | ICD-10-CM | POA: Diagnosis not present

## 2021-11-28 DIAGNOSIS — N529 Male erectile dysfunction, unspecified: Secondary | ICD-10-CM | POA: Diagnosis not present

## 2021-11-28 DIAGNOSIS — Z79899 Other long term (current) drug therapy: Secondary | ICD-10-CM | POA: Diagnosis not present

## 2021-11-28 DIAGNOSIS — I10 Essential (primary) hypertension: Secondary | ICD-10-CM | POA: Diagnosis not present

## 2021-11-28 DIAGNOSIS — E785 Hyperlipidemia, unspecified: Secondary | ICD-10-CM | POA: Diagnosis not present

## 2021-11-28 DIAGNOSIS — E663 Overweight: Secondary | ICD-10-CM | POA: Diagnosis not present

## 2021-11-28 DIAGNOSIS — Z6828 Body mass index (BMI) 28.0-28.9, adult: Secondary | ICD-10-CM | POA: Diagnosis not present

## 2021-11-28 DIAGNOSIS — K219 Gastro-esophageal reflux disease without esophagitis: Secondary | ICD-10-CM | POA: Diagnosis not present

## 2021-11-28 DIAGNOSIS — M109 Gout, unspecified: Secondary | ICD-10-CM | POA: Diagnosis not present

## 2021-12-29 ENCOUNTER — Other Ambulatory Visit: Payer: Self-pay

## 2021-12-29 ENCOUNTER — Encounter: Payer: Self-pay | Admitting: Cardiology

## 2021-12-29 ENCOUNTER — Ambulatory Visit: Payer: Medicare HMO | Admitting: Cardiology

## 2021-12-29 ENCOUNTER — Other Ambulatory Visit (HOSPITAL_COMMUNITY): Payer: Self-pay

## 2021-12-29 VITALS — BP 126/68 | HR 61 | Ht 72.0 in | Wt 212.4 lb

## 2021-12-29 DIAGNOSIS — Z86711 Personal history of pulmonary embolism: Secondary | ICD-10-CM | POA: Diagnosis not present

## 2021-12-29 DIAGNOSIS — I420 Dilated cardiomyopathy: Secondary | ICD-10-CM | POA: Diagnosis not present

## 2021-12-29 DIAGNOSIS — E785 Hyperlipidemia, unspecified: Secondary | ICD-10-CM | POA: Diagnosis not present

## 2021-12-29 DIAGNOSIS — I10 Essential (primary) hypertension: Secondary | ICD-10-CM | POA: Diagnosis not present

## 2021-12-29 MED ORDER — ATORVASTATIN CALCIUM 20 MG PO TABS: 20.0000 mg | ORAL_TABLET | Freq: Every day | ORAL | 3 refills | Status: DC

## 2021-12-29 NOTE — Progress Notes (Unsigned)
Cardiology Office Note:    Date:  12/29/2021   ID:  Benjamin Woods, DOB [birth year], MRN [id number]  PCP:  Helen Hashimoto., MD  Cardiologist:  Jenne Campus, MD    Referring MD: Helen Hashimoto., MD   Chief Complaint  Patient presents with   Follow-up    History of Present Illness:    Benjamin Woods is a 81 y.o. male with past medical history significant for cardiomyopathy initially discovered in August 2020 with ejection fraction 25 to 30%, after that he was put on guideline directed medical therapy and if ejection fraction improved to 40 to 45% and or close to normal he will also have history of pulmonary emboli as well as DVT.  Additional history include essential hypertension dyslipidemia history of CVA GERD and advanced gout. He is in my office today overall seems to be doing well.  Denies having any exertional shortness of breath no swelling of lower extremities no proximal nocturnal dyspnea.  No palpitations dizziness  Past Medical History:  Diagnosis Date   Acute combined systolic and diastolic congestive heart failure ([place]) 12/10/2018   Acute combined systolic and diastolic heart failure (HCC)    BPH (benign prostatic hyperplasia)    Cardiomyopathy (HCC)    DVT (deep venous thrombosis) ([place]) 12/10/2018   Elevated troponin 12/10/2018   GERD (gastroesophageal reflux disease)    Gout    History of pulmonary embolism 12/22/2019   HTN (hypertension)    Hypocalcemia    Hypokalemia    Pressure injury of skin 12/11/2018   Protein-calorie malnutrition, moderate ([place]) 12/10/2018   Pulmonary embolism (HCC)    Stroke (cerebrum) (HCC)    Syncope    Systolic congestive heart failure ([place]) 01/30/2019    Past Surgical History:  Procedure Laterality Date   HAND SURGERY      Current Medications: Current Meds  Medication Sig   apixaban (ELIQUIS) 5 MG TABS tablet Take 2 tablets twice daily until 12/15/18, then continue with 1 tablet twice daily starting on 12/16/18.  (Patient taking differently: Take 5 mg by mouth 2 (two) times daily. Take 2 tablets twice daily until 12/15/18, then continue with 1 tablet twice daily starting on 12/16/18.)   atorvastatin (LIPITOR) 10 MG tablet Take 1 tablet (10 mg total) by mouth daily.   carvedilol (COREG) 12.5 MG tablet TAKE 1 TABLET BY MOUTH TWICE DAILY WITH A MEAL (Patient taking differently: Take 25 mg by mouth 2 (two) times daily with a meal.)   cetirizine (ZYRTEC) 10 MG tablet Take 10 mg by mouth daily.   DEXILANT 60 MG capsule Take 1 capsule by mouth daily as needed (GERD).   ENTRESTO 24-26 MG Take 1 tablet by mouth 2 (two) times daily.   Febuxostat 80 MG TABS Take 80 mg by mouth daily.   furosemide (LASIX) 20 MG tablet Take 1 tablet (20 mg total) by mouth daily.   LATANOPROST OP Apply 125 mg to eye daily.   losartan (COZAAR) 100 MG tablet Take 100 mg by mouth daily.   MITIGARE 0.6 MG CAPS Take 1 tablet by mouth as needed (For gout).   sildenafil (VIAGRA) 100 MG tablet Take 100 mg by mouth daily as needed for erectile dysfunction. One hour prior to sex   tamsulosin (FLOMAX) 0.4 MG CAPS capsule Take 0.4 mg by mouth daily.     Allergies:   Patient has no known allergies.   Social History   Socioeconomic History   Marital status: Married    Spouse name:  Not on file   Number of children: Not on file   Years of education: Not on file   Highest education level: Not on file  Occupational History   Not on file  Tobacco Use   Smoking status: Former   Smokeless tobacco: Never  Vaping Use   Vaping Use: Never used  Substance and Sexual Activity   Alcohol use: Not Currently   Drug use: Never   Sexual activity: Not Currently  Other Topics Concern   Not on file  Social History Narrative   Not on file   Social Determinants of Health   Financial Resource Strain: Not on file  Food Insecurity: Not on file  Transportation Needs: Not on file  Physical Activity: Not on file  Stress: Not on file  Social  Connections: Not on file     Family History: The patient's family history is not on file. ROS:   Please see the history of present illness.    All 14 point review of systems negative except as described per history of present illness  EKGs/Labs/Other Studies Reviewed:      Recent Labs: 07/10/2021: BUN 23; Creatinine, Ser 1.36; Potassium 4.3; Sodium 141  Recent Lipid Panel No results found for: "CHOL", "TRIG", "HDL", "CHOLHDL", "VLDL", "LDLCALC", "LDLDIRECT"  Physical Exam:    VS:  BP 126/68 (BP Location: Left Arm, Patient Position: Sitting)   Pulse 61   Ht 6' (1.829 m)   Wt 212 lb 6.4 oz (96.3 kg)   SpO2 98%   BMI 28.81 kg/m     Wt Readings from Last 3 Encounters:  12/29/21 212 lb 6.4 oz (96.3 kg)  06/30/21 217 lb 3.2 oz (98.5 kg)  12/01/20 215 lb 6.4 oz (97.7 kg)     GEN:  Well nourished, well developed in no acute distress HEENT: Normal NECK: No JVD; No carotid bruits LYMPHATICS: No lymphadenopathy CARDIAC: RRR, no murmurs, no rubs, no gallops RESPIRATORY:  Clear to auscultation without rales, wheezing or rhonchi  ABDOMEN: Soft, non-tender, non-distended MUSCULOSKELETAL:  No edema; No deformity  SKIN: Warm and dry LOWER EXTREMITIES: no swelling NEUROLOGIC:  Alert and oriented x 3 PSYCHIATRIC:  Normal affect   ASSESSMENT:    1. Dilated cardiomyopathy ([place])   2. Primary hypertension   3. Dyslipidemia   4. History of pulmonary embolism    PLAN:    In order of problems listed above:  Dilated cardiomyopathy ejection fraction lately decreased again to 4045%.  I will ask him to have Chem-7 done to see if he can increase dose of Entresto.  If not we will probably initiate isosorbide dinitrate, hemodynamically appears to be compensated.  In terms of etiology of his cardiomyopathy is unclear at this moment.  He will require ischemia work-up and planning to talk to him about this when I see him next time. Essential hypertension: Blood pressure well controlled continue  present management. Dyslipidemia he is on Lipitor 10 and I will continue present management however I will increase the dose of Lipitor since his last HDL was 38 and LDL at 96 this is from 11/28/2021. History of DVT and PE continue with Eliquis   Medication Adjustments/Labs and Tests Ordered: Current medicines are reviewed at length with the patient today.  Concerns regarding medicines are outlined above.  No orders of the defined types were placed in this encounter.  Medication changes: No orders of the defined types were placed in this encounter.   Signed, Park Liter, MD, [facility] 12/29/2021 1:14 PM  [facility]

## 2021-12-29 NOTE — Patient Instructions (Signed)
Medication Instructions:  Your physician has recommended you make the following change in your medication:   START: Lipitor 20 mg daily  *If you need a refill on your cardiac medications before your next appointment, please call your pharmacy*   Lab Work: Your physician recommends that you return for lab work in:   Labs today: [place] in 6 weeks: Lipid, LFT  If you have labs (blood work) drawn today and your tests are completely normal, you will receive your results only by: [place] (if you have [place]) OR A paper copy in the mail If you have any lab test that is abnormal or we need to change your treatment, we will call you to review the results.   Testing/Procedures: None   Follow-Up: At [facility], you and your health needs are our priority.  As part of our continuing mission to provide you with exceptional heart care, we have created designated Provider Care Teams.  These Care Teams include your primary Cardiologist (physician) and Advanced Practice Providers (APPs -  Physician Assistants and Nurse Practitioners) who all work together to provide you with the care you need, when you need it.  We recommend signing up for the patient portal called "MyChart".  Sign up information is provided on this After Visit Summary.  MyChart is used to connect with patients for Virtual Visits (Telemedicine).  Patients are able to view lab/test results, encounter notes, upcoming appointments, etc.  Non-urgent messages can be sent to your provider as well.   To learn more about what you can do with MyChart, go to [email or web address].    Your next appointment:   6 month(s)  The format for your next appointment:   In Person  Provider:   Jenne Campus, MD    Other Instructions None  Important Information About Sugar

## 2021-12-30 LAB — BASIC METABOLIC PANEL
BUN/Creatinine Ratio: 15 (ref 10–24)
BUN: 21 mg/dL (ref 8–27)
CO2: 19 mmol/L — ABNORMAL LOW (ref 20–29)
Calcium: 8.8 mg/dL (ref 8.6–10.2)
Chloride: 102 mmol/L (ref 96–106)
Creatinine, Ser: 1.44 mg/dL — ABNORMAL HIGH (ref 0.76–1.27)
Glucose: 109 mg/dL — ABNORMAL HIGH (ref 70–99)
Potassium: 4.1 mmol/L (ref 3.5–5.2)
Sodium: 136 mmol/L (ref 134–144)
eGFR: 49 mL/min/{1.73_m2} — ABNORMAL LOW (ref >59)

## 2022-01-04 ENCOUNTER — Telehealth: Payer: Self-pay

## 2022-01-04 NOTE — Telephone Encounter (Signed)
Patient notified of results and recommendations.

## 2022-01-04 NOTE — Telephone Encounter (Signed)
-----   Message from Park Liter, MD sent at 01/04/2022 11:27 AM EDT ----- Kidney function is still mildly diminished.  Please keep present dose of Entresto

## 2022-02-01 ENCOUNTER — Telehealth: Payer: Self-pay | Admitting: Cardiology

## 2022-02-01 DIAGNOSIS — N401 Enlarged prostate with lower urinary tract symptoms: Secondary | ICD-10-CM | POA: Diagnosis not present

## 2022-02-01 DIAGNOSIS — N529 Male erectile dysfunction, unspecified: Secondary | ICD-10-CM | POA: Diagnosis not present

## 2022-02-01 DIAGNOSIS — N390 Urinary tract infection, site not specified: Secondary | ICD-10-CM | POA: Diagnosis not present

## 2022-02-01 NOTE — Telephone Encounter (Signed)
Pt c/o medication issue:  1. Name of Medication: ENTRESTO 24-26 MG  2. How are you currently taking this medication (dosage and times per day)? Take 1 tablet by mouth 2 (two) times daily.  3. Are you having a reaction (difficulty breathing--STAT)?   4. What is your medication issue? Pt states he started taking this medication and it is causing him to cough a lot.

## 2022-02-05 NOTE — Telephone Encounter (Signed)
Spoke with pt. Advised per Dr. Wendy Poet note to hold Entresto to see if his cough will stop. Advised pt to call back to let us know if the cough gets better. Pt agreed and verbalized understanding.

## 2022-03-06 DIAGNOSIS — N529 Male erectile dysfunction, unspecified: Secondary | ICD-10-CM | POA: Diagnosis not present

## 2022-03-06 DIAGNOSIS — N401 Enlarged prostate with lower urinary tract symptoms: Secondary | ICD-10-CM | POA: Diagnosis not present

## 2022-03-27 DIAGNOSIS — I10 Essential (primary) hypertension: Secondary | ICD-10-CM | POA: Diagnosis not present

## 2022-03-27 DIAGNOSIS — Z79899 Other long term (current) drug therapy: Secondary | ICD-10-CM | POA: Diagnosis not present

## 2022-03-27 DIAGNOSIS — Z6829 Body mass index (BMI) 29.0-29.9, adult: Secondary | ICD-10-CM | POA: Diagnosis not present

## 2022-03-27 DIAGNOSIS — K219 Gastro-esophageal reflux disease without esophagitis: Secondary | ICD-10-CM | POA: Diagnosis not present

## 2022-03-27 DIAGNOSIS — E785 Hyperlipidemia, unspecified: Secondary | ICD-10-CM | POA: Diagnosis not present

## 2022-06-06 DIAGNOSIS — R339 Retention of urine, unspecified: Secondary | ICD-10-CM | POA: Diagnosis not present

## 2022-06-06 DIAGNOSIS — N401 Enlarged prostate with lower urinary tract symptoms: Secondary | ICD-10-CM | POA: Diagnosis not present

## 2022-06-06 DIAGNOSIS — N529 Male erectile dysfunction, unspecified: Secondary | ICD-10-CM | POA: Diagnosis not present

## 2022-06-06 DIAGNOSIS — N184 Chronic kidney disease, stage 4 (severe): Secondary | ICD-10-CM | POA: Diagnosis not present

## 2022-06-07 DIAGNOSIS — Z6829 Body mass index (BMI) 29.0-29.9, adult: Secondary | ICD-10-CM | POA: Diagnosis not present

## 2022-06-07 DIAGNOSIS — I10 Essential (primary) hypertension: Secondary | ICD-10-CM | POA: Diagnosis not present

## 2022-06-12 DIAGNOSIS — M47816 Spondylosis without myelopathy or radiculopathy, lumbar region: Secondary | ICD-10-CM | POA: Diagnosis not present

## 2022-06-12 DIAGNOSIS — R339 Retention of urine, unspecified: Secondary | ICD-10-CM | POA: Diagnosis not present

## 2022-06-12 DIAGNOSIS — N401 Enlarged prostate with lower urinary tract symptoms: Secondary | ICD-10-CM | POA: Diagnosis not present

## 2022-06-12 DIAGNOSIS — N184 Chronic kidney disease, stage 4 (severe): Secondary | ICD-10-CM | POA: Diagnosis not present

## 2022-06-12 DIAGNOSIS — N189 Chronic kidney disease, unspecified: Secondary | ICD-10-CM | POA: Diagnosis not present

## 2022-06-12 DIAGNOSIS — I878 Other specified disorders of veins: Secondary | ICD-10-CM | POA: Diagnosis not present

## 2022-07-09 ENCOUNTER — Encounter: Payer: Self-pay | Admitting: Cardiology

## 2022-07-09 ENCOUNTER — Ambulatory Visit: Payer: Medicare HMO | Attending: Cardiology | Admitting: Cardiology

## 2022-07-09 VITALS — BP 110/70 | HR 52 | Ht 72.0 in | Wt 225.0 lb

## 2022-07-09 DIAGNOSIS — I5041 Acute combined systolic (congestive) and diastolic (congestive) heart failure: Secondary | ICD-10-CM

## 2022-07-09 DIAGNOSIS — R0609 Other forms of dyspnea: Secondary | ICD-10-CM | POA: Diagnosis not present

## 2022-07-09 DIAGNOSIS — Z86711 Personal history of pulmonary embolism: Secondary | ICD-10-CM

## 2022-07-09 DIAGNOSIS — E785 Hyperlipidemia, unspecified: Secondary | ICD-10-CM

## 2022-07-09 DIAGNOSIS — I10 Essential (primary) hypertension: Secondary | ICD-10-CM

## 2022-07-09 NOTE — Addendum Note (Signed)
Addended by: Jacobo Forest D on: 07/09/2022 02:29 PM   Modules accepted: Orders

## 2022-07-09 NOTE — Progress Notes (Signed)
Cardiology Office Note:    Date:  07/09/2022   ID:  RUFORD IMBURGIA, DOB 01/21/1941, MRN [id number]  PCP:  Helen Hashimoto., MD  Cardiologist:  Jenne Campus, MD    Referring MD: Helen Hashimoto., MD   No chief complaint on file. Doing fine  History of Present Illness:    Benjamin Woods is a 82 y.o. male  with past medical history significant for cardiomyopathy initially discovered in August 2020 with ejection fraction 25 to 30%, after that he was put on guideline directed medical therapy and if ejection fraction improved to 40 to 45% and or close to normal he will also have history of pulmonary emboli as well as DVT. Additional history include essential hypertension dyslipidemia history of CVA GERD and advanced gout.  Comes today to months for follow-up.  Overall seems to be doing well.  He denies have any chest pain tightness squeezing pressure mid chest he does complain however of having some shortness of breath with exertion.   Past Medical History:  Diagnosis Date   Acute combined systolic and diastolic congestive heart failure ([place]) 12/10/2018   Acute combined systolic and diastolic heart failure (HCC)    BPH (benign prostatic hyperplasia)    Cardiomyopathy (HCC)    DVT (deep venous thrombosis) ([place]) 12/10/2018   Elevated troponin 12/10/2018   GERD (gastroesophageal reflux disease)    Gout    History of pulmonary embolism 12/22/2019   HTN (hypertension)    Hypocalcemia    Hypokalemia    Pressure injury of skin 12/11/2018   Protein-calorie malnutrition, moderate ([place]) 12/10/2018   Pulmonary embolism (HCC)    Stroke (cerebrum) (HCC)    Syncope    Systolic congestive heart failure ([place]) 01/30/2019    Past Surgical History:  Procedure Laterality Date   HAND SURGERY      Current Medications: Current Meds  Medication Sig   amLODipine (NORVASC) 10 MG tablet Take 10 mg by mouth daily.   apixaban (ELIQUIS) 5 MG TABS tablet Take 5 mg by mouth 2 (two) times daily.    atorvastatin (LIPITOR) 20 MG tablet Take 1 tablet (20 mg total) by mouth daily.   carvedilol (COREG) 25 MG tablet Take 25 mg by mouth 2 (two) times daily with a meal.   cetirizine (ZYRTEC) 10 MG tablet Take 10 mg by mouth daily.   DEXILANT 60 MG capsule Take 1 capsule by mouth daily as needed (GERD).   ENTRESTO 24-26 MG Take 1 tablet by mouth 2 (two) times daily.   Febuxostat 80 MG TABS Take 80 mg by mouth daily.   furosemide (LASIX) 20 MG tablet Take 1 tablet (20 mg total) by mouth daily.   guaiFENesin (MUCINEX) 600 MG 12 hr tablet Take by mouth 2 (two) times daily.   latanoprost (XALATAN) 0.005 % ophthalmic solution Place 1 drop into both eyes at bedtime.   potassium chloride (KLOR-CON) 10 MEQ tablet Take 10 mEq by mouth daily.   sildenafil (VIAGRA) 100 MG tablet Take 100 mg by mouth daily as needed for erectile dysfunction. One hour prior to sex   tamsulosin (FLOMAX) 0.4 MG CAPS capsule Take 0.4 mg by mouth daily.   [DISCONTINUED] colchicine 0.6 MG tablet Take 0.6 mg by mouth as needed (gout).   [DISCONTINUED] LATANOPROST OP Apply 125 mg to eye daily.     Allergies:   Patient has no known allergies.   Social History   Socioeconomic History   Marital status: Married    Spouse name: Not  on file   Number of children: Not on file   Years of education: Not on file   Highest education level: Not on file  Occupational History   Not on file  Tobacco Use   Smoking status: Former   Smokeless tobacco: Never  Vaping Use   Vaping Use: Never used  Substance and Sexual Activity   Alcohol use: Not Currently   Drug use: Never   Sexual activity: Not Currently  Other Topics Concern   Not on file  Social History Narrative   Not on file   Social Determinants of Health   Financial Resource Strain: Not on file  Food Insecurity: Not on file  Transportation Needs: Not on file  Physical Activity: Not on file  Stress: Not on file  Social Connections: Not on file     Family History: The  patient's family history is not on file. ROS:   Please see the history of present illness.    All 14 point review of systems negative except as described per history of present illness  EKGs/Labs/Other Studies Reviewed:      Recent Labs: 12/29/2021: BUN 21; Creatinine, Ser 1.44; Potassium 4.1; Sodium 136  Recent Lipid Panel No results found for: "CHOL", "TRIG", "HDL", "CHOLHDL", "VLDL", "LDLCALC", "LDLDIRECT"  Physical Exam:    VS:  BP 110/70 (BP Location: Right Arm, Patient Position: Sitting, Cuff Size: Normal)   Pulse (!) 52   Ht 6' (1.829 m)   Wt 225 lb (102.1 kg)   SpO2 95%   BMI 30.52 kg/m     Wt Readings from Last 3 Encounters:  07/09/22 225 lb (102.1 kg)  12/29/21 212 lb 6.4 oz (96.3 kg)  06/30/21 217 lb 3.2 oz (98.5 kg)     GEN:  Well nourished, well developed in no acute distress HEENT: Normal NECK: No JVD; No carotid bruits LYMPHATICS: No lymphadenopathy CARDIAC: RRR, no murmurs, no rubs, no gallops RESPIRATORY:  Clear to auscultation without rales, wheezing or rhonchi  ABDOMEN: Soft, non-tender, non-distended MUSCULOSKELETAL:  No edema; No deformity  SKIN: Warm and dry LOWER EXTREMITIES: no swelling NEUROLOGIC:  Alert and oriented x 3 PSYCHIATRIC:  Normal affect   ASSESSMENT:    1. Acute combined systolic and diastolic heart failure ([place])   2. Primary hypertension   3. History of pulmonary embolism   4. Dyslipidemia    PLAN:    In order of problems listed above:  Congestive heart failure.  Seems to be compensated on physical exam.  I will schedule him to have echocardiogram done to recheck left ventricle ejection fraction. Essential hypertension blood pressure well-controlled continue present management. Dyslipidemia I did review K PN which show me his LDL 73 HDL 44.  Will continue present management. History of pulmonary emboli.  He is taking Eliquis 5 mg twice daily which I will continue   Medication Adjustments/Labs and Tests Ordered: Current  medicines are reviewed at length with the patient today.  Concerns regarding medicines are outlined above.  No orders of the defined types were placed in this encounter.  Medication changes: No orders of the defined types were placed in this encounter.   Signed, Park Liter, MD, [facility] 07/09/2022 2:23 PM    [place]

## 2022-07-09 NOTE — Patient Instructions (Signed)

## 2022-07-18 ENCOUNTER — Ambulatory Visit: Payer: Medicare HMO | Attending: Cardiology

## 2022-07-18 DIAGNOSIS — R0609 Other forms of dyspnea: Secondary | ICD-10-CM

## 2022-07-18 LAB — ECHOCARDIOGRAM COMPLETE
Calc EF: 49.1 %
S' Lateral: 3.9 cm
Single Plane A2C EF: 46.7 %
Single Plane A4C EF: 50.9 %

## 2022-07-24 ENCOUNTER — Telehealth: Payer: Self-pay

## 2022-07-24 DIAGNOSIS — D696 Thrombocytopenia, unspecified: Secondary | ICD-10-CM | POA: Diagnosis not present

## 2022-07-24 DIAGNOSIS — I739 Peripheral vascular disease, unspecified: Secondary | ICD-10-CM | POA: Diagnosis not present

## 2022-07-24 DIAGNOSIS — I10 Essential (primary) hypertension: Secondary | ICD-10-CM | POA: Diagnosis not present

## 2022-07-24 DIAGNOSIS — Z6829 Body mass index (BMI) 29.0-29.9, adult: Secondary | ICD-10-CM | POA: Diagnosis not present

## 2022-07-24 DIAGNOSIS — N184 Chronic kidney disease, stage 4 (severe): Secondary | ICD-10-CM | POA: Diagnosis not present

## 2022-07-24 DIAGNOSIS — Z9181 History of falling: Secondary | ICD-10-CM | POA: Diagnosis not present

## 2022-07-24 NOTE — Telephone Encounter (Signed)
Patient notified of results.

## 2022-07-24 NOTE — Telephone Encounter (Signed)
-----   Message from Park Liter, MD sent at 07/19/2022  9:11 PM EDT ----- Ejection fraction improved 45 to 50%, continue present management

## 2022-08-02 DIAGNOSIS — D709 Neutropenia, unspecified: Secondary | ICD-10-CM | POA: Diagnosis not present

## 2022-08-16 ENCOUNTER — Telehealth: Payer: Self-pay

## 2022-08-16 NOTE — Patient Outreach (Signed)
  Care Coordination   08/16/2022 Name: Benjamin [name] MRN: [id number] DOB: [birth year]   Care Coordination Outreach Attempts:  [name] unsuccessful telephone outreach was attempted today to offer the patient information about available care coordination services as a benefit of their health plan.   Follow Up Plan:  Additional outreach attempts will be made to offer the patient care coordination information and services.   Encounter Outcome:  No Answer   Care Coordination Interventions:  No, not indicated    Rowe Pavy, RN, BSN, [facility] [phone]

## 2022-08-23 ENCOUNTER — Telehealth: Payer: Self-pay

## 2022-08-23 NOTE — Patient Outreach (Signed)
  Care Coordination   Initial Visit Note   08/23/2022 Name: Benjamin Woods MRN: [id number] DOB: 1940/10/20  Benjamin Woods is a 82 y.o. year old male who sees Benjamin Floor., MD for primary care. I spoke with  Benjamin Woods by phone today.  What matters to the patients health and wellness today?  Placed call to patient to review and offer [facility] care coordination program. Patient reports that he has leg swelling and a cough. Patient consent to program.  Appointment booked. Encouraged patient to weigh in the morning.     SDOH assessments and interventions completed:  No     Care Coordination Interventions:  Yes, provided   Follow up plan: Follow up call scheduled for 08/24/2022    Encounter Outcome:  Pt. Visit Completed   Benjamin Pavy, RN, BSN, CEN [facility] Coordinator [phone]

## 2022-08-24 ENCOUNTER — Ambulatory Visit: Payer: Self-pay

## 2022-08-24 NOTE — Patient Outreach (Signed)
  Care Coordination   Initial Visit Note   08/24/2022 Name: Benjamin Woods MRN: [id number] DOB: 06-08-1940  Benjamin Woods is a 82 y.o. year old male who sees [name], MD for primary care. I spoke with  [name] by phone today.  What matters to the patients health and wellness today?  Initial assessment today.  Patient reports his concern for his health is cough for 3-6 months.  Mostly clear sputum. Reports swelling to feet and not weighing. However states that he is taking his medications as directed.     Goals Addressed             This Visit's Progress    I have a bad cough       Interventions Today    Flowsheet Row Most Recent Value  Chronic Disease   Chronic disease during today's visit Congestive Heart Failure (CHF), Hypertension (HTN), Atrial Fibrillation (AFib), Other  [cough]  General Interventions   General Interventions Discussed/Reviewed General Interventions Discussed, Durable Medical Equipment (DME), Doctor Visits  Doctor Visits Discussed/Reviewed Doctor Visits Discussed, PCP  PCP/Specialist Visits Compliance with follow-up visit  Exercise Interventions   Exercise Discussed/Reviewed Exercise Discussed  Education Interventions   Education Provided Provided Education  [Reviewed heart failure zones and importance of daily weights with an empty bladder. Encoruaged patient to get a battery for his scale and start weighing.  Reviewed that patient was RX mucinex twice a day for cough that he is not taking.]  Provided Verbal Education On Nutrition, Exercise, Medication, When to see the doctor  Nutrition Interventions   Nutrition Discussed/Reviewed Nutrition Discussed, Decreasing salt  Pharmacy Interventions   Pharmacy Dicussed/Reviewed Pharmacy Topics Discussed, Medications and their functions, Medication Adherence  Medication Adherence Not taking medication  [mucinex]  Safety Interventions   Safety Discussed/Reviewed Fall Risk, Safety  Discussed      Encouraged patient to weigh daily and take his mucinex as prescribed. Follow up call planned for 1 week.        SDOH assessments and interventions completed:  Yes  SDOH Interventions Today    Flowsheet Row Most Recent Value  SDOH Interventions   Food Insecurity Interventions Intervention Not Indicated  Housing Interventions Intervention Not Indicated  Transportation Interventions Intervention Not Indicated  Utilities Interventions Intervention Not Indicated  Alcohol Usage Interventions Intervention Not Indicated (Score <7)  Financial Strain Interventions Intervention Not Indicated  Physical Activity Interventions Intervention Not Indicated        Care Coordination Interventions:  Yes, provided   Follow up plan: Follow up call scheduled for 08/30/2022    Encounter Outcome:  Pt. Visit Completed   Rowe Pavy, RN, BSN, CEN [facility] Coordinator [phone]

## 2022-08-30 ENCOUNTER — Ambulatory Visit: Payer: Self-pay

## 2022-08-30 NOTE — Patient Outreach (Signed)
  Care Coordination   Follow Up Visit Note   08/30/2022 Name: Benjamin Woods MRN: [id number] DOB: [birth year]  Benjamin Woods is a 82 y.o. year old male who sees Benjamin Woods., MD for primary care. I spoke with  Benjamin Woods by phone today.  What matters to the patients health and wellness today?  Patient reports that he is doing much better with his cough now that he is taking his mucinex. Reports weighing daily . Weight range of 220-225 pounds.  Reports that he is feeling some better.     Goals Addressed             This Visit's Progress    I have a bad cough       Interventions Today    Flowsheet Row Most Recent Value  Chronic Disease   Chronic disease during today's visit Other  [cough]  General Interventions   General Interventions Discussed/Reviewed General Interventions Reviewed, Doctor Visits  Doctor Visits Discussed/Reviewed Doctor Visits Discussed  Exercise Interventions   Exercise Discussed/Reviewed Exercise Reviewed  Education Interventions   Education Provided Provided Education  [Reviewed heart failure zones and when to call MD.]  Provided Verbal Education On Nutrition, Exercise, When to see the doctor  Pharmacy Interventions   Pharmacy Dicussed/Reviewed Pharmacy Topics Reviewed, Medications and their functions  Safety Interventions   Safety Discussed/Reviewed Fall Risk  [assessed recent falls.]              SDOH assessments and interventions completed:  No     Care Coordination Interventions:  Yes, provided   Follow up plan: Follow up call scheduled for 09/24/2022    Encounter Outcome:  Pt. Visit Completed   Benjamin Pavy, RN, BSN, CEN [facility] Coordinator [phone]

## 2022-09-05 DIAGNOSIS — Z125 Encounter for screening for malignant neoplasm of prostate: Secondary | ICD-10-CM | POA: Diagnosis not present

## 2022-09-05 DIAGNOSIS — N529 Male erectile dysfunction, unspecified: Secondary | ICD-10-CM | POA: Diagnosis not present

## 2022-09-05 DIAGNOSIS — N401 Enlarged prostate with lower urinary tract symptoms: Secondary | ICD-10-CM | POA: Diagnosis not present

## 2022-09-05 DIAGNOSIS — R339 Retention of urine, unspecified: Secondary | ICD-10-CM | POA: Diagnosis not present

## 2022-09-13 DIAGNOSIS — H6123 Impacted cerumen, bilateral: Secondary | ICD-10-CM | POA: Diagnosis not present

## 2022-09-13 DIAGNOSIS — J302 Other seasonal allergic rhinitis: Secondary | ICD-10-CM | POA: Diagnosis not present

## 2022-09-13 DIAGNOSIS — J45909 Unspecified asthma, uncomplicated: Secondary | ICD-10-CM | POA: Diagnosis not present

## 2022-09-13 DIAGNOSIS — Z6829 Body mass index (BMI) 29.0-29.9, adult: Secondary | ICD-10-CM | POA: Diagnosis not present

## 2022-09-24 ENCOUNTER — Ambulatory Visit: Payer: Self-pay

## 2022-09-24 DIAGNOSIS — Z9181 History of falling: Secondary | ICD-10-CM | POA: Diagnosis not present

## 2022-09-24 DIAGNOSIS — Z0000 Encounter for general adult medical examination without abnormal findings: Secondary | ICD-10-CM | POA: Diagnosis not present

## 2022-09-24 NOTE — Patient Outreach (Signed)
  Care Coordination   Follow Up Visit Note   09/24/2022 Name: Benjamin Woods MRN: [id number] DOB: July 31, 1940  Benjamin Woods is a 82 y.o. year old male who sees [name], MD for primary care. I spoke with  [name] by phone today.  What matters to the patients health and wellness today?  Follow up call with patient today. Patient reports that he is doing well and reports decrease in cough. He continues to take his medications as prescribed.  States that he is an appointment with PCP today.  Denies any new problems or concerns today.  Reports weight of 217-218 pounds.    Goals Addressed             This Visit's Progress    I have a bad cough       Interventions Today    Flowsheet Row Most Recent Value  Chronic Disease   Chronic disease during today's visit Other  [cough]  General Interventions   General Interventions Discussed/Reviewed General Interventions Discussed, Doctor Visits  Doctor Visits Discussed/Reviewed Doctor Visits Discussed  Education Interventions   Education Provided Provided Education  [Encoruaged patient to talk to MD today at office visit about his recent cough and his medications.]  Provided Verbal Education On Medication  Pharmacy Interventions   Pharmacy Dicussed/Reviewed Medications and their functions  Safety Interventions   Safety Discussed/Reviewed Fall Risk              SDOH assessments and interventions completed:  No     Care Coordination Interventions:  Yes, provided   Follow up plan: Follow up call scheduled for 10/22/2022    Encounter Outcome:  Pt. Visit Completed   Rowe Pavy, RN, BSN, CEN [facility] Coordinator [phone]

## 2022-10-22 ENCOUNTER — Ambulatory Visit: Payer: Self-pay

## 2022-10-22 NOTE — Patient Outreach (Signed)
  Care Coordination   Follow Up Visit Note   10/22/2022 Name: Benjamin Woods MRN: [id number] DOB: 1940/12/12  Benjamin Woods is a 82 y.o. year old male who sees Benjamin Floor., MD for primary care. I spoke with  Benjamin Woods by phone today.  What matters to the patients health and wellness today?  Follow up call with patient today. Patient reports that he is having swelling in his legs.  Reports that he has been eating some salty things.  Denies chest pain or shortness of breath.   Goals Addressed             This Visit's Progress    I have a bad cough       Interventions Today    Flowsheet Row Most Recent Value  Chronic Disease   Chronic disease during today's visit Hypertension (HTN)  General Interventions   General Interventions Discussed/Reviewed General Interventions Discussed  Nutrition Interventions   Nutrition Discussed/Reviewed Nutrition Discussed, Decreasing salt  Pharmacy Interventions   Pharmacy Dicussed/Reviewed Medications and their functions      Reviewed with patient to elevate his legs. Reviewed importance of low salt diet.  Encouraged patient to call MD if he continues to have swelling in his legs.   Follow up planned for 2 months.         SDOH assessments and interventions completed:  No     Care Coordination Interventions:  Yes, provided   Follow up plan: Follow up call scheduled for 12/24/2022    Encounter Outcome:  Pt. Visit Completed   Benjamin Pavy, RN, BSN, CEN [facility] Coordinator [phone]

## 2022-11-27 DIAGNOSIS — I739 Peripheral vascular disease, unspecified: Secondary | ICD-10-CM | POA: Diagnosis not present

## 2022-11-27 DIAGNOSIS — D696 Thrombocytopenia, unspecified: Secondary | ICD-10-CM | POA: Diagnosis not present

## 2022-11-27 DIAGNOSIS — Z139 Encounter for screening, unspecified: Secondary | ICD-10-CM | POA: Diagnosis not present

## 2022-11-27 DIAGNOSIS — I10 Essential (primary) hypertension: Secondary | ICD-10-CM | POA: Diagnosis not present

## 2022-11-27 DIAGNOSIS — E785 Hyperlipidemia, unspecified: Secondary | ICD-10-CM | POA: Diagnosis not present

## 2022-11-27 DIAGNOSIS — I5020 Unspecified systolic (congestive) heart failure: Secondary | ICD-10-CM | POA: Diagnosis not present

## 2022-11-27 DIAGNOSIS — Z6829 Body mass index (BMI) 29.0-29.9, adult: Secondary | ICD-10-CM | POA: Diagnosis not present

## 2022-11-27 DIAGNOSIS — N184 Chronic kidney disease, stage 4 (severe): Secondary | ICD-10-CM | POA: Diagnosis not present

## 2022-12-12 DIAGNOSIS — H2511 Age-related nuclear cataract, right eye: Secondary | ICD-10-CM | POA: Diagnosis not present

## 2022-12-24 ENCOUNTER — Ambulatory Visit: Payer: Self-pay

## 2022-12-24 NOTE — Patient Outreach (Signed)
  Care Coordination   Follow Up Visit Note   12/24/2022 Name: ZANDYR MEADER MRN: [id number] DOB: [birth year]  BIRDIE GROTHAUS is a 82 y.o. year old male who sees [name], MD for primary care. I spoke with  [name] by phone today.  What matters to the patients health and wellness today?  Patient reports that he has decreased swelling in his legs.  Reports that he is wearing his compression hose.   No changes to medications.  Reports decrease in cough.  Using the incentive spirometry at home.   Denies any new problems.   Reports weight range of 216-217 pounds.     Goals Addressed             This Visit's Progress    I have a bad cough       Interventions Today    Flowsheet Row Most Recent Value  Chronic Disease   Chronic disease during today's visit Congestive Heart Failure (CHF), Other  [cough]  General Interventions   General Interventions Discussed/Reviewed General Interventions Discussed, Doctor Visits  Doctor Visits Discussed/Reviewed Doctor Visits Discussed, PCP  Exercise Interventions   Exercise Discussed/Reviewed Physical Activity  Education Interventions   Education Provided Provided Education  [Reviewed importance of low salt diet, daily weights, CHF zones.  Reviewed use of compression hose.]  Provided Verbal Education On Nutrition, Foot Care, Exercise, Medication, When to see the doctor, Other  [compression hose]  Nutrition Interventions   Nutrition Discussed/Reviewed Nutrition Discussed, Decreasing salt  Pharmacy Interventions   Pharmacy Dicussed/Reviewed Medications and their functions  Safety Interventions   Safety Discussed/Reviewed Fall Risk      Reviewed with patient to call me if needed.   Patient wanted follow up call in 3 months.  Appointment scheduled.         SDOH assessments and interventions completed:  No     Care Coordination Interventions:  Yes, provided   Follow up plan: Follow up call scheduled for  03/25/2023    Encounter Outcome:  Pt. Visit Completed   Rowe Pavy, RN, BSN, CEN [facility] Coordinator [phone]

## 2023-01-11 ENCOUNTER — Other Ambulatory Visit: Payer: Self-pay | Admitting: Cardiology

## 2023-01-13 NOTE — Progress Notes (Unsigned)
Cardiology Office Note:  .   Date:  01/14/2023  ID:  [name], DOB May 09, 1940, MRN [id number] PCP: [name], MD  [facility] Providers Cardiologist:  None    History of Present Illness: .   Benjamin Woods is a 82 y.o. male with a past medical history of stroke, hypertension, combined heart failure, history of DVT, cardiomyopathy, pulmonary embolism, GERD, BPH, gout, history of syncope, dyslipidemia.  Most recently evaluated by Dr. 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  He presents today for follow-up of his hypertension and heart failure.  He has no formal complaints today.  At his last OV he was bothered by DOE, repeat echocardiogram revealed HFmrEF, he is not bothered by this anymore, states he was started on Mucinex and this has helped him tremendously.  He denies hematochezia, hematuria, hemoptysis. He denies chest pain, palpitations, dyspnea, pnd, orthopnea, n, v, dizziness, syncope, edema, weight gain, or early satiety.   ROS: Review of Systems  Cardiovascular:  Positive for leg swelling.  All other systems reviewed and are negative.    Studies Reviewed: Marland Kitchen   EKG Interpretation Date/Time:  Monday January 14 2023 15:16:15 EDT Ventricular Rate:  63 PR Interval:  250 QRS Duration:  98 QT Interval:  404 QTC Calculation: 413 R Axis:   -9  Text Interpretation: Sinus rhythm with 1st degree A-V block Otherwise normal ECG When compared with ECG of 29-Dec-2021 13:06, Premature ventricular complexes are no longer Present Confirmed by Wallis Bamberg [phone]) on 01/14/2023 4:05:34 PM    Cardiac Studies & Procedures       ECHOCARDIOGRAM  ECHOCARDIOGRAM COMPLETE 07/18/2022  Narrative ECHOCARDIOGRAM REPORT    Patient Name:    MELACHI TEES Date of Exam: 07/18/2022 Medical Rec #:  [id number]          Height:       72.0 in Accession #:    [id number]         Weight:       225.0 lb Date of Birth:  06-07-1940          BSA:          2.240 m Patient Age:    81 years           BP:           110/70 mmHg Patient Gender: M                  HR:           78 bpm. Exam Location:  [place]  Procedure: 2D Echo, Cardiac Doppler, Color Doppler and Strain Analysis  Indications:    Dyspnea on exertion [R06.09 (ICD-10-CM)]  History:        Patient has prior history of Echocardiogram examinations, most recent 07/05/2021. Cardiomyopathy and CHF, Stroke and DVT; Risk Factors:Dyslipidemia and Hypertension.  Sonographer:    Louie Boston RDCS Referring Phys: [phone] Georgeanna Lea   Sonographer Comments: Suboptimal subcostal window. IMPRESSIONS   1. Left ventricular ejection fraction, by estimation, is 45 to 50%. The left ventricle has mildly decreased function. The left ventricle has no regional wall motion abnormalities. Left ventricular diastolic parameters are consistent with Grade I diastolic dysfunction (impaired relaxation). Elevated left ventricular end-diastolic pressure. 2. Right ventricular  systolic function is normal. The right ventricular size is normal. 3. Left atrial size was mildly dilated. 4. The mitral valve is degenerative. No evidence of mitral valve regurgitation. No evidence of mitral stenosis. 5. The aortic valve is tricuspid. Aortic valve regurgitation is not visualized. No aortic stenosis is present. 6. 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  FINDINGS Left Ventricle: Left ventricular ejection fraction, by estimation, is 45 to 50%. The left ventricle has mildly decreased function. The left ventricle has no regional wall motion abnormalities. Global longitudinal strain performed but not reported based on interpreter judgement due to suboptimal tracking.  The left ventricular internal cavity size was normal in size. There is no left ventricular hypertrophy. Left ventricular diastolic parameters are consistent with Grade I diastolic dysfunction (impaired relaxation). Elevated left ventricular end-diastolic pressure.  Right Ventricle: The right ventricular size is normal. No increase in right ventricular wall thickness. Right ventricular systolic function is normal.  Left Atrium: Left atrial size was mildly dilated.  Right Atrium: Right atrial size was normal in size.  Pericardium: There is no evidence of pericardial effusion.  Mitral Valve: The mitral valve is degenerative in appearance. Mild mitral annular calcification. No evidence of mitral valve regurgitation. No evidence of mitral valve stenosis.  Tricuspid Valve: The tricuspid valve is normal in structure. Tricuspid valve regurgitation is mild . No evidence of tricuspid stenosis.  Aortic Valve: The aortic valve is tricuspid. Aortic valve regurgitation is not visualized. No aortic stenosis is present.  Pulmonic Valve: The pulmonic valve was normal in structure. Pulmonic valve regurgitation is not visualized. No evidence of pulmonic stenosis.  Aorta: The aortic root is normal in size and structure. 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  Venous: A normal flow pattern is recorded from the right upper pulmonary vein. The inferior vena cava was not well visualized.  IAS/Shunts: No atrial level shunt detected by color flow Doppler.   LEFT VENTRICLE PLAX 2D LVIDd:         4.70 cm      Diastology LVIDs:         3.90 cm      LV e' medial:    5.66 cm/s LV PW:         1.00 cm      LV E/e' medial:  11.9 LV IVS:        1.00 cm      LV e' lateral:   9.90 cm/s LVOT diam:     2.10 cm      LV E/e' lateral: 6.8 LV SV:         76 LV SV Index:   34 LVOT Area:     3.46 cm  LV Volumes (MOD) LV vol d, MOD A2C: 105.0 ml LV vol d, MOD A4C:  112.0 ml LV vol s, MOD A2C: 56.0 ml LV vol s, MOD A4C: 55.0 ml LV SV MOD A2C:     49.0 ml LV SV MOD A4C:     112.0 ml LV SV MOD BP:      53.4 ml  RIGHT VENTRICLE RV S prime:     10.80 cm/s TAPSE (M-mode): 2.9 cm  LEFT ATRIUM              Index        RIGHT ATRIUM           Index LA diam:  4.70 cm  2.10 cm/m   RA Area:     15.60 cm LA Vol (A2C):   101.0 ml 45.09 ml/m  RA Volume:   39.00 ml  17.41 ml/m LA Vol (A4C):   78.1 ml  34.87 ml/m LA Biplane Vol: 89.2 ml  39.82 ml/m AORTIC VALVE LVOT Vmax:   119.00 cm/s LVOT Vmean:  76.500 cm/s LVOT VTI:    0.220 m  AORTA Ao Root diam: 4.10 cm Ao Asc diam:  3.80 cm  MV E velocity: 67.10 cm/s  TRICUSPID VALVE MV A velocity: 95.90 cm/s  TR Peak grad:   33.4 mmHg MV E/A ratio:  0.70        TR Vmax:        289.00 cm/s  SHUNTS Systemic VTI:  0.22 m Systemic Diam: 2.10 cm  Norman Herrlich MD Electronically signed by Norman Herrlich MD Signature Date/Time: 07/18/2022/6:15:48 PM    Final             Risk Assessment/Calculations:             Physical Exam:   VS:  BP 116/78 (BP Location: Right Arm, Patient Position: Sitting, Cuff Size: Normal)   Pulse 63   Ht 6' (1.829 m)   Wt 222 lb 12.8 oz (101.1 kg)   SpO2 97%   BMI 30.22 kg/m    Wt Readings from Last 3 Encounters:  01/14/23 222 lb 12.8 oz (101.1 kg)  09/24/22 217 lb (98.4 kg)  08/30/22 225 lb (102.1 kg)    GEN: Well nourished, well developed in no acute distress NECK: No JVD; No carotid bruits CARDIAC: RRR, no murmurs, rubs, gallops RESPIRATORY:  Clear to auscultation without rales, wheezing or rhonchi  ABDOMEN: Soft, non-tender, non-distended EXTREMITIES:  +1 pitting edema; No deformity   ASSESSMENT AND PLAN: .   HFmrEF -most recent echo revealed an improvement of his EF from 45 to 50%, NYHA class I today, euvolemic.  Continue Coreg 25 mg twice daily, continue Entresto 24-26 mg twice daily, continue Lasix 20 mg daily. Hypertension-blood pressure today is  well-controlled at 116/78, continue Entresto 24-26 mg twice daily, continue Norvasc 10 mg daily. Dyslipidemia-most recent LDL was well-controlled at 52 on 11/27/2022, continue Lipitor 20 mg daily   History of PE/DVT -currently anticoagulated with Eliquis 5 mg twice daily--no indication for dose reduction, most recent hemoglobin was 13.8 in July of this year and his creatinine was 1.3.    Dispo: Return in 6 months.  Signed, Flossie Dibble, NP

## 2023-01-14 ENCOUNTER — Ambulatory Visit: Payer: Medicare HMO | Attending: Cardiology | Admitting: Cardiology

## 2023-01-14 ENCOUNTER — Encounter: Payer: Self-pay | Admitting: Cardiology

## 2023-01-14 VITALS — BP 116/78 | HR 63 | Ht 72.0 in | Wt 222.8 lb

## 2023-01-14 DIAGNOSIS — I5020 Unspecified systolic (congestive) heart failure: Secondary | ICD-10-CM

## 2023-01-14 DIAGNOSIS — Z86711 Personal history of pulmonary embolism: Secondary | ICD-10-CM

## 2023-01-14 DIAGNOSIS — Z86718 Personal history of other venous thrombosis and embolism: Secondary | ICD-10-CM

## 2023-01-14 DIAGNOSIS — I429 Cardiomyopathy, unspecified: Secondary | ICD-10-CM

## 2023-01-14 DIAGNOSIS — I5022 Chronic systolic (congestive) heart failure: Secondary | ICD-10-CM | POA: Diagnosis not present

## 2023-01-14 DIAGNOSIS — I10 Essential (primary) hypertension: Secondary | ICD-10-CM | POA: Diagnosis not present

## 2023-01-14 MED ORDER — ATORVASTATIN CALCIUM 20 MG PO TABS: 20.0000 mg | ORAL_TABLET | Freq: Every evening | ORAL | 3 refills | Status: DC

## 2023-01-14 NOTE — Patient Instructions (Signed)
Medication Instructions:  Your physician recommends that you continue on your current medications as directed. Please refer to the Current Medication list given to you today.  *If you need a refill on your cardiac medications before your next appointment, please call your pharmacy*   Lab Work: NONE If you have labs (blood work) drawn today and your tests are completely normal, you will receive your results only by: MyChart Message (if you have MyChart) OR A paper copy in the mail If you have any lab test that is abnormal or we need to change your treatment, we will call you to review the results.   Testing/Procedures: NONE   Follow-Up: At [facility], you and your health needs are our priority.  As part of our continuing mission to provide you with exceptional heart care, we have created designated Provider Care Teams.  These Care Teams include your primary Cardiologist (physician) and Advanced Practice Providers (APPs -  Physician Assistants and Nurse Practitioners) who all work together to provide you with the care you need, when you need it.  We recommend signing up for the patient portal called "MyChart".  Sign up information is provided on this After Visit Summary.  MyChart is used to connect with patients for Virtual Visits (Telemedicine).  Patients are able to view lab/test results, encounter notes, upcoming appointments, etc.  Non-urgent messages can be sent to your provider as well.   To learn more about what you can do with MyChart, go to [email or web address].    Your next appointment:   6 month(s)  Provider:   Robert Krasowski, MD    Other Instructions   

## 2023-01-28 DIAGNOSIS — R339 Retention of urine, unspecified: Secondary | ICD-10-CM | POA: Diagnosis not present

## 2023-01-28 DIAGNOSIS — N401 Enlarged prostate with lower urinary tract symptoms: Secondary | ICD-10-CM | POA: Diagnosis not present

## 2023-03-14 ENCOUNTER — Telehealth: Payer: Self-pay

## 2023-03-14 NOTE — Telephone Encounter (Signed)
S/w patient advise, I receive applications for [employer](entresto) and [employer](Eliqius). Aware will need proof of income and itemize statement from the pharmacy of prescriptions he paid in the 2024 year. He verbalize understanding.

## 2023-03-19 NOTE — Telephone Encounter (Signed)
Received missing information, both applications completed and faxed.

## 2023-03-25 ENCOUNTER — Ambulatory Visit: Payer: Self-pay

## 2023-03-25 NOTE — Patient Outreach (Signed)
  Care Coordination   Follow Up Visit Note   03/25/2023 Name: [name] MRN: [id number] DOB: 05/26/1940  Benjamin Woods is a 82 y.o. year old male who sees [name], MD for primary care. I spoke with  [name] by phone today.  What matters to the patients health and wellness today?  Manage health    Goals Addressed             This Visit's Progress    Managing Heart Failure       Patient Goals/Self Care Activities: -Patient/Caregiver will take medications as prescribed   -Patient/Caregiver will attend all scheduled provider appointments -Patient/Caregiver will call provider office for new concerns or questions   -Weigh daily and record (notify MD with 3 lb weight gain over night or 5 lb in a week) -Follow CHF Action Plan -Adhere to low sodium diet    Patient  doing alright.  Weight 226 lbs.  Maintaining cough with mucinex-d.  Discussed heart failure management.  PCP appointment this month.  No concerns.           SDOH assessments and interventions completed:  Yes  SDOH Interventions Today    Flowsheet Row Most Recent Value  SDOH Interventions   Food Insecurity Interventions Intervention Not Indicated  Housing Interventions Intervention Not Indicated  Transportation Interventions Intervention Not Indicated  Utilities Interventions Intervention Not Indicated  Health Literacy Interventions Intervention Not Indicated        Care Coordination Interventions:  Yes, provided   Follow up plan: Follow up call scheduled for January    Encounter Outcome:  Patient Visit Completed   Bary Leriche, RN, MSN [place]  [facility], [facility] Management Community Coordinator Direct Dial: [phone]  Fax: [phone] Website: [email or web address]

## 2023-03-25 NOTE — Patient Instructions (Signed)
Visit Information  Thank you for taking time to visit with me today. Please don't hesitate to contact me if I can be of assistance to you.   Following are the goals we discussed today:   Goals Addressed             This Visit's Progress    Managing Heart Failure       Patient Goals/Self Care Activities: -Patient/Caregiver will take medications as prescribed   -Patient/Caregiver will attend all scheduled provider appointments -Patient/Caregiver will call provider office for new concerns or questions   -Weigh daily and record (notify MD with 3 lb weight gain over night or 5 lb in a week) -Follow CHF Action Plan -Adhere to low sodium diet    Patient  doing alright.  Weight 226 lbs.  Maintaining cough with mucinex-d.  Discussed heart failure management.  PCP appointment this month.  No concerns.           Our next appointment is by telephone on 05/19/22 at 1200 pm  Please call the care guide team at [phone] if you need to cancel or reschedule your appointment.   If you are experiencing a Mental Health or Behavioral Health Crisis or need someone to talk to, please call the Suicide and Crisis Lifeline: 988   The patient verbalized understanding of instructions, educational materials, and care plan provided today and DECLINED offer to receive copy of patient instructions, educational materials, and care plan.   The patient has been provided with contact information for the care management team and has been advised to call with any health related questions or concerns.   Bary Leriche, RN, MSN [facility], [facility] Management Community Coordinator Direct Dial: [phone]  Fax: [phone] Website: [email or web address]

## 2023-03-26 ENCOUNTER — Telehealth: Payer: Self-pay

## 2023-03-26 NOTE — Telephone Encounter (Addendum)
Extr Help Program- [phone]  Application denied, per [employer] patient income require to first apply for the Extra Help Program through Medicare. If denied then denial letter is to be fax to [employer] reopen current application/. I called and LM to return my call.

## 2023-03-26 NOTE — Telephone Encounter (Signed)
Spoke with patient, notified of the following and declined to proceed with applying with Medicare 's program.

## 2023-03-26 NOTE — Telephone Encounter (Signed)
Error

## 2023-03-28 DIAGNOSIS — Z6831 Body mass index (BMI) 31.0-31.9, adult: Secondary | ICD-10-CM | POA: Diagnosis not present

## 2023-03-28 DIAGNOSIS — I739 Peripheral vascular disease, unspecified: Secondary | ICD-10-CM | POA: Diagnosis not present

## 2023-03-28 DIAGNOSIS — Z139 Encounter for screening, unspecified: Secondary | ICD-10-CM | POA: Diagnosis not present

## 2023-03-28 DIAGNOSIS — I10 Essential (primary) hypertension: Secondary | ICD-10-CM | POA: Diagnosis not present

## 2023-03-28 DIAGNOSIS — N184 Chronic kidney disease, stage 4 (severe): Secondary | ICD-10-CM | POA: Diagnosis not present

## 2023-03-28 DIAGNOSIS — D696 Thrombocytopenia, unspecified: Secondary | ICD-10-CM | POA: Diagnosis not present

## 2023-03-28 DIAGNOSIS — I5020 Unspecified systolic (congestive) heart failure: Secondary | ICD-10-CM | POA: Diagnosis not present

## 2023-03-28 DIAGNOSIS — E785 Hyperlipidemia, unspecified: Secondary | ICD-10-CM | POA: Diagnosis not present

## 2023-04-30 NOTE — Telephone Encounter (Addendum)
Patient aware application denied , must apply for the Extra Help program. Patient declined to proceed. Application closed.

## 2023-05-20 ENCOUNTER — Ambulatory Visit: Payer: Self-pay

## 2023-05-20 NOTE — Patient Outreach (Signed)
  Care Coordination   Follow Up Visit Note   05/20/2023 Name: Benjamin Woods MRN: [id number] DOB: 1940-06-14  Benjamin Woods is a 83 y.o. year old male who sees Elaine Garnette BIRCH., MD for primary care. I spoke with  Benjamin Woods by phone today.  What matters to the patients health and wellness today?  Heart failure management    Goals Addressed             This Visit's Progress    COMPLETED: Managing Heart Failure       Patient Goals/Self Care Activities: -Patient/Caregiver will take medications as prescribed   -Patient/Caregiver will attend all scheduled provider appointments -Patient/Caregiver will call provider office for new concerns or questions   -Weigh daily and record (notify MD with 3 lb weight gain over night or 5 lb in a week) -Follow CHF Action Plan -Adhere to low sodium diet    Patient  doing alright.  Weight 227 lbs.  Reports eating low salt diet.  Reviewed heart failure management.  Patient meeting goals and no recent hospitalizations.  RN CM will close case and patient agreeable.             SDOH assessments and interventions completed:  Yes  SDOH Interventions Today    Flowsheet Row Most Recent Value  SDOH Interventions   Food Insecurity Interventions Intervention Not Indicated  Housing Interventions Intervention Not Indicated        Care Coordination Interventions:  Yes, provided   Follow up plan: No further intervention required.   Encounter Outcome:  Patient Visit Completed   Benjamin Collister J Tay Whitwell, RN, MSN RN Care Manager [facility], Population Health Direct Dial: [phone]  Fax: [phone] Website: [email or web address]

## 2023-05-20 NOTE — Patient Instructions (Signed)
 Visit Information  Thank you for taking time to visit with me today. Please don't hesitate to contact me if I can be of assistance to you.   Following are the goals we discussed today:   Goals Addressed             This Visit's Progress    COMPLETED: Managing Heart Failure       Patient Goals/Self Care Activities: -Patient/Caregiver will take medications as prescribed   -Patient/Caregiver will attend all scheduled provider appointments -Patient/Caregiver will call provider office for new concerns or questions   -Weigh daily and record (notify MD with 3 lb weight gain over night or 5 lb in a week) -Follow CHF Action Plan -Adhere to low sodium diet    Patient  doing alright.  Weight 227 lbs.  Reports eating low salt diet.  Reviewed heart failure management.  Patient meeting goals and no recent hospitalizations.  RN CM will close case and patient agreeable.              If you are experiencing a Mental Health or Behavioral Health Crisis or need someone to talk to, please call the Suicide and Crisis Lifeline: 988   The patient verbalized understanding of instructions, educational materials, and care plan provided today and DECLINED offer to receive copy of patient instructions, educational materials, and care plan.   The patient has been provided with contact information for the care management team and has been advised to call with any health related questions or concerns.   Henok Heacock J Carolena Fairbank, RN, MSN RN Care Manager [facility], Population Health Direct Dial: [phone]  Fax: [phone] Website: [email or web address]

## 2023-07-10 ENCOUNTER — Other Ambulatory Visit: Payer: Self-pay

## 2023-07-10 ENCOUNTER — Telehealth: Payer: Self-pay | Admitting: Cardiology

## 2023-07-10 ENCOUNTER — Encounter (HOSPITAL_BASED_OUTPATIENT_CLINIC_OR_DEPARTMENT_OTHER): Payer: Self-pay | Admitting: Emergency Medicine

## 2023-07-10 ENCOUNTER — Emergency Department (HOSPITAL_BASED_OUTPATIENT_CLINIC_OR_DEPARTMENT_OTHER)

## 2023-07-10 ENCOUNTER — Inpatient Hospital Stay (HOSPITAL_BASED_OUTPATIENT_CLINIC_OR_DEPARTMENT_OTHER)
Admission: EM | Admit: 2023-07-10 | Discharge: 2023-07-16 | DRG: 291 | Disposition: A | Discharge status: Home / Self Care | Attending: Internal Medicine | Admitting: Internal Medicine

## 2023-07-10 DIAGNOSIS — R0602 Shortness of breath: Secondary | ICD-10-CM | POA: Diagnosis not present

## 2023-07-10 DIAGNOSIS — E1122 Type 2 diabetes mellitus with diabetic chronic kidney disease: Secondary | ICD-10-CM | POA: Diagnosis present

## 2023-07-10 DIAGNOSIS — N289 Disorder of kidney and ureter, unspecified: Secondary | ICD-10-CM | POA: Diagnosis not present

## 2023-07-10 DIAGNOSIS — N3090 Cystitis, unspecified without hematuria: Secondary | ICD-10-CM | POA: Diagnosis present

## 2023-07-10 DIAGNOSIS — J9811 Atelectasis: Secondary | ICD-10-CM | POA: Diagnosis present

## 2023-07-10 DIAGNOSIS — Z86711 Personal history of pulmonary embolism: Secondary | ICD-10-CM

## 2023-07-10 DIAGNOSIS — R54 Age-related physical debility: Secondary | ICD-10-CM | POA: Diagnosis present

## 2023-07-10 DIAGNOSIS — I5021 Acute systolic (congestive) heart failure: Secondary | ICD-10-CM

## 2023-07-10 DIAGNOSIS — K219 Gastro-esophageal reflux disease without esophagitis: Secondary | ICD-10-CM | POA: Diagnosis present

## 2023-07-10 DIAGNOSIS — Z7901 Long term (current) use of anticoagulants: Secondary | ICD-10-CM | POA: Diagnosis not present

## 2023-07-10 DIAGNOSIS — I5043 Acute on chronic combined systolic (congestive) and diastolic (congestive) heart failure: Secondary | ICD-10-CM | POA: Diagnosis not present

## 2023-07-10 DIAGNOSIS — I130 Hypertensive heart and chronic kidney disease with heart failure and stage 1 through stage 4 chronic kidney disease, or unspecified chronic kidney disease: Secondary | ICD-10-CM | POA: Diagnosis not present

## 2023-07-10 DIAGNOSIS — I509 Heart failure, unspecified: Principal | ICD-10-CM

## 2023-07-10 DIAGNOSIS — I82409 Acute embolism and thrombosis of unspecified deep veins of unspecified lower extremity: Secondary | ICD-10-CM | POA: Diagnosis present

## 2023-07-10 DIAGNOSIS — I5033 Acute on chronic diastolic (congestive) heart failure: Secondary | ICD-10-CM | POA: Diagnosis present

## 2023-07-10 DIAGNOSIS — Z79899 Other long term (current) drug therapy: Secondary | ICD-10-CM | POA: Diagnosis not present

## 2023-07-10 DIAGNOSIS — N390 Urinary tract infection, site not specified: Secondary | ICD-10-CM | POA: Diagnosis not present

## 2023-07-10 DIAGNOSIS — J9601 Acute respiratory failure with hypoxia: Secondary | ICD-10-CM | POA: Diagnosis present

## 2023-07-10 DIAGNOSIS — R001 Bradycardia, unspecified: Secondary | ICD-10-CM

## 2023-07-10 DIAGNOSIS — N400 Enlarged prostate without lower urinary tract symptoms: Secondary | ICD-10-CM | POA: Diagnosis present

## 2023-07-10 DIAGNOSIS — J90 Pleural effusion, not elsewhere classified: Secondary | ICD-10-CM | POA: Diagnosis not present

## 2023-07-10 DIAGNOSIS — E785 Hyperlipidemia, unspecified: Secondary | ICD-10-CM | POA: Diagnosis present

## 2023-07-10 DIAGNOSIS — Z86718 Personal history of other venous thrombosis and embolism: Secondary | ICD-10-CM

## 2023-07-10 DIAGNOSIS — I77810 Thoracic aortic ectasia: Secondary | ICD-10-CM | POA: Diagnosis not present

## 2023-07-10 DIAGNOSIS — I824Z2 Acute embolism and thrombosis of unspecified deep veins of left distal lower extremity: Secondary | ICD-10-CM | POA: Diagnosis not present

## 2023-07-10 DIAGNOSIS — I110 Hypertensive heart disease with heart failure: Secondary | ICD-10-CM | POA: Diagnosis not present

## 2023-07-10 DIAGNOSIS — D61818 Other pancytopenia: Secondary | ICD-10-CM | POA: Diagnosis present

## 2023-07-10 DIAGNOSIS — Z87891 Personal history of nicotine dependence: Secondary | ICD-10-CM | POA: Diagnosis not present

## 2023-07-10 DIAGNOSIS — N1831 Chronic kidney disease, stage 3a: Secondary | ICD-10-CM | POA: Diagnosis present

## 2023-07-10 DIAGNOSIS — N179 Acute kidney failure, unspecified: Secondary | ICD-10-CM | POA: Diagnosis not present

## 2023-07-10 DIAGNOSIS — I441 Atrioventricular block, second degree: Secondary | ICD-10-CM | POA: Diagnosis not present

## 2023-07-10 DIAGNOSIS — E669 Obesity, unspecified: Secondary | ICD-10-CM | POA: Diagnosis present

## 2023-07-10 DIAGNOSIS — Z8673 Personal history of transient ischemic attack (TIA), and cerebral infarction without residual deficits: Secondary | ICD-10-CM

## 2023-07-10 DIAGNOSIS — I10 Essential (primary) hypertension: Secondary | ICD-10-CM | POA: Diagnosis not present

## 2023-07-10 DIAGNOSIS — Z1152 Encounter for screening for COVID-19: Secondary | ICD-10-CM | POA: Diagnosis not present

## 2023-07-10 DIAGNOSIS — R0989 Other specified symptoms and signs involving the circulatory and respiratory systems: Secondary | ICD-10-CM | POA: Diagnosis not present

## 2023-07-10 DIAGNOSIS — N401 Enlarged prostate with lower urinary tract symptoms: Secondary | ICD-10-CM | POA: Diagnosis not present

## 2023-07-10 DIAGNOSIS — M109 Gout, unspecified: Secondary | ICD-10-CM | POA: Diagnosis present

## 2023-07-10 DIAGNOSIS — M7989 Other specified soft tissue disorders: Secondary | ICD-10-CM | POA: Diagnosis not present

## 2023-07-10 DIAGNOSIS — R918 Other nonspecific abnormal finding of lung field: Secondary | ICD-10-CM | POA: Diagnosis not present

## 2023-07-10 DIAGNOSIS — Z6831 Body mass index (BMI) 31.0-31.9, adult: Secondary | ICD-10-CM

## 2023-07-10 LAB — CBC WITH DIFFERENTIAL/PLATELET
Abs Immature Granulocytes: 0.01 10*3/uL (ref 0.00–0.07)
Basophils Absolute: 0 10*3/uL (ref 0.0–0.1)
Basophils Relative: 1 %
Eosinophils Absolute: 0.2 10*3/uL (ref 0.0–0.5)
Eosinophils Relative: 5 %
HCT: 39.2 % (ref 39.0–52.0)
Hemoglobin: 12 g/dL — ABNORMAL LOW (ref 13.0–17.0)
Immature Granulocytes: 0 %
Lymphocytes Relative: 36 %
Lymphs Abs: 1.6 10*3/uL (ref 0.7–4.0)
MCH: 26.5 pg (ref 26.0–34.0)
MCHC: 30.6 g/dL (ref 30.0–36.0)
MCV: 86.7 fL (ref 80.0–100.0)
Monocytes Absolute: 0.5 10*3/uL (ref 0.1–1.0)
Monocytes Relative: 11 %
Neutro Abs: 2 10*3/uL (ref 1.7–7.7)
Neutrophils Relative %: 47 %
Platelets: 109 10*3/uL — ABNORMAL LOW (ref 150–400)
RBC: 4.52 MIL/uL (ref 4.22–5.81)
RDW: 15.5 % (ref 11.5–15.5)
WBC: 4.3 10*3/uL (ref 4.0–10.5)
nRBC: 0 % (ref 0.0–0.2)

## 2023-07-10 LAB — BASIC METABOLIC PANEL
Anion gap: 4 — ABNORMAL LOW (ref 5–15)
BUN: 27 mg/dL — ABNORMAL HIGH (ref 8–23)
CO2: 26 mmol/L (ref 22–32)
Calcium: 9 mg/dL (ref 8.9–10.3)
Chloride: 104 mmol/L (ref 98–111)
Creatinine, Ser: 1.42 mg/dL — ABNORMAL HIGH (ref 0.61–1.24)
GFR, Estimated: 49 mL/min — ABNORMAL LOW (ref >60)
Glucose, Bld: 107 mg/dL — ABNORMAL HIGH (ref 70–99)
Potassium: 4.8 mmol/L (ref 3.5–5.1)
Sodium: 134 mmol/L — ABNORMAL LOW (ref 135–145)

## 2023-07-10 LAB — HEPATIC FUNCTION PANEL
ALT: 27 U/L (ref 0–44)
AST: 29 U/L (ref 15–41)
Albumin: 3.3 g/dL — ABNORMAL LOW (ref 3.5–5.0)
Alkaline Phosphatase: 143 U/L — ABNORMAL HIGH (ref 38–126)
Bilirubin, Direct: 0.1 mg/dL (ref 0.0–0.2)
Indirect Bilirubin: 0.4 mg/dL (ref 0.3–0.9)
Total Bilirubin: 0.5 mg/dL (ref 0.0–1.2)
Total Protein: 8.2 g/dL — ABNORMAL HIGH (ref 6.5–8.1)

## 2023-07-10 LAB — BRAIN NATRIURETIC PEPTIDE: B Natriuretic Peptide: 426.6 pg/mL — ABNORMAL HIGH (ref 0.0–100.0)

## 2023-07-10 LAB — TROPONIN I (HIGH SENSITIVITY)
Troponin I (High Sensitivity): 10 ng/L (ref <18)
Troponin I (High Sensitivity): 10 ng/L (ref <18)

## 2023-07-10 MED ORDER — FUROSEMIDE 10 MG/ML IJ SOLN
60.0000 mg | Freq: Once | INTRAMUSCULAR | Status: AC
  Administered 2023-07-10: 60 mg via INTRAVENOUS
  Filled 2023-07-10: qty 6

## 2023-07-10 NOTE — ED Provider Triage Note (Signed)
 Emergency Medicine Provider Triage Evaluation Note  Benjamin Woods , a 83 y.o. male  was evaluated in triage.  Pt complains of shortness of breath and abnormal kidney function.  History of CHF on Lasix has been taking his regular was has progressively worsening swelling, 4 pound weight gain, increased shortness of breath.  Seen at his cardiologist in [place] on Friday noted to have worsening renal function sent in for further evaluation..  Review of Systems  Positive: Shortness of breath and edema Negative: Chest pain  Physical Exam  BP 122/67 (BP Location: Left Arm)   Pulse (!) 58   Temp 97.7 F (36.5 C)   Resp 18   Ht 6' (1.829 m)   Wt 111.1 kg   SpO2 94%   BMI 33.23 kg/m  Gen:   Awake, no distress   Resp:  Normal effort  MSK:   Moves extremities without difficulty  Other:  Bilateral lower extremity pitting edema, crackles in the bases of the lungs  Medical Decision Making  Medically screening exam initiated at 4:52 PM.  Appropriate orders placed.  Benjamin Woods was informed that the remainder of the evaluation will be completed by another provider, this initial triage assessment does not replace that evaluation, and the importance of remaining in the ED until their evaluation is complete.     Arthor Captain, PA-C 07/10/23 [phone]

## 2023-07-10 NOTE — ED Provider Notes (Signed)
 [facility] Provider Note   CSN: [id number] Arrival date & time: 07/10/23  1639     History  Chief Complaint  Patient presents with   Shortness of Breath    Benjamin Woods is a 83 y.o. male.  Here with shortness of breath swelling in the legs increased weight gain.  History of heart failure.  Concern for heart failure exacerbation and brought here.  Shortness of breath with exertion.  No chest pain.  Increased swelling in the legs and weight gain despite taking Lasix.  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  Denies any chest pain or miss any doses of medication.  Nothing makes it worse or better.  The history is provided by the patient.       Home Medications Prior to Admission medications   Medication Sig Start Date End Date Taking? Authorizing Provider  amLODipine (NORVASC) 10 MG tablet Take 10 mg by mouth daily. 06/07/22   [provider]  apixaban (ELIQUIS) 5 MG TABS tablet Take 5 mg by mouth 2 (two) times daily.    [provider]  atorvastatin (LIPITOR) 20 MG tablet Take 1 tablet (20 mg total) by mouth every evening. 01/14/23   Flossie Dibble, NP  carvedilol (COREG) 25 MG tablet Take 25 mg by mouth 2 (two) times daily with a meal.    [provider]  cetirizine (ZYRTEC) 10 MG tablet Take 10 mg by mouth daily. 05/04/21   [provider]  colchicine 0.6 MG tablet Take 0.6 mg by mouth daily.    [provider]  DEXILANT 60 MG capsule Take 1 capsule by mouth daily as needed (GERD). 03/14/19   [provider]  ENTRESTO 24-26 MG Take 1 tablet by mouth 2 (two) times daily. 01/13/19   [provider]  Febuxostat 80 MG TABS Take 80 mg by mouth daily. 12/19/19   [provider]  furosemide (LASIX) 20 MG tablet Take 1 tablet (20 mg total) by mouth daily. 02/17/19 01/14/23  Georgeanna Lea, MD  guaiFENesin (MUCINEX) 600 MG 12 hr tablet Take by mouth 2 (two) times daily.     [provider]  latanoprost (XALATAN) 0.005 % ophthalmic solution Place 1 drop into both eyes at bedtime. 06/13/22   [provider]  potassium chloride (KLOR-CON) 10 MEQ tablet Take 10 mEq by mouth daily. 06/07/22   [provider]  sildenafil (VIAGRA) 100 MG tablet Take 100 mg by mouth daily as needed for erectile dysfunction. One hour prior to sex 10/08/18   [provider]  tamsulosin (FLOMAX) 0.4 MG CAPS capsule Take 0.4 mg by mouth daily. 12/19/19   [provider]      Allergies    Patient has no known allergies.    Review of Systems   Review of Systems  Physical Exam Updated Vital Signs BP 122/69 (BP Location: Right Arm)   Pulse (!) 58   Temp 97.7 F (36.5 C)   Resp 19   Ht 6' (1.829 m)   Wt 111.1 kg   SpO2 92%   BMI 33.23 kg/m  Physical Exam Vitals and nursing note reviewed.  Constitutional:      General: He is not in acute distress.    Appearance: He is well-developed.  HENT:     Head: Normocephalic and atraumatic.     Mouth/Throat:     Mouth: Mucous membranes are moist.  Eyes:     Extraocular Movements: Extraocular movements intact.  Conjunctiva/sclera: Conjunctivae normal.     Pupils: Pupils are equal, round, and reactive to light.  Cardiovascular:     Rate and Rhythm: Normal rate and regular rhythm.     Pulses: Normal pulses.     Heart sounds: Normal heart sounds. No murmur heard. Pulmonary:     Effort: Tachypnea present. No respiratory distress.     Breath sounds: Decreased breath sounds present.  Abdominal:     Palpations: Abdomen is soft.     Tenderness: There is no abdominal tenderness.  Musculoskeletal:        General: No swelling.     Cervical back: Normal range of motion and neck supple.     Right lower leg: Edema present.     Left lower leg: Edema present.  Skin:    General: Skin is warm and dry.     Capillary Refill: Capillary refill takes less than 2 seconds.  Neurological:     Mental Status: He  is alert.  Psychiatric:        Mood and Affect: Mood normal.     ED Results / Procedures / Treatments   Labs (all labs ordered are listed, but only abnormal results are displayed) Labs Reviewed  BASIC METABOLIC PANEL - Abnormal; Notable for the following components:      Result Value   Sodium 134 (*)    Glucose, Bld 107 (*)    BUN 27 (*)    Creatinine, Ser 1.42 (*)    GFR, Estimated 49 (*)    Anion gap 4 (*)    All other components within normal limits  CBC WITH DIFFERENTIAL/PLATELET - Abnormal; Notable for the following components:   Hemoglobin 12.0 (*)    Platelets 109 (*)    All other components within normal limits  BRAIN NATRIURETIC PEPTIDE - Abnormal; Notable for the following components:   B Natriuretic Peptide 426.6 (*)    All other components within normal limits  HEPATIC FUNCTION PANEL - Abnormal; Notable for the following components:   Total Protein 8.2 (*)    Albumin 3.3 (*)    Alkaline Phosphatase 143 (*)    All other components within normal limits  TROPONIN I (HIGH SENSITIVITY)  TROPONIN I (HIGH SENSITIVITY)    EKG EKG Interpretation Date/Time:  Wednesday July 10 2023 16:59:15 EST Ventricular Rate:  57 PR Interval:  318 QRS Duration:  92 QT Interval:  454 QTC Calculation: 441 R Axis:   -8  Text Interpretation: Sinus bradycardia with 1st degree A-V block Otherwise normal ECG When compared with ECG of 14-Jan-2023 15:16, PREVIOUS ECG IS PRESENT Confirmed by Virgina Norfolk [phone]) on 07/10/2023 5:03:40 PM  Radiology No results found.  Procedures Procedures    Medications Ordered in ED Medications  furosemide (LASIX) injection 60 mg (has no administration in time range)    ED Course/ Medical Decision Making/ A&P                                 Medical Decision Making Amount and/or Complexity of Data Reviewed Labs: ordered.  Risk Prescription drug management. Decision regarding hospitalization.   Benjamin Woods is here with redness  of breath leg swelling history of PE on Eliquis history of heart failure.  He has had weight gain shortness of breath with exertion but no chest pain.  He looks volume overloaded on exam.  Pitting edema bilaterally in his legs.  Hypoxic when he ambulates in the  room but recovers at rest.  Looks like he has some crackles in his lungs as well.  Differential diagnosis likely volume overload from heart failure exacerbation seems less likely to be ACS or infectious process.  Will get CBC troponin BNP chest x-ray BNP.  Overall lab work shows mild elevation in BNP but chest x-ray looks like right-sided pleural effusion consistent with volume overload given his clinical scenario.  Will give IV Lasix.  Kidney functions at baseline.  EKG per my review interpretation shows sinus rhythm.  I have no concern for ACS or PE.  I do think that this is volume overload and he benefit from IV diuresis and may be a thoracentesis in the morning.  Will admit to medicine for further care.  This chart was dictated using voice recognition software.  Despite best efforts to proofread,  errors can occur which can change the documentation meaning.         Final Clinical Impression(s) / ED Diagnoses Final diagnoses:  Acute on chronic congestive heart failure, unspecified heart failure type (HCC)  Pleural effusion    Rx / DC Orders ED Discharge Orders     None         Virgina Norfolk, DO 07/10/23 2008

## 2023-07-10 NOTE — Telephone Encounter (Signed)
 Pt c/o swelling/edema: STAT if pt has developed SOB within 24 hours  If swelling, where is the swelling located? Legs, feet, and face   How much weight have you gained and in what time span?   Today 245 lbs 07/07/23: 242 lbs  07/05/23: 239lbs   Have you gained 2 pounds in a day or 5 pounds in a week? 3 lbs in a week   Do you have a log of your daily weights (if so, list)? See above   Are you currently taking a fluid pill? Yes   Are you currently SOB? Yes   Have you traveled recently in a car or plane for an extended period of time? No

## 2023-07-10 NOTE — ED Notes (Signed)
 Called bed placement , no current beds at the moment. Called @ 22:19

## 2023-07-10 NOTE — Telephone Encounter (Addendum)
 Spoke to daughter, Camelia Eng per [place]. Terri reports that the patient has swelling in both legs and feet. She reports that he is short of breath when moving around. Blood pressure is 123/67. Patient and daughter report that he has had almost 4 pound weight gain since Monday. Advised Terri I would speak with DR. Revankar (DOD). Dr. Tomie China recommends for patient to go to the emergency room for evaluation. Spoke with Terri and reviewed Dr. Tomie China recommendation of patient going to the Emergency room. Daughter stated that they would go to the Emergency Room right now. She had no further questions.

## 2023-07-10 NOTE — ED Triage Notes (Signed)
 C/o SHOB x 2 days along w/ swelling in bilateral legs and feet. Hx of CHF. Takes lasix;

## 2023-07-11 ENCOUNTER — Other Ambulatory Visit: Payer: Self-pay

## 2023-07-11 DIAGNOSIS — N390 Urinary tract infection, site not specified: Secondary | ICD-10-CM | POA: Diagnosis not present

## 2023-07-11 DIAGNOSIS — Z87891 Personal history of nicotine dependence: Secondary | ICD-10-CM | POA: Diagnosis not present

## 2023-07-11 DIAGNOSIS — Z7901 Long term (current) use of anticoagulants: Secondary | ICD-10-CM | POA: Diagnosis not present

## 2023-07-11 DIAGNOSIS — R0602 Shortness of breath: Secondary | ICD-10-CM | POA: Diagnosis not present

## 2023-07-11 DIAGNOSIS — N1831 Chronic kidney disease, stage 3a: Secondary | ICD-10-CM | POA: Diagnosis not present

## 2023-07-11 DIAGNOSIS — D61818 Other pancytopenia: Secondary | ICD-10-CM | POA: Diagnosis not present

## 2023-07-11 DIAGNOSIS — I5043 Acute on chronic combined systolic (congestive) and diastolic (congestive) heart failure: Secondary | ICD-10-CM | POA: Diagnosis not present

## 2023-07-11 DIAGNOSIS — R001 Bradycardia, unspecified: Secondary | ICD-10-CM | POA: Insufficient documentation

## 2023-07-11 DIAGNOSIS — J984 Other disorders of lung: Secondary | ICD-10-CM | POA: Diagnosis not present

## 2023-07-11 DIAGNOSIS — I509 Heart failure, unspecified: Secondary | ICD-10-CM | POA: Insufficient documentation

## 2023-07-11 DIAGNOSIS — I5033 Acute on chronic diastolic (congestive) heart failure: Secondary | ICD-10-CM | POA: Diagnosis not present

## 2023-07-11 DIAGNOSIS — E1122 Type 2 diabetes mellitus with diabetic chronic kidney disease: Secondary | ICD-10-CM | POA: Diagnosis not present

## 2023-07-11 DIAGNOSIS — N3090 Cystitis, unspecified without hematuria: Secondary | ICD-10-CM | POA: Insufficient documentation

## 2023-07-11 DIAGNOSIS — Z8673 Personal history of transient ischemic attack (TIA), and cerebral infarction without residual deficits: Secondary | ICD-10-CM | POA: Diagnosis not present

## 2023-07-11 DIAGNOSIS — J9811 Atelectasis: Secondary | ICD-10-CM | POA: Diagnosis not present

## 2023-07-11 DIAGNOSIS — I77810 Thoracic aortic ectasia: Secondary | ICD-10-CM | POA: Diagnosis not present

## 2023-07-11 DIAGNOSIS — R0989 Other specified symptoms and signs involving the circulatory and respiratory systems: Secondary | ICD-10-CM | POA: Diagnosis not present

## 2023-07-11 DIAGNOSIS — I5021 Acute systolic (congestive) heart failure: Secondary | ICD-10-CM

## 2023-07-11 DIAGNOSIS — J90 Pleural effusion, not elsewhere classified: Secondary | ICD-10-CM | POA: Diagnosis present

## 2023-07-11 DIAGNOSIS — Z86718 Personal history of other venous thrombosis and embolism: Secondary | ICD-10-CM | POA: Diagnosis not present

## 2023-07-11 DIAGNOSIS — I130 Hypertensive heart and chronic kidney disease with heart failure and stage 1 through stage 4 chronic kidney disease, or unspecified chronic kidney disease: Secondary | ICD-10-CM | POA: Diagnosis not present

## 2023-07-11 DIAGNOSIS — J9601 Acute respiratory failure with hypoxia: Secondary | ICD-10-CM | POA: Diagnosis not present

## 2023-07-11 DIAGNOSIS — R54 Age-related physical debility: Secondary | ICD-10-CM | POA: Diagnosis present

## 2023-07-11 DIAGNOSIS — Z79899 Other long term (current) drug therapy: Secondary | ICD-10-CM | POA: Diagnosis not present

## 2023-07-11 DIAGNOSIS — I10 Essential (primary) hypertension: Secondary | ICD-10-CM | POA: Diagnosis not present

## 2023-07-11 DIAGNOSIS — E669 Obesity, unspecified: Secondary | ICD-10-CM | POA: Diagnosis present

## 2023-07-11 DIAGNOSIS — N401 Enlarged prostate with lower urinary tract symptoms: Secondary | ICD-10-CM | POA: Diagnosis not present

## 2023-07-11 DIAGNOSIS — I441 Atrioventricular block, second degree: Secondary | ICD-10-CM | POA: Diagnosis not present

## 2023-07-11 DIAGNOSIS — Z1152 Encounter for screening for COVID-19: Secondary | ICD-10-CM | POA: Diagnosis not present

## 2023-07-11 DIAGNOSIS — M109 Gout, unspecified: Secondary | ICD-10-CM | POA: Diagnosis present

## 2023-07-11 DIAGNOSIS — I824Z2 Acute embolism and thrombosis of unspecified deep veins of left distal lower extremity: Secondary | ICD-10-CM | POA: Diagnosis not present

## 2023-07-11 DIAGNOSIS — E785 Hyperlipidemia, unspecified: Secondary | ICD-10-CM | POA: Diagnosis present

## 2023-07-11 DIAGNOSIS — N289 Disorder of kidney and ureter, unspecified: Secondary | ICD-10-CM | POA: Insufficient documentation

## 2023-07-11 DIAGNOSIS — N179 Acute kidney failure, unspecified: Secondary | ICD-10-CM | POA: Diagnosis not present

## 2023-07-11 DIAGNOSIS — R918 Other nonspecific abnormal finding of lung field: Secondary | ICD-10-CM | POA: Diagnosis not present

## 2023-07-11 DIAGNOSIS — R609 Edema, unspecified: Secondary | ICD-10-CM | POA: Diagnosis not present

## 2023-07-11 DIAGNOSIS — N400 Enlarged prostate without lower urinary tract symptoms: Secondary | ICD-10-CM | POA: Diagnosis present

## 2023-07-11 DIAGNOSIS — Z6831 Body mass index (BMI) 31.0-31.9, adult: Secondary | ICD-10-CM | POA: Diagnosis not present

## 2023-07-11 DIAGNOSIS — Z86711 Personal history of pulmonary embolism: Secondary | ICD-10-CM | POA: Diagnosis not present

## 2023-07-11 DIAGNOSIS — K219 Gastro-esophageal reflux disease without esophagitis: Secondary | ICD-10-CM | POA: Diagnosis not present

## 2023-07-11 LAB — CBC WITH DIFFERENTIAL/PLATELET
Abs Immature Granulocytes: 0.01 10*3/uL (ref 0.00–0.07)
Basophils Absolute: 0 10*3/uL (ref 0.0–0.1)
Basophils Relative: 1 %
Eosinophils Absolute: 0.2 10*3/uL (ref 0.0–0.5)
Eosinophils Relative: 5 %
HCT: 38 % — ABNORMAL LOW (ref 39.0–52.0)
Hemoglobin: 11.9 g/dL — ABNORMAL LOW (ref 13.0–17.0)
Immature Granulocytes: 0 %
Lymphocytes Relative: 35 %
Lymphs Abs: 1.2 10*3/uL (ref 0.7–4.0)
MCH: 27 pg (ref 26.0–34.0)
MCHC: 31.3 g/dL (ref 30.0–36.0)
MCV: 86.2 fL (ref 80.0–100.0)
Monocytes Absolute: 0.4 10*3/uL (ref 0.1–1.0)
Monocytes Relative: 13 %
Neutro Abs: 1.5 10*3/uL — ABNORMAL LOW (ref 1.7–7.7)
Neutrophils Relative %: 46 %
Platelets: 99 10*3/uL — ABNORMAL LOW (ref 150–400)
RBC: 4.41 MIL/uL (ref 4.22–5.81)
RDW: 15.6 % — ABNORMAL HIGH (ref 11.5–15.5)
WBC: 3.3 10*3/uL — ABNORMAL LOW (ref 4.0–10.5)
nRBC: 0 % (ref 0.0–0.2)

## 2023-07-11 LAB — COMPREHENSIVE METABOLIC PANEL
ALT: 26 U/L (ref 0–44)
AST: 29 U/L (ref 15–41)
Albumin: 3.1 g/dL — ABNORMAL LOW (ref 3.5–5.0)
Alkaline Phosphatase: 143 U/L — ABNORMAL HIGH (ref 38–126)
Anion gap: 6 (ref 5–15)
BUN: 25 mg/dL — ABNORMAL HIGH (ref 8–23)
CO2: 27 mmol/L (ref 22–32)
Calcium: 8.7 mg/dL — ABNORMAL LOW (ref 8.9–10.3)
Chloride: 103 mmol/L (ref 98–111)
Creatinine, Ser: 1.25 mg/dL — ABNORMAL HIGH (ref 0.61–1.24)
GFR, Estimated: 57 mL/min — ABNORMAL LOW (ref >60)
Glucose, Bld: 162 mg/dL — ABNORMAL HIGH (ref 70–99)
Potassium: 4 mmol/L (ref 3.5–5.1)
Sodium: 136 mmol/L (ref 135–145)
Total Bilirubin: 0.5 mg/dL (ref 0.0–1.2)
Total Protein: 7.6 g/dL (ref 6.5–8.1)

## 2023-07-11 LAB — URINALYSIS, W/ REFLEX TO CULTURE (INFECTION SUSPECTED)
Bilirubin Urine: NEGATIVE
Glucose, UA: NEGATIVE mg/dL
Hgb urine dipstick: NEGATIVE
Ketones, ur: NEGATIVE mg/dL
Nitrite: POSITIVE — AB
Protein, ur: NEGATIVE mg/dL
Specific Gravity, Urine: 1.01 (ref 1.005–1.030)
pH: 5.5 (ref 5.0–8.0)

## 2023-07-11 LAB — TSH: TSH: 4.335 u[IU]/mL (ref 0.350–4.500)

## 2023-07-11 LAB — HEMOGLOBIN A1C
Hgb A1c MFr Bld: 6.6 % — ABNORMAL HIGH (ref 4.8–5.6)
Mean Plasma Glucose: 142.72 mg/dL

## 2023-07-11 LAB — RESP PANEL BY RT-PCR (RSV, FLU A&B, COVID)  RVPGX2
Influenza A by PCR: NEGATIVE
Influenza B by PCR: NEGATIVE
Resp Syncytial Virus by PCR: NEGATIVE
SARS Coronavirus 2 by RT PCR: NEGATIVE

## 2023-07-11 LAB — MAGNESIUM: Magnesium: 1.8 mg/dL (ref 1.7–2.4)

## 2023-07-11 LAB — LACTIC ACID, PLASMA: Lactic Acid, Venous: 1 mmol/L (ref 0.5–1.9)

## 2023-07-11 LAB — VITAMIN B12: Vitamin B-12: 1115 pg/mL — ABNORMAL HIGH (ref 180–914)

## 2023-07-11 LAB — T4, FREE: Free T4: 0.87 ng/dL (ref 0.61–1.12)

## 2023-07-11 LAB — ETHANOL: Alcohol, Ethyl (B): <10 mg/dL (ref <10)

## 2023-07-11 MED ORDER — ACETAMINOPHEN 325 MG PO TABS: 650.0000 mg | ORAL_TABLET | Freq: Four times a day (QID) | ORAL | Status: DC | PRN

## 2023-07-11 MED ORDER — APIXABAN 5 MG PO TABS
5.0000 mg | ORAL_TABLET | Freq: Two times a day (BID) | ORAL | Status: DC
  Administered 2023-07-11 – 2023-07-16 (×11): 5 mg via ORAL
  Filled 2023-07-11 (×7): qty 1
  Filled 2023-07-11: qty 2
  Filled 2023-07-11 (×3): qty 1

## 2023-07-11 MED ORDER — VANCOMYCIN HCL IN DEXTROSE 1-5 GM/200ML-% IV SOLN
1000.0000 mg | Freq: Once | INTRAVENOUS | Status: AC
  Administered 2023-07-11: 1000 mg via INTRAVENOUS
  Filled 2023-07-11: qty 200

## 2023-07-11 MED ORDER — FUROSEMIDE 10 MG/ML IJ SOLN
20.0000 mg | Freq: Three times a day (TID) | INTRAMUSCULAR | Status: DC
  Administered 2023-07-11: 20 mg via INTRAVENOUS
  Filled 2023-07-11: qty 2

## 2023-07-11 MED ORDER — ALLOPURINOL 300 MG PO TABS
300.0000 mg | ORAL_TABLET | Freq: Every day | ORAL | Status: DC
  Administered 2023-07-11 – 2023-07-16 (×6): 300 mg via ORAL
  Filled 2023-07-11 (×6): qty 1

## 2023-07-11 MED ORDER — TAMSULOSIN HCL 0.4 MG PO CAPS
0.4000 mg | ORAL_CAPSULE | Freq: Two times a day (BID) | ORAL | Status: DC
  Administered 2023-07-11 – 2023-07-16 (×10): 0.4 mg via ORAL
  Filled 2023-07-11 (×10): qty 1

## 2023-07-11 MED ORDER — FEBUXOSTAT 40 MG PO TABS
80.0000 mg | ORAL_TABLET | Freq: Every day | ORAL | Status: DC
  Administered 2023-07-11 – 2023-07-16 (×6): 80 mg via ORAL
  Filled 2023-07-11 (×8): qty 2

## 2023-07-11 MED ORDER — MORPHINE SULFATE (PF) 2 MG/ML IV SOLN: 2.0000 mg | INTRAVENOUS | Status: DC | PRN

## 2023-07-11 MED ORDER — METRONIDAZOLE 500 MG/100ML IV SOLN
500.0000 mg | Freq: Two times a day (BID) | INTRAVENOUS | Status: DC
Start: 2023-07-11 — End: 2023-07-11

## 2023-07-11 MED ORDER — SODIUM CHLORIDE 0.9% FLUSH
3.0000 mL | INTRAVENOUS | Status: DC | PRN
  Administered 2023-07-12: 3 mL via INTRAVENOUS

## 2023-07-11 MED ORDER — SODIUM CHLORIDE 0.9% FLUSH
3.0000 mL | Freq: Two times a day (BID) | INTRAVENOUS | Status: DC
  Administered 2023-07-11 – 2023-07-16 (×6): 3 mL via INTRAVENOUS

## 2023-07-11 MED ORDER — VANCOMYCIN HCL 1250 MG/250ML IV SOLN
1250.0000 mg | INTRAVENOUS | Status: DC
  Administered 2023-07-12: 1250 mg via INTRAVENOUS
  Filled 2023-07-11: qty 250

## 2023-07-11 MED ORDER — HYDRALAZINE HCL 20 MG/ML IJ SOLN: 5.0000 mg | INTRAMUSCULAR | Status: DC | PRN

## 2023-07-11 MED ORDER — FUROSEMIDE 10 MG/ML IJ SOLN
40.0000 mg | Freq: Two times a day (BID) | INTRAMUSCULAR | Status: DC
  Administered 2023-07-11 – 2023-07-13 (×4): 40 mg via INTRAVENOUS
  Filled 2023-07-11 (×4): qty 4

## 2023-07-11 MED ORDER — SODIUM CHLORIDE 0.9 % IV SOLN
2.0000 g | Freq: Once | INTRAVENOUS | Status: AC
  Administered 2023-07-11: 2 g via INTRAVENOUS
  Filled 2023-07-11: qty 12.5

## 2023-07-11 MED ORDER — APIXABAN 5 MG PO TABS: 5.0000 mg | ORAL_TABLET | Freq: Two times a day (BID) | ORAL | Status: DC

## 2023-07-11 MED ORDER — APIXABAN 2.5 MG PO TABS
5.0000 mg | ORAL_TABLET | Freq: Once | ORAL | Status: AC
  Administered 2023-07-11: 5 mg via ORAL
  Filled 2023-07-11: qty 2

## 2023-07-11 MED ORDER — LATANOPROST 0.005 % OP SOLN
1.0000 [drp] | Freq: Every day | OPHTHALMIC | Status: DC
Start: 2023-07-11 — End: 2023-07-16
  Administered 2023-07-11 – 2023-07-15 (×5): 1 [drp] via OPHTHALMIC
  Filled 2023-07-11: qty 2.5

## 2023-07-11 MED ORDER — SODIUM CHLORIDE 0.9% FLUSH
3.0000 mL | Freq: Two times a day (BID) | INTRAVENOUS | Status: DC
  Administered 2023-07-11 – 2023-07-16 (×10): 3 mL via INTRAVENOUS

## 2023-07-11 MED ORDER — METRONIDAZOLE 500 MG PO TABS
500.0000 mg | ORAL_TABLET | Freq: Two times a day (BID) | ORAL | Status: DC
  Administered 2023-07-11 – 2023-07-12 (×2): 500 mg via ORAL
  Filled 2023-07-11 (×2): qty 1

## 2023-07-11 MED ORDER — SODIUM CHLORIDE 0.9 % IV SOLN: 250.0000 mL | INTRAVENOUS | Status: DC | PRN

## 2023-07-11 MED ORDER — SODIUM CHLORIDE 0.9 % IV SOLN
2.0000 g | Freq: Two times a day (BID) | INTRAVENOUS | Status: DC
  Administered 2023-07-11 – 2023-07-12 (×2): 2 g via INTRAVENOUS
  Filled 2023-07-11 (×2): qty 12.5

## 2023-07-11 MED ORDER — THIAMINE MONONITRATE 100 MG PO TABS
100.0000 mg | ORAL_TABLET | Freq: Every day | ORAL | Status: DC
  Administered 2023-07-11 – 2023-07-16 (×6): 100 mg via ORAL
  Filled 2023-07-11 (×6): qty 1

## 2023-07-11 MED ORDER — ACETAMINOPHEN 650 MG RE SUPP: 650.0000 mg | Freq: Four times a day (QID) | RECTAL | Status: DC | PRN

## 2023-07-11 MED ORDER — ATORVASTATIN CALCIUM 10 MG PO TABS
20.0000 mg | ORAL_TABLET | Freq: Every evening | ORAL | Status: DC
  Administered 2023-07-11 – 2023-07-15 (×5): 20 mg via ORAL
  Filled 2023-07-11 (×5): qty 2

## 2023-07-11 MED ORDER — METRONIDAZOLE 500 MG/100ML IV SOLN
500.0000 mg | Freq: Once | INTRAVENOUS | Status: AC
  Administered 2023-07-11: 500 mg via INTRAVENOUS
  Filled 2023-07-11: qty 100

## 2023-07-11 NOTE — Progress Notes (Signed)
 Updated daughter via phone per pt request

## 2023-07-11 NOTE — Sepsis Progress Note (Signed)
 Sepsis protocol monitored by [facility] ?

## 2023-07-11 NOTE — ED Notes (Signed)
 Called [facility] to transport patient to [facility] [facility]

## 2023-07-11 NOTE — ED Notes (Signed)
 [facility] into transport

## 2023-07-11 NOTE — H&P (Signed)
 History and Physical    Patient: Benjamin Woods:[id number] DOB: 01-07-1941 DOA: 07/10/2023 DOS: the patient was seen and examined on 07/11/2023 PCP: [name], MD  Patient coming from:  [facility] Chief complaint: Chief Complaint  Patient presents with   Shortness of Breath   HPI:  Benjamin Woods is a 83 y.o. 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  Patient does not report any chest pain nausea vomiting falls headache speech or gait issues.  Patient states the swelling has been getting worse over the past 1 year.  At [facility] patient's EKG was noted to be abnormal with a ventricular rate of 57 and a PR of 318 QTc of 441.  Patient was started on oxygen at 1 L with O2 sats going to 94 to 95%.  While awaiting transfer to [facility] patient was noted to be hypothermic with a temperature of 94.5 with multiple warming blankets, patient was placed on a Bair hugger, cultures were obtained, antibiotics were initiated there.  EDMD from [facility] contacted Dr. Katrinka Blazing with [facility] and upgraded bed assignment to progressive bed with concerns of possible sepsis.  At that time cardiology was contacted and on-call cardiology recommended atropine if patient.  At [facility] patient received Lasix 60 mg IV x 1.  >>ED Course: In emergency room  Vitals:   07/11/23 1145 07/11/23 1350 07/11/23 1556 07/11/23 1658  BP:  125/68    Pulse: (!) 59 62  60  Temp: (!) 95.3 F (35.2 C) (!) 97.5 F (36.4 C)  (!) 96.4 F (35.8 C)  Resp: 17 16    Height:  6' (1.829 m)    Weight:  105.6 kg    SpO2: 94% 97% 100% 95%  TempSrc:  Oral  Axillary  BMI (Calculated):   31.57    ED evaluation  so far shows: CMP showing sodium 134, glucose 107, BUN of 27 creatinine 1.42, alk phos 143, normal AST ALT. Repeat EKG is pending. BNP of 426.6. Troponin of 10 x 2. Lactic acid of 1.0. A1c added with hyperglycemia noted on metabolic panels shows 6.6. CBC shows white count of 3.3 hemoglobin of 11.9 platelet count of 99. Viral panel negative for flu RSV and COVID. Marland Kitchen  Urinalysis is abnormal with positive nitrite small leukocytes and cloudy urine with 0-5 WBCs   In the emergency room  pt has received the following treatment thus far: Medications  apixaban (ELIQUIS) tablet 5 mg (5 mg Oral Given 07/11/23 1143)  furosemide (LASIX) injection 20 mg (20 mg Intravenous Given 07/11/23 1525)  sodium chloride flush (NS) 0.9 % injection 3 mL (has no administration in time range)  acetaminophen (TYLENOL) tablet 650 mg (has no administration in time range)    Or  acetaminophen (TYLENOL) suppository 650 mg (has no administration in time range)  morphine (PF) 2 MG/ML injection 2 mg (has no administration in time range)  hydrALAZINE (APRESOLINE) injection 5 mg (has no administration in time range)  sodium chloride flush (NS) 0.9 % injection 3 mL (has no administration in time range)  sodium chloride flush (NS) 0.9 % injection 3 mL (has no administration in time range)  metroNIDAZOLE (FLAGYL) tablet 500 mg (  has no administration in time range)  allopurinol (ZYLOPRIM) tablet 300 mg (has no administration in time range)  apixaban (ELIQUIS) tablet 5 mg (has no administration in time range)  atorvastatin (LIPITOR) tablet 20 mg (has no administration in time range)  Febuxostat TABS 80 mg (has no administration in time range)  latanoprost (XALATAN) 0.005 % ophthalmic solution 1 drop (has no administration in time range)  tamsulosin (FLOMAX) capsule 0.4 mg (has no administration in time range)  thiamine (VITAMIN B1) tablet 100 mg (has no administration in time range)  furosemide (LASIX)  injection 60 mg (60 mg Intravenous Given 07/10/23 2112)  apixaban (ELIQUIS) tablet 5 mg (5 mg Oral Given 07/11/23 0145)  ceFEPIme (MAXIPIME) 2 g in sodium chloride 0.9 % 100 mL IVPB (0 g Intravenous Stopped 07/11/23 1236)  metroNIDAZOLE (FLAGYL) IVPB 500 mg (0 mg Intravenous Stopped 07/11/23 1403)  vancomycin (VANCOCIN) IVPB 1000 mg/200 mL premix (0 mg Intravenous Stopped 07/11/23 1403)  vancomycin (VANCOCIN) IVPB 1000 mg/200 mL premix (0 mg Intravenous Stopped 07/11/23 1801)     Review of Systems  Cardiovascular:  Positive for leg swelling.  Musculoskeletal:        Both hands have deformities from gouty arthritis.    Past Medical History:  Diagnosis Date   Acute combined systolic and diastolic congestive heart failure (HCC) 12/10/2018   Acute combined systolic and diastolic heart failure (HCC)    BPH (benign prostatic hyperplasia)    Cardiomyopathy (HCC)    DVT (deep venous thrombosis) (HCC) 12/10/2018   Elevated troponin 12/10/2018   GERD (gastroesophageal reflux disease)    Gout    History of pulmonary embolism 12/22/2019   HTN (hypertension)    Hypocalcemia    Hypokalemia    Pressure injury of skin 12/11/2018   Protein-calorie malnutrition, moderate (HCC) 12/10/2018   Pulmonary embolism (HCC)    Stroke (cerebrum) (HCC)    Syncope    Systolic congestive heart failure (HCC) 01/30/2019   Past Surgical History:  Procedure Laterality Date   HAND SURGERY      reports that he has quit smoking. He has never used smokeless tobacco. He reports that he does not currently use alcohol. He reports that he does not use drugs.  No Known Allergies  History reviewed. No pertinent family history.  Prior to Admission medications   Medication Sig Start Date End Date Taking? Authorizing Provider  amLODipine (NORVASC) 10 MG tablet Take 10 mg by mouth daily. 06/07/22   [provider]  apixaban (ELIQUIS) 5 MG TABS tablet Take 5 mg by mouth 2 (two) times daily.    [provider]  atorvastatin  (LIPITOR) 20 MG tablet Take 1 tablet (20 mg total) by mouth every evening. 01/14/23   Flossie Dibble, NP  carvedilol (COREG) 25 MG tablet Take 25 mg by mouth 2 (two) times daily with a meal.    [provider]  cetirizine (ZYRTEC) 10 MG tablet Take 10 mg by mouth daily. 05/04/21   [provider]  colchicine 0.6 MG tablet Take 0.6 mg by mouth daily.    [provider]  DEXILANT 60 MG capsule Take 1 capsule by mouth daily as needed (GERD). 03/14/19   [provider]  ENTRESTO 24-26 MG Take 1 tablet by mouth 2 (two) times daily. 01/13/19   [provider]  Febuxostat 80 MG TABS Take 80 mg by mouth daily. 12/19/19   [provider]  furosemide (LASIX) 20 MG tablet Take 1 tablet (20 mg total) by mouth  daily. 02/17/19 01/14/23  Georgeanna Lea, MD  guaiFENesin (MUCINEX) 600 MG 12 hr tablet Take by mouth 2 (two) times daily.    [provider]  latanoprost (XALATAN) 0.005 % ophthalmic solution Place 1 drop into both eyes at bedtime. 06/13/22   [provider]  potassium chloride (KLOR-CON) 10 MEQ tablet Take 10 mEq by mouth daily. 06/07/22   [provider]  sildenafil (VIAGRA) 100 MG tablet Take 100 mg by mouth daily as needed for erectile dysfunction. One hour prior to sex 10/08/18   [provider]  tamsulosin (FLOMAX) 0.4 MG CAPS capsule Take 0.4 mg by mouth daily. 12/19/19   [provider]     Vitals:   07/11/23 1145 07/11/23 1350 07/11/23 1556 07/11/23 1658  BP:  125/68    Pulse: (!) 59 62  60  Resp: 17 16    Temp: (!) 95.3 F (35.2 C) (!) 97.5 F (36.4 C)  (!) 96.4 F (35.8 C)  TempSrc:  Oral  Axillary  SpO2: 94% 97% 100% 95%  Weight:  105.6 kg    Height:  6' (1.829 m)     Physical Exam Vitals and nursing note reviewed.  Constitutional:      General: He is not in acute distress.    Appearance: He is not ill-appearing.  HENT:     Head: Normocephalic and atraumatic.     Right Ear: Hearing  normal.     Left Ear: Hearing normal.     Nose: Nose normal. No nasal deformity.     Mouth/Throat:     Lips: Pink.     Tongue: No lesions.     Pharynx: Oropharynx is clear.  Eyes:     General: Lids are normal.     Extraocular Movements: Extraocular movements intact.  Cardiovascular:     Rate and Rhythm: Normal rate and regular rhythm.     Pulses:          Dorsalis pedis pulses are 1+ on the right side and 1+ on the left side.       Posterior tibial pulses are 1+ on the right side and 1+ on the left side.     Heart sounds: Normal heart sounds.  Pulmonary:     Effort: Pulmonary effort is normal.     Breath sounds: Normal breath sounds.  Abdominal:     General: Bowel sounds are normal. There is no distension.     Palpations: Abdomen is soft. There is no mass.     Tenderness: There is no abdominal tenderness.  Musculoskeletal:     Right lower leg: 2+ Edema present.     Left lower leg: 2+ Edema present.  Skin:    General: Skin is warm.  Neurological:     General: No focal deficit present.     Mental Status: He is alert and oriented to person, place, and time.     Cranial Nerves: Cranial nerves 2-12 are intact.  Psychiatric:        Attention and Perception: Attention normal.        Speech: Speech normal.        Behavior: Behavior is cooperative.      Labs on Admission: I have personally reviewed following labs and imaging studies Results for orders placed or performed during the hospital encounter of 07/10/23 (from the past 24 hours)  Troponin I (High Sensitivity)     Status: None   Collection Time: 07/10/23  8:30 PM  Result Value Ref Range  Troponin I (High Sensitivity) 10 <18 ng/L  Resp panel by RT-PCR (RSV, Flu A&B, Covid) Anterior Nasal Swab     Status: None   Collection Time: 07/11/23  8:56 AM   Specimen: Anterior Nasal Swab  Result Value Ref Range   SARS Coronavirus 2 by RT PCR NEGATIVE NEGATIVE   Influenza A by PCR NEGATIVE NEGATIVE   Influenza B by PCR NEGATIVE  NEGATIVE   Resp Syncytial Virus by PCR NEGATIVE NEGATIVE  Lactic acid, plasma     Status: None   Collection Time: 07/11/23 10:18 AM  Result Value Ref Range   Lactic Acid, Venous 1.0 0.5 - 1.9 mmol/L  Comprehensive metabolic panel     Status: Abnormal   Collection Time: 07/11/23 10:18 AM  Result Value Ref Range   Sodium 136 135 - 145 mmol/L   Potassium 4.0 3.5 - 5.1 mmol/L   Chloride 103 98 - 111 mmol/L   CO2 27 22 - 32 mmol/L   Glucose, Bld 162 (H) 70 - 99 mg/dL   BUN 25 (H) 8 - 23 mg/dL   Creatinine, Ser 9.14 (H) 0.61 - 1.24 mg/dL   Calcium 8.7 (L) 8.9 - 10.3 mg/dL   Total Protein 7.6 6.5 - 8.1 g/dL   Albumin 3.1 (L) 3.5 - 5.0 g/dL   AST 29 15 - 41 U/L   ALT 26 0 - 44 U/L   Alkaline Phosphatase 143 (H) 38 - 126 U/L   Total Bilirubin 0.5 0.0 - 1.2 mg/dL   GFR, Estimated 57 (L) >60 mL/min   Anion gap 6 5 - 15  CBC with Differential     Status: Abnormal   Collection Time: 07/11/23 10:18 AM  Result Value Ref Range   WBC 3.3 (L) 4.0 - 10.5 K/uL   RBC 4.41 4.22 - 5.81 MIL/uL   Hemoglobin 11.9 (L) 13.0 - 17.0 g/dL   HCT 78.2 (L) 95.6 - 21.3 %   MCV 86.2 80.0 - 100.0 fL   MCH 27.0 26.0 - 34.0 pg   MCHC 31.3 30.0 - 36.0 g/dL   RDW 08.6 (H) 57.8 - 46.9 %   Platelets 99 (L) 150 - 400 K/uL   nRBC 0.0 0.0 - 0.2 %   Neutrophils Relative % 46 %   Neutro Abs 1.5 (L) 1.7 - 7.7 K/uL   Lymphocytes Relative 35 %   Lymphs Abs 1.2 0.7 - 4.0 K/uL   Monocytes Relative 13 %   Monocytes Absolute 0.4 0.1 - 1.0 K/uL   Eosinophils Relative 5 %   Eosinophils Absolute 0.2 0.0 - 0.5 K/uL   Basophils Relative 1 %   Basophils Absolute 0.0 0.0 - 0.1 K/uL   Immature Granulocytes 0 %   Abs Immature Granulocytes 0.01 0.00 - 0.07 K/uL  Urinalysis, w/ Reflex to Culture (Infection Suspected) -Urine, Clean Catch     Status: Abnormal   Collection Time: 07/11/23 10:18 AM  Result Value Ref Range   Specimen Source URINE, CLEAN CATCH    Color, Urine YELLOW YELLOW   APPearance CLOUDY (A) CLEAR   Specific  Gravity, Urine 1.010 1.005 - 1.030   pH 5.5 5.0 - 8.0   Glucose, UA NEGATIVE NEGATIVE mg/dL   Hgb urine dipstick NEGATIVE NEGATIVE   Bilirubin Urine NEGATIVE NEGATIVE   Ketones, ur NEGATIVE NEGATIVE mg/dL   Protein, ur NEGATIVE NEGATIVE mg/dL   Nitrite POSITIVE (A) NEGATIVE   Leukocytes,Ua SMALL (A) NEGATIVE   Squamous Epithelial / HPF 0-5 0 - 5 /HPF   WBC, UA  0-5 0 - 5 WBC/hpf   RBC / HPF 0-5 0 - 5 RBC/hpf   Bacteria, UA MANY (A) NONE SEEN   WBC Clumps PRESENT   Hemoglobin A1c     Status: Abnormal   Collection Time: 07/11/23  6:27 PM  Result Value Ref Range   Hgb A1c MFr Bld 6.6 (H) 4.8 - 5.6 %   Mean Plasma Glucose 142.72 mg/dL   Recent Results (from the past 720 hours)  Resp panel by RT-PCR (RSV, Flu A&B, Covid) Anterior Nasal Swab     Status: None   Collection Time: 07/11/23  8:56 AM   Specimen: Anterior Nasal Swab  Result Value Ref Range Status   SARS Coronavirus 2 by RT PCR NEGATIVE NEGATIVE Final    Comment: (NOTE) SARS-CoV-2 target nucleic acids are NOT DETECTED.  The SARS-CoV-2 RNA is generally detectable in upper respiratory specimens during the acute phase of infection. The lowest concentration of SARS-CoV-2 viral copies this assay can detect is 138 copies/mL. A negative result does not preclude SARS-Cov-2 infection and should not be used as the sole basis for treatment or other patient management decisions. A negative result may occur with  improper specimen collection/handling, submission of specimen other than nasopharyngeal swab, presence of viral mutation(s) within the areas targeted by this assay, and inadequate number of viral copies(<138 copies/mL). A negative result must be combined with clinical observations, patient history, and epidemiological information. The expected result is Negative.  Fact Sheet for Patients:  BloggerCourse.com  Fact Sheet for Healthcare Providers:  SeriousBroker.it  This test  is no t yet approved or cleared by the [place] FDA and  has been authorized for detection and/or diagnosis of SARS-CoV-2 by FDA under an Emergency Use Authorization (EUA). This EUA will remain  in effect (meaning this test can be used) for the duration of the COVID-19 declaration under Section 564(b)(1) of the Act, 21 U.S.C.section 360bbb-3(b)(1), unless the authorization is terminated  or revoked sooner.       Influenza A by PCR NEGATIVE NEGATIVE Final   Influenza B by PCR NEGATIVE NEGATIVE Final    Comment: (NOTE) The Xpert Xpress SARS-CoV-2/FLU/RSV plus assay is intended as an aid in the diagnosis of influenza from Nasopharyngeal swab specimens and should not be used as a sole basis for treatment. Nasal washings and aspirates are unacceptable for Xpert Xpress SARS-CoV-2/FLU/RSV testing.  Fact Sheet for Patients: BloggerCourse.com  Fact Sheet for Healthcare Providers: SeriousBroker.it  This test is not yet approved or cleared by the [place] FDA and has been authorized for detection and/or diagnosis of SARS-CoV-2 by FDA under an Emergency Use Authorization (EUA). This EUA will remain in effect (meaning this test can be used) for the duration of the COVID-19 declaration under Section 564(b)(1) of the Act, 21 U.S.C. section 360bbb-3(b)(1), unless the authorization is terminated or revoked.     Resp Syncytial Virus by PCR NEGATIVE NEGATIVE Final    Comment: (NOTE) Fact Sheet for Patients: BloggerCourse.com  Fact Sheet for Healthcare Providers: SeriousBroker.it  This test is not yet approved or cleared by the [place] FDA and has been authorized for detection and/or diagnosis of SARS-CoV-2 by FDA under an Emergency Use Authorization (EUA). This EUA will remain in effect (meaning this test can be used) for the duration of the COVID-19 declaration under Section  564(b)(1) of the Act, 21 U.S.C. section 360bbb-3(b)(1), unless the authorization is terminated or revoked.  Performed at [facility], [address] [place], [place] [address]  CBC:    Latest Ref Rng & Units 07/11/2023   10:18 AM 07/10/2023    4:52 PM 12/13/2018    3:14 AM  CBC  WBC 4.0 - 10.5 K/uL 3.3  4.3  6.7   Hemoglobin 13.0 - 17.0 g/dL 19.1  47.8  29.5   Hematocrit 39.0 - 52.0 % 38.0  39.2  36.9   Platelets 150 - 400 K/uL 99  109  197    Basic Metabolic Panel: Recent Labs  Lab 07/10/23 1652 07/11/23 1018  NA 134* 136  K 4.8 4.0  CL 104 103  CO2 26 27  GLUCOSE 107* 162*  BUN 27* 25*  CREATININE 1.42* 1.25*  CALCIUM 9.0 8.7*   Creatinine: Lab Results  Component Value Date   CREATININE 1.25 (H) 07/11/2023   CREATININE 1.42 (H) 07/10/2023   CREATININE 1.44 (H) 12/29/2021   Liver Function Tests:    Latest Ref Rng & Units 07/11/2023   10:18 AM 07/10/2023    4:52 PM  Hepatic Function  Total Protein 6.5 - 8.1 g/dL 7.6  8.2   Albumin 3.5 - 5.0 g/dL 3.1  3.3   AST 15 - 41 U/L 29  29   ALT 0 - 44 U/L 26  27   Alk Phosphatase 38 - 126 U/L 143  143   Total Bilirubin 0.0 - 1.2 mg/dL 0.5  0.5   Bilirubin, Direct 0.0 - 0.2 mg/dL  0.1    Coagulation Profile: No results for input(s): "INR", "PROTIME" in the last 168 hours. Cardiac Enzymes: No results for input(s): "CKTOTAL", "CKMB", "CKMBINDEX", "TROPONINI" in the last 168 hours. BNP (last 3 results) No results for input(s): "PROBNP" in the last 8760 hours. HbA1C: Recent Labs    07/11/23 1827  HGBA1C 6.6*   Lipid Profile: No results for input(s): "CHOL", "HDL", "LDLCALC", "TRIG", "CHOLHDL", "LDLDIRECT" in the last 72 hours.  Radiological Exams on Admission: DG Chest 2 View Result Date: 07/10/2023 CLINICAL DATA:  Shortness of breath and bilateral foot swelling x2 days. EXAM: CHEST - 2 VIEW COMPARISON:  August 14, 2019 FINDINGS: The heart size and mediastinal contours are within normal limits. There is  moderate to marked severity calcification of the aortic arch. Low lung volumes are seen with mild, stable elevation of the right hemidiaphragm. Mild, diffusely increased interstitial lung markings are present. Mild right infrahilar scarring and/or atelectasis is also noted. No pleural effusion or pneumothorax is identified. Multilevel degenerative changes are seen throughout the thoracic spine. IMPRESSION: 1. Low lung volumes with mild, diffusely increased interstitial lung markings which may represent mild interstitial edema. 2. Mild right infrahilar scarring and/or atelectasis. Electronically Signed   By: Aram Candela M.D.   On: 07/10/2023 20:16    Data Reviewed: Relevant notes from primary care and specialist visits, past discharge summaries as available in EHR, including Care Everywhere. Prior diagnostic testing as pertinent to current admission diagnoses, Updated medications and problem lists for reconciliation ED course, including vitals, labs, imaging, treatment and response to treatment,Triage notes, nursing and pharmacy notes and ED provider's notes Notable results as noted in HPI.Discussed case with EDMD/ ED APP/ or Specialty MD on call and as needed.  Assessment and Plan: >>Acute on chronic combined system like and diastolic congestive heart failure: Admit to cardiac telemetry unit with continuous pulse oximetry. Daily weight strict I's and O's. Aspiration and fall precautions. 1800 mg sodium restricted diet. Yesterday patient received Lasix 60 mg IV x 1. Cautious diuresis with Lasix 20 mg IV 3 times daily  for 3 doses Home meds include Coreg 25 twice daily, Entresto 24-26, amlodipine 10 mg. Today we will hold patient's amlodipine and Entresto. Continue patient on Lasix 20 mg IV 3 times daily x 1 day. Cardiology consulted and 2D echo ordered.   >> Abnormal EKG with a prolonged PR interval: Will check magnesium and TFTs. Ethanol level. Cardiology consulted. Repeat EKG ordered  and pending.   >> Hypothermia/ sepsis: Differentials include sepsis related, hypoperfusion related. Patient's temperature currently has improved, will discontinue the [employer] Lactic acid is within normal limits. Blood and urine cultures collected we will continue antibiotics.  >>Sepsis/ UTI: Patient continued on broad-spectrum antibiotics that were started in outside facility with vancomycin cefepime and Flagyl. Additional supportive measures with IV fluids being restricted due to CHF. Antipyretics antiemetics. Follow culture sensitivity.   >> BPH: Continue Flomax.   >> Decreased pedal pulses: ABIs. Neurovascular checks.   >> New onset diabetes mellitus type 2: A1c 6.6. Patient not aware will defer to a.m. team to discuss new diagnosis. Diabetic teaching. Dietitian consult.  >>Pancytopenia: New unclear if patient with thrombocytopenia is possible from underlying alcohol abuse. Will follow CBC and outpatient hematology consult, transfusion if indicated. TFTs, B12, ethanol level.  >>Gout: Continue allopurinol and uloric.  >> History of PE/DVT: Continue Eliquis.      DVT prophylaxis:  Eliquis Consults:  Cardiology Advance Care Planning:    Code Status: Full Code   Family Communication:  None Disposition Plan:  Home Severity of Illness: The appropriate patient status for this patient is OBSERVATION. Observation status is judged to be reasonable and necessary in order to provide the required intensity of service to ensure the patient's safety. The patient's presenting symptoms, physical exam findings, and initial radiographic and laboratory data in the context of their medical condition is felt to place them at decreased risk for further clinical deterioration. Furthermore, it is anticipated that the patient will be medically stable for discharge from the hospital within 2 midnights of admission.   Author: Gertha Calkin, MD 07/11/2023 7:22 PM  For on call  review [email or web address].   Unresulted Labs (From admission, onward)     Start     Ordered   07/12/23 0500  Basic metabolic panel  Tomorrow morning,   R       Question:  Specimen collection method  Answer:  Lab=Lab collect   07/11/23 1739   07/12/23 0500  CBC with Differential/Platelet  Tomorrow morning,   R       Question:  Specimen collection method  Answer:  Lab=Lab collect   07/11/23 1739   07/12/23 0500  Magnesium  Tomorrow morning,   R       Question:  Specimen collection method  Answer:  Lab=Lab collect   07/11/23 1739   07/11/23 1919  Vitamin B12  Add-on,   AD       Question:  Specimen collection method  Answer:  Lab=Lab collect   07/11/23 1918   07/11/23 1909  Ethanol  Add-on,   AD       Question:  Specimen collection method  Answer:  Lab=Lab collect   07/11/23 1908   07/11/23 1909  TSH  Once,   R       Question:  Specimen collection method  Answer:  Lab=Lab collect   07/11/23 1908   07/11/23 1909  T4, free  Add-on,   AD       Question:  Specimen collection method  Answer:  Lab=Lab collect   07/11/23  1908   07/11/23 1909  Magnesium  Add-on,   AD       Question:  Specimen collection method  Answer:  Lab=Lab collect   07/11/23 1908   07/11/23 1018  Blood Culture (routine x 2)  (Undifferentiated presentation (screening labs and basic nursing orders))  BLOOD CULTURE X 2,   STAT      07/11/23 1017            Orders Placed This Encounter  Procedures   Resp panel by RT-PCR (RSV, Flu A&B, Covid) Anterior Nasal Swab   Blood Culture (routine x 2)   DG Chest 2 View   Basic metabolic panel   CBC with Differential   Brain natriuretic peptide   Hepatic function panel   Lactic acid, plasma   Comprehensive metabolic panel   CBC with Differential   Urinalysis, w/ Reflex to Culture (Infection Suspected) -Urine, Clean Catch   Basic metabolic panel   CBC with Differential/Platelet   Magnesium   Hemoglobin A1c   Ethanol   TSH   T4, free   Magnesium   Vitamin B12   Diet  Heart Room service appropriate? Yes; Fluid consistency: Thin; Fluid restriction: 1500 mL Fluid   Cardiac Monitoring Continuous x 24 hours Indications for use: Other; other indications for use: High risk   Document height and weight   Assess and Document Glasgow Coma Scale   Document vital signs within 1-hour of fluid bolus completion.  Notify provider of abnormal vital signs despite fluid resuscitation.   Refer to Sidebar Report: Sepsis Bundle ED/IP   Notify provider for difficulties obtaining IV access   Initiate Carrier Fluid Protocol   Apply warming blanket (Bair Hugger)   DO NOT delay antibiotics if unable to obtain blood culture.   Notify physician (specify)   Initiate Heart Failure Care Plan   Daily weights   Strict intake and output   In and Out Cath   Patient Education:   Apply Heart Failure Care Plan   Patient has an active order for admit to inpatient/place in observation   Maintain IV access   Vital signs   Notify physician (specify)   Mobility Protocol: No Restrictions RN to initiate protocols based on patient's level of care   Refer to Sidebar Report Refer to ICU, Med-Surg, Progressive, and Step-Down Mobility Protocol Sidebars   Initiate Adult Central Line Maintenance and Catheter Protocol for patients with central line (CVC, PICC, Port, Hemodialysis, Trialysis)   If patient diabetic or glucose greater than 140 notify physician for Sliding Scale Insulin Orders   Do not place and if present remove PureWick   Initiate Oral Care Protocol   Initiate Carrier Fluid Protocol   RN may order General Admission PRN Orders utilizing "General Admission PRN medications" (through manage orders) for the following patient needs: allergy symptoms (Claritin), cold sores (Carmex), cough (Robitussin DM), eye irritation (Liquifilm Tears), hemorrhoids (Tucks), indigestion (Maalox), minor skin irritation (Hydrocortisone Cream), muscle pain Romeo Apple Gay), nose irritation (saline nasal spray) and sore  throat (Chloraseptic spray).   Swallow screen   Neurovascular checks   Full code   Consult to hospitalist   Consult to hospitalist   Code Sepsis activation.  This occurs automatically when order is signed and prioritizes pharmacy, lab, and radiology services for STAT collections and interventions.  If [facility] downtime, call [facility] [phone]) to activate Code Sepsis.   Inpatient consult to Cardiology   Nutritional services consult   CeFEPIme (MAXIPIME) per pharmacy consult  vancomycin per pharmacy consult   OT eval and treat   PT eval and treat   Oxygen therapy Mode or (Route): Nasal cannula; Liters Per Minute: 2   Pulse oximetry check with vital signs   Oxygen therapy Mode or (Route): Nasal cannula; Liters Per Minute: 2; Keep O2 saturation between: greater than 92 %   Incentive spirometry   ED EKG   EKG 12-Lead   ED EKG   Repeat EKG   EKG   EKG   EKG 12-Lead   ECHOCARDIOGRAM COMPLETE   Insert peripheral IV   Insert 2nd peripheral IV if not already present.   Insert peripheral IV   Place in observation (patient's expected length of stay will be less than 2 midnights)   Admit to Inpatient (patient's expected length of stay will be greater than 2 midnights or inpatient only procedure)   Aspiration precautions   Fall precautions   VAS [place] ABI WITH/WO TBI

## 2023-07-11 NOTE — Progress Notes (Signed)
 PHARMACY ROUNDING NOTE  Benjamin Woods is a 83 y.o. male awaiting admission. A chart review was completed to evaluate prior to admission medications, antibiotic therapy and labs/vitals. The following interventions were made:  Restart PTA apixaban  This was discussed with the ED or admitting provider and/or nurse/paramedic.   Lysle Pearl, PharmD, BCPS, BCEMP Clinical Pharmacist Please see AMION for all pharmacy numbers 07/11/2023 8:36 AM

## 2023-07-11 NOTE — ED Notes (Signed)
 Pts HR drops to upper 30-40's while at rest. Rate goes up to 60's when awake. EDP notified. Will obtain EKG. Pt without complaints

## 2023-07-11 NOTE — ED Notes (Signed)
Daughter updated via phone.

## 2023-07-11 NOTE — Plan of Care (Signed)
  Problem: Education: Goal: Knowledge of General Education information will improve Description: Including pain rating scale, medication(s)/side effects and non-pharmacologic comfort measures Outcome: Progressing   Problem: Clinical Measurements: Goal: Cardiovascular complication will be avoided Outcome: Progressing   Problem: Elimination: Goal: Will not experience complications related to urinary retention Outcome: Progressing

## 2023-07-11 NOTE — ED Notes (Signed)
 Overlooked order wanting pt on oxygen. Pt currently on 1L [place] saturating at 94-95%

## 2023-07-11 NOTE — ED Notes (Signed)
+  3 pitting edema BLE

## 2023-07-11 NOTE — Progress Notes (Signed)
 Pharmacy Antibiotic Note  Benjamin Woods is a 83 y.o. male admitted on 07/10/2023 with sepsis.  Low temp despite Bair hugger, WBC 3.3. Pharmacy has been consulted for cefepime and vancomycin dosing. Patient is also on metronidazole.   Received vancomycin load in ER.  ClCr 56 ml/min.  3/6 Vancomycin 1250mg  Q 24 hr with Est AUC: 524 Scr used: 125 mg/dL; Vd coeff: 0.5 L/kg (BMI 31)   Plan: Vancomycin 1250mg  q24hr   Cefepime 2g q12hr Monitor cultures, clinical status, renal function, vancomycin level Narrow abx as able and f/u duration    Height: 6' (182.9 cm) Weight: 105.6 kg (232 lb 12.9 oz) IBW/kg (Calculated) : 77.6  Temp (24hrs), Avg:95.6 F (35.3 C), Min:94.1 F (34.5 C), Max:97.5 F (36.4 C)  Recent Labs  Lab 07/10/23 1652 07/11/23 1018  WBC 4.3 3.3*  CREATININE 1.42* 1.25*  LATICACIDVEN  --  1.0    Estimated Creatinine Clearance: 56.2 mL/min (A) (by C-G formula based on SCr of 1.25 mg/dL (H)).    No Known Allergies  Antimicrobials this admission: vanc 3/6 >>  Cefe 3/6>> MTZ 3/6 >>   Dose adjustments this admission: N/a  Microbiology results: 3/6 BCx:  3/6 UCx:  3/6 RSV/flu/covid neg   Thank you for allowing pharmacy to be a part of this patient's care.  Alphia Moh, PharmD, BCPS, BCCP Clinical Pharmacist  Please check AMION for all [facility] Pharmacy phone numbers After 10:00 PM, call Main Pharmacy [phone]

## 2023-07-11 NOTE — Consult Note (Signed)
 Cardiology Consultation   Patient ID: Benjamin Woods MRN: [id number]; DOB: 1940/07/30  Admit date: 07/10/2023 Date of Consult: 07/11/2023  PCP:  [name], MD   [facility] Providers Cardiologist:  Gypsy Balsam, MD       Chief complaint: Shortness of breath Reason of consult: Acute decompensated heart failure/bradycardia Requesting physician: Dr. Irena Cords  Patient Profile:   Benjamin Woods is a 83 y.o. male with a hx of HTN, HLD, HFmrEF, Hx of DVT/PE on [facility],  who is being seen 07/11/2023 for the evaluation of Acute decompensated heart failure/bradycardia at the request of Dr. Irena Cords.  History of Present Illness:   Benjamin Woods  has been experiencing bilateral lower extremity swelling, weight gain of approximately 4 pounds since Monday, shortness of breath with effort related activities.  He called the outpatient cardiology office and was recommended to come to the emergency room department for further evaluation and management.  Earlier this morning patient was noted to be hypothermic with a temperature of 94.5%.  EKG noted underlying sinus bradycardia with conduction disease.  With rewarming patient's ventricular rate has improved.  Patient underwent infectious workup by attending physician.  Patient endorses hx of DVT/PE and has been taking OAC without skipped doses.   Past Medical History:  Diagnosis Date   Acute combined systolic and diastolic congestive heart failure (HCC) 12/10/2018   Acute combined systolic and diastolic heart failure (HCC)    BPH (benign prostatic hyperplasia)    Cardiomyopathy (HCC)    DVT (deep venous thrombosis) (HCC) 12/10/2018   Elevated troponin 12/10/2018   GERD (gastroesophageal reflux disease)    Gout    History of pulmonary embolism 12/22/2019   HTN (hypertension)    Hypocalcemia    Hypokalemia    Pressure injury of skin 12/11/2018   Protein-calorie malnutrition, moderate (HCC) 12/10/2018   Pulmonary embolism  (HCC)    Stroke (cerebrum) (HCC)    Syncope    Systolic congestive heart failure (HCC) 01/30/2019    Past Surgical History:  Procedure Laterality Date   HAND SURGERY       Home Medications:  Prior to Admission medications   Medication Sig Start Date End Date Taking? Authorizing Provider  allopurinol (ZYLOPRIM) 300 MG tablet Take 300 mg by mouth daily.   Yes [provider]  amLODipine (NORVASC) 10 MG tablet Take 10 mg by mouth daily. 06/07/22  Yes [provider]  apixaban (ELIQUIS) 5 MG TABS tablet Take 5 mg by mouth 2 (two) times daily.   Yes [provider]  atorvastatin (LIPITOR) 20 MG tablet Take 1 tablet (20 mg total) by mouth every evening. 01/14/23  Yes Flossie Dibble, NP  carvedilol (COREG) 25 MG tablet Take 25 mg by mouth 2 (two) times daily with a meal.   Yes [provider]  cetirizine (ZYRTEC) 10 MG tablet Take 10 mg by mouth daily. 05/04/21  Yes [provider]  colchicine 0.6 MG tablet Take 0.6 mg by mouth daily.   Yes [provider]  DEXILANT 60 MG capsule Take 1 capsule by mouth daily. 03/14/19  Yes [provider]  ENTRESTO 24-26 MG Take 1 tablet by mouth 2 (two) times daily. 01/13/19  Yes [provider]  Febuxostat 80 MG TABS Take 80 mg by mouth daily. 12/19/19  Yes [provider]  furosemide (LASIX) 20 MG tablet Take 1 tablet (20 mg total) by mouth daily. 02/17/19 07/10/24 Yes Georgeanna Lea, MD  guaiFENesin [facility]) 600  MG 12 hr tablet Take by mouth 2 (two) times daily.   Yes [provider]  latanoprost (XALATAN) 0.005 % ophthalmic solution Place 1 drop into both eyes at bedtime. 06/13/22  Yes [provider]  potassium chloride (KLOR-CON) 10 MEQ tablet Take 10 mEq by mouth daily. 06/07/22  Yes [provider]  sildenafil (VIAGRA) 100 MG tablet Take 100 mg by mouth daily as needed for erectile dysfunction. One hour prior to sex 10/08/18  Yes [provider]   tamsulosin (FLOMAX) 0.4 MG CAPS capsule Take 0.4 mg by mouth 2 (two) times daily. 12/19/19  Yes [provider]    Inpatient Medications: Scheduled Meds:  allopurinol  300 mg Oral Daily   apixaban  5 mg Oral BID   atorvastatin  20 mg Oral QPM   febuxostat  80 mg Oral Daily   furosemide  40 mg Intravenous Q12H   latanoprost  1 drop Both Eyes QHS   metroNIDAZOLE  500 mg Oral Q12H   sodium chloride flush  3 mL Intravenous Q12H   sodium chloride flush  3 mL Intravenous Q12H   tamsulosin  0.4 mg Oral BID   thiamine  100 mg Oral Daily   Continuous Infusions:  PRN Meds: acetaminophen **OR** acetaminophen, hydrALAZINE, morphine injection, sodium chloride flush  Allergies:   No Known Allergies  Social History:   Social History   Socioeconomic History   Marital status: Married    Spouse name: Not on file   Number of children: Not on file   Years of education: Not on file   Highest education level: Not on file  Occupational History   Not on file  Tobacco Use   Smoking status: Former   Smokeless tobacco: Never  Vaping Use   Vaping status: Never Used  Substance and Sexual Activity   Alcohol use: Not Currently   Drug use: Never   Sexual activity: Not Currently  Other Topics Concern   Not on file  Social History Narrative   Not on file   Social Drivers of Health   Financial Resource Strain: Low Risk  (08/24/2022)   Overall Financial Resource Strain (CARDIA)    Difficulty of Paying Living Expenses: Not hard at all  Food Insecurity: No Food Insecurity (07/11/2023)   Hunger Vital Sign    Worried About Running Out of Food in the Last Year: Never true    Ran Out of Food in the Last Year: Never true  Transportation Needs: No Transportation Needs (07/11/2023)   PRAPARE - Administrator, Civil Service (Medical): No    Lack of Transportation (Non-Medical): No  Physical Activity: Insufficiently Active (08/24/2022)   Exercise Vital Sign    Days of Exercise per  Week: 3 days    Minutes of Exercise per Session: 10 min  Stress: Not on file  Social Connections: Moderately Isolated (07/11/2023)   Social Connection and Isolation Panel [NHANES]    Frequency of Communication with Friends and Family: Once a week    Frequency of Social Gatherings with Friends and Family: Once a week    Attends Religious Services: 1 to 4 times per year    Active Member of [employer] or Organizations: No    Attends Banker Meetings: Never    Marital Status: Married  Catering manager Violence: Not At Risk (07/11/2023)   Humiliation, Afraid, Rape, and Kick questionnaire    Fear of Current or Ex-Partner: No    Emotionally Abused: No    Physically  Abused: No    Sexually Abused: No    Family History:   History reviewed. No pertinent family history.   ROS:  Review of Systems  Constitutional: Positive for weight gain.  Cardiovascular:  Positive for dyspnea on exertion and leg swelling. Negative for chest pain, claudication, irregular heartbeat, near-syncope, orthopnea, palpitations, paroxysmal nocturnal dyspnea and syncope.  Hematologic/Lymphatic: Negative for bleeding problem.  Genitourinary:  Negative for dysuria.      Physical Exam/Data:   Vitals:   07/11/23 1145 07/11/23 1350 07/11/23 1556 07/11/23 1658  BP:  125/68    Pulse: (!) 59 62  60  Resp: 17 16    Temp: (!) 95.3 F (35.2 C) (!) 97.5 F (36.4 C)  (!) 96.4 F (35.8 C)  TempSrc:  Oral  Axillary  SpO2: 94% 97% 100% 95%  Weight:  105.6 kg    Height:  6' (1.829 m)      Intake/Output Summary (Last 24 hours) at 07/11/2023 2003 Last data filed at 07/11/2023 1522 Gross per 24 hour  Intake 398.09 ml  Output 2900 ml  Net -2501.91 ml      07/11/2023    1:50 PM 07/10/2023    4:49 PM 01/14/2023    3:15 PM  Last 3 Weights  Weight (lbs) 232 lb 12.9 oz 245 lb 222 lb 12.8 oz  Weight (kg) 105.6 kg 111.131 kg 101.061 kg     Body mass index is 31.57 kg/m.  General:  Well nourished, well developed, in no acute  distress, ambulates with a cane, currently using a bear hugger HEENT: normal Neck: + JVD Cardiac: Regular rate and rhythm, positive S1-S2, no murmurs rubs or gallops appreciated. Lungs: Decreased breath sounds bilaterally mid to apex, mild rales. Abd: soft, nontender, no hepatomegaly  Ext: + Pitting edema extending to bilateral knees Musculoskeletal:  No deformities, BUE and BLE strength normal and equal Skin: warm and dry  Neuro:  CNs 2-12 intact, no focal abnormalities noted Psych:  Normal affect   EKG:  The EKG was personally reviewed and demonstrates: 07/10/2023: 1659:: Sinus bradycardia first-degree AV block, without underlying ischemia or injury pattern  07/11/2023: 11:10: Sinus bradycardia, 58bpm, second-degree type I AV block 11:29: Sinus bradycardia 2-1 AV block, cannot rule out second degree type II AV block.  Telemetry:  Telemetry was personally reviewed and demonstrates: Sinus rhythm with first-degree AV block  Relevant CV Studies: Echo 07/18/2022  1. Left ventricular ejection fraction, by estimation, is 45 to 50%. The  left ventricle has mildly decreased function. The left ventricle has no  regional wall motion abnormalities. Left ventricular diastolic parameters  are consistent with Grade I  diastolic dysfunction (impaired relaxation). Elevated left ventricular  end-diastolic pressure.   2. Right ventricular systolic function is normal. The right ventricular  size is normal.   3. Left atrial size was mildly dilated.   4. The mitral valve is degenerative. No evidence of mitral valve  regurgitation. No evidence of mitral stenosis.   5. The aortic valve is tricuspid. Aortic valve regurgitation is not  visualized. No aortic stenosis is present.   6. There is moderate dilatation of the ascending aorta and of the aortic  root, measuring 41 mm.   Laboratory Data:  High Sensitivity Troponin:   Recent Labs  Lab 07/10/23 1654 07/10/23 2030  TROPONINIHS 10 10      Chemistry Recent Labs  Lab 07/10/23 1652 07/11/23 1018  NA 134* 136  K 4.8 4.0  CL 104 103  CO2 26 27  GLUCOSE 107* 162*  BUN 27* 25*  CREATININE 1.42* 1.25*  CALCIUM 9.0 8.7*  GFRNONAA 49* 57*  ANIONGAP 4* 6    Recent Labs  Lab 07/10/23 1652 07/11/23 1018  PROT 8.2* 7.6  ALBUMIN 3.3* 3.1*  AST 29 29  ALT 27 26  ALKPHOS 143* 143*  BILITOT 0.5 0.5   Lipids No results for input(s): "CHOL", "TRIG", "HDL", "LABVLDL", "LDLCALC", "CHOLHDL" in the last 168 hours.  Hematology Recent Labs  Lab 07/10/23 1652 07/11/23 1018  WBC 4.3 3.3*  RBC 4.52 4.41  HGB 12.0* 11.9*  HCT 39.2 38.0*  MCV 86.7 86.2  MCH 26.5 27.0  MCHC 30.6 31.3  RDW 15.5 15.6*  PLT 109* 99*   Thyroid No results for input(s): "TSH", "FREET4" in the last 168 hours.  BNP Recent Labs  Lab 07/10/23 1652  BNP 426.6*    DDimer No results for input(s): "DDIMER" in the last 168 hours.   Radiology/Studies:  DG Chest 2 View Result Date: 07/10/2023 CLINICAL DATA:  Shortness of breath and bilateral foot swelling x2 days. EXAM: CHEST - 2 VIEW COMPARISON:  August 14, 2019 FINDINGS: The heart size and mediastinal contours are within normal limits. There is moderate to marked severity calcification of the aortic arch. Low lung volumes are seen with mild, stable elevation of the right hemidiaphragm. Mild, diffusely increased interstitial lung markings are present. Mild right infrahilar scarring and/or atelectasis is also noted. No pleural effusion or pneumothorax is identified. Multilevel degenerative changes are seen throughout the thoracic spine. IMPRESSION: 1. Low lung volumes with mild, diffusely increased interstitial lung markings which may represent mild interstitial edema. 2. Mild right infrahilar scarring and/or atelectasis. Electronically Signed   By: Aram Candela M.D.   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  His lower extremities are warm and moist. BNP elevated and chest x-ray notes interstitial markings. Net IO Since Admission: -2,501.91 mL [07/11/23 2003] Currently was receiving Lasix 20 mg IV push 3 times daily, will transition him to Lasix 40 twice daily IV push Strict I's and O's and daily weights Bilateral lower extremity compression stockings to help mobilize fluid Primary care has already ordered an echocardiogram, results pending Recommend discontinuation of amlodipine as this could be contributing to his lower extremity swelling as well.  Bradycardia - resolved While in the emergency room department patient was noted to have sinus rhythm with secondary type I AV block in 2-1 conduction concerning for possible secondary type II AV block.  This was felt to be secondary to hypothermia.  After rewarming the patient his conduction disease has improved. 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 Monitor BUN and creatinine. 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 Echocardiogram forthcoming Monitor for now  Risk Assessment/Risk Scores:       New York Heart  Association (NYHA) Functional Class NYHA Class III   For questions or updates, please contact [facility] Please consult [email or web address]  for contact info under    Signed, Tessa Lerner, DO, [facility]  [facility]  [address] [place], [place] [address] 07/11/2023 8:03 PM

## 2023-07-11 NOTE — ED Provider Notes (Addendum)
 8:54 AM I was asked to see patient as his temp is 94.5.  He appears quite well and is stable on oxygen.  He has pitting edema to his legs that is in no distress and does not seem altered.  Will put multiple warm blankets and recheck in about an hour but he does not appear ill to suggest that this is from an infection.  Will continue to monitor while he is in the emergency department.    10:17 AM Temp is still 94.1. Will put on [employer], recheck labs (still boarding waiting for bed at [facility]) and get cultures. Otherwise is well appearing, will wait on antibiotics for now.   11:27 AM I discussed with Dr. Katrinka Blazing.  Will upgrade his bed to progressive as his temperature is not coming up.  He is requiring active rewarming.  He is also advising to give broad antibiotics for unclear possible sepsis.  Will also consult cardiology as his heart rate has been trending downward and he seems to have a second degree AV block.  11:35 AM I discussed with cardiology, Dr. Royann Shivers.  Would be okay to give atropine if he remains bradycardic or develops hypotension.  Looks like a second-degree type II block on the most recent ECG but this should improve whenever his temperature improves.  Right now his heart rate is intermittent, sometimes will be bradycardic but often is in the 60s and his pressure is currently stable.  Will consider atropine if needed and if that were to occur would send ED to ED instead of waiting on his progressive bed.  12:44 PM Has remained stable for transfer. HR in 60s.  He ultimately did not require any treatment such as atropine.   EKG Interpretation Date/Time:  Thursday July 11 2023 11:10:59 EST Ventricular Rate:  58 PR Interval:  318 QRS Duration:  106 QT Interval:  485 QTC Calculation: 477 R Axis:   7  Text Interpretation: Sinus rhythm Second deg AVB, Mobitz I (Wenckebach) Left atrial enlargement Borderline prolonged QT interval Confirmed by Pricilla Loveless [phone]) on 07/11/2023  11:30:18 AM         CRITICAL CARE Performed by: Audree Camel   Total critical care time: 30 minutes  Critical care time was exclusive of separately billable procedures and treating other patients.  Critical care was necessary to treat or prevent imminent or life-threatening deterioration.  Critical care was time spent personally by me on the following activities: development of treatment plan with patient and/or surrogate as well as nursing, discussions with consultants, evaluation of patient's response to treatment, examination of patient, obtaining history from patient or surrogate, ordering and performing treatments and interventions, ordering and review of laboratory studies, ordering and review of radiographic studies, pulse oximetry and re-evaluation of patient's condition.      Pricilla Loveless, MD 07/11/23 [phone]

## 2023-07-11 NOTE — TOC Initial Note (Signed)
 Transition of Care [facility]) - Initial/Assessment Note    Patient Details  Name: Benjamin Woods MRN: [id number] Date of Birth: 30-Jul-1940  Transition of Care [facility]) CM/SW Contact:    Leone Haven, RN Phone Number: 07/11/2023, 3:21 PM  Clinical Narrative:                 From home with spouse, but right now spouse is in a SNF so he is home alone at this time,  has PCP , Junious Dresser and insurance on file, states has no HH services in place at this time, has a cane  at home.  States daughter will transport them home at [employer] and daughter is support system, states gets medications from [facility] in [place] on [address].  Pta self ambulatory with cane.  Expected Discharge Plan: Home/Self Care Barriers to Discharge: Continued Medical Work up   Patient Goals and CMS Choice Patient states their goals for this hospitalization and ongoing recovery are:: return home   Choice offered to / list presented to : NA      Expected Discharge Plan and Services In-house Referral: NA Discharge Planning Services: CM Consult Post Acute Care Choice: NA Living arrangements for the past 2 months: Single Family Home                 DME Arranged: N/A DME Agency: NA       HH Arranged: NA          Prior Living Arrangements/Services Living arrangements for the past 2 months: Single Family Home Lives with:: Spouse (spouse is in a nursing home right now) Patient language and need for interpreter reviewed:: Yes Do you feel safe going back to the place where you live?: Yes      Need for Family Participation in Patient Care: Yes (Comment) Care giver support system in place?: Yes (comment) Current home services: DME (cane) Criminal Activity/Legal Involvement Pertinent to Current Situation/Hospitalization: No - Comment as needed  Activities of Daily Living   ADL Screening (condition at time of admission) Independently performs ADLs?: Yes (appropriate for developmental age) Is  the patient deaf or have difficulty hearing?: No Does the patient have difficulty seeing, even when wearing glasses/contacts?: No Does the patient have difficulty concentrating, remembering, or making decisions?: No  Permission Sought/Granted Permission sought to share information with : Case Manager Permission granted to share information with : Yes, Verbal Permission Granted              Emotional Assessment Appearance:: Appears stated age Attitude/Demeanor/Rapport: Engaged Affect (typically observed): Appropriate Orientation: : Oriented to Self, Oriented to Place, Oriented to  Time, Oriented to Situation Alcohol / Substance Use: Not Applicable Psych Involvement: No (comment)  Admission diagnosis:  Pleural effusion [J90] Acute exacerbation of CHF (congestive heart failure) (HCC) [I50.9] CHF exacerbation (HCC) [I50.9] Acute on chronic congestive heart failure, unspecified heart failure type (HCC) [I50.9] Patient Active Problem List   Diagnosis Date Noted   CHF exacerbation (HCC) 07/11/2023   Cystitis 07/11/2023   Dyslipidemia 12/01/2020   Pulmonary embolism (HCC)    Hypokalemia    Hypocalcemia    GERD (gastroesophageal reflux disease)    Acute on chronic combined systolic and diastolic CHF (congestive heart failure) (HCC)    History of pulmonary embolism 12/22/2019   Systolic congestive heart failure (HCC) 01/30/2019   Pressure injury of skin 12/11/2018   Syncope 12/10/2018   Elevated troponin 12/10/2018   Protein-calorie malnutrition, moderate (HCC) 12/10/2018   DVT (deep venous  thrombosis) (HCC) 12/10/2018   Stroke (cerebrum) (HCC)    HTN (hypertension)    Gout    BPH (benign prostatic hyperplasia)    PCP:  [name], MD Pharmacy:   [facility] [phone] Rosalita Levan, [place] - [address] AT [facility] [address] [place] [address] Phone: [phone] Fax: [phone]     Social Drivers of Health  (SDOH) Social History: SDOH Screenings   Food Insecurity: No Food Insecurity (07/11/2023)  Housing: Low Risk  (07/11/2023)  Transportation Needs: No Transportation Needs (07/11/2023)  Utilities: Not At Risk (07/11/2023)  Alcohol Screen: Low Risk  (08/24/2022)  Depression (PHQ2-9): Low Risk  (03/25/2023)  Financial Resource Strain: Low Risk  (08/24/2022)  Physical Activity: Insufficiently Active (08/24/2022)  Social Connections: Moderately Isolated (07/11/2023)  Tobacco Use: Medium Risk (07/10/2023)  Health Literacy: Adequate Health Literacy (03/25/2023)   SDOH Interventions:     Readmission Risk Interventions     No data to display

## 2023-07-11 NOTE — ED Notes (Signed)
 Pt sitting up in bed. Alert and oriented. No complaints of pain or SOB. Unable to obtain oral temp. Rectal obtained .EDP notified of rectal temp 94.5. EDP in to assess. Layer with warm blankets and then reassess. Will collect Resp panel. Call light in reach. Bed low position

## 2023-07-12 ENCOUNTER — Inpatient Hospital Stay (HOSPITAL_COMMUNITY)

## 2023-07-12 ENCOUNTER — Other Ambulatory Visit: Payer: Self-pay | Admitting: Cardiology

## 2023-07-12 DIAGNOSIS — I5033 Acute on chronic diastolic (congestive) heart failure: Secondary | ICD-10-CM | POA: Diagnosis not present

## 2023-07-12 DIAGNOSIS — I10 Essential (primary) hypertension: Secondary | ICD-10-CM

## 2023-07-12 DIAGNOSIS — I441 Atrioventricular block, second degree: Secondary | ICD-10-CM | POA: Diagnosis not present

## 2023-07-12 DIAGNOSIS — D61818 Other pancytopenia: Secondary | ICD-10-CM

## 2023-07-12 DIAGNOSIS — N401 Enlarged prostate with lower urinary tract symptoms: Secondary | ICD-10-CM | POA: Diagnosis not present

## 2023-07-12 DIAGNOSIS — I824Z2 Acute embolism and thrombosis of unspecified deep veins of left distal lower extremity: Secondary | ICD-10-CM

## 2023-07-12 DIAGNOSIS — I5021 Acute systolic (congestive) heart failure: Secondary | ICD-10-CM

## 2023-07-12 DIAGNOSIS — K219 Gastro-esophageal reflux disease without esophagitis: Secondary | ICD-10-CM

## 2023-07-12 DIAGNOSIS — I5043 Acute on chronic combined systolic (congestive) and diastolic (congestive) heart failure: Secondary | ICD-10-CM | POA: Diagnosis not present

## 2023-07-12 LAB — ECHOCARDIOGRAM COMPLETE
Area-P 1/2: 2.35 cm2
Calc EF: 54.6 %
Height: 72 in
S' Lateral: 3.7 cm
Single Plane A2C EF: 48.3 %
Single Plane A4C EF: 58.2 %
Weight: 3735.47 [oz_av]

## 2023-07-12 LAB — CBC WITH DIFFERENTIAL/PLATELET
Abs Immature Granulocytes: 0.01 10*3/uL (ref 0.00–0.07)
Basophils Absolute: 0 10*3/uL (ref 0.0–0.1)
Basophils Relative: 1 %
Eosinophils Absolute: 0.2 10*3/uL (ref 0.0–0.5)
Eosinophils Relative: 3 %
HCT: 37.8 % — ABNORMAL LOW (ref 39.0–52.0)
Hemoglobin: 12 g/dL — ABNORMAL LOW (ref 13.0–17.0)
Immature Granulocytes: 0 %
Lymphocytes Relative: 33 %
Lymphs Abs: 1.7 10*3/uL (ref 0.7–4.0)
MCH: 27 pg (ref 26.0–34.0)
MCHC: 31.7 g/dL (ref 30.0–36.0)
MCV: 84.9 fL (ref 80.0–100.0)
Monocytes Absolute: 0.7 10*3/uL (ref 0.1–1.0)
Monocytes Relative: 13 %
Neutro Abs: 2.5 10*3/uL (ref 1.7–7.7)
Neutrophils Relative %: 50 %
Platelets: 104 10*3/uL — ABNORMAL LOW (ref 150–400)
RBC: 4.45 MIL/uL (ref 4.22–5.81)
RDW: 15.6 % — ABNORMAL HIGH (ref 11.5–15.5)
WBC: 5 10*3/uL (ref 4.0–10.5)
nRBC: 0 % (ref 0.0–0.2)

## 2023-07-12 LAB — BASIC METABOLIC PANEL
Anion gap: 5 (ref 5–15)
BUN: 25 mg/dL — ABNORMAL HIGH (ref 8–23)
CO2: 28 mmol/L (ref 22–32)
Calcium: 8.4 mg/dL — ABNORMAL LOW (ref 8.9–10.3)
Chloride: 105 mmol/L (ref 98–111)
Creatinine, Ser: 1.71 mg/dL — ABNORMAL HIGH (ref 0.61–1.24)
GFR, Estimated: 39 mL/min — ABNORMAL LOW (ref >60)
Glucose, Bld: 94 mg/dL (ref 70–99)
Potassium: 4 mmol/L (ref 3.5–5.1)
Sodium: 138 mmol/L (ref 135–145)

## 2023-07-12 LAB — MRSA NEXT GEN BY PCR, NASAL: MRSA by PCR Next Gen: NOT DETECTED

## 2023-07-12 LAB — MAGNESIUM: Magnesium: 1.9 mg/dL (ref 1.7–2.4)

## 2023-07-12 MED ORDER — ENSURE ENLIVE PO LIQD
237.0000 mL | ORAL | Status: DC
  Administered 2023-07-13 – 2023-07-16 (×4): 237 mL via ORAL

## 2023-07-12 NOTE — Assessment & Plan Note (Addendum)
 Echocardiogram with mild reduction in LV systolic function EF 45 to 50%, mild dilatated LV cavity, RV systolic function preserved. LA with moderate dilatation, no significant valvular disease.  LV inferior basal septal hypokinesis,   Urine output is 2,200 ml Systolic blood pressure 107 mmHg range.   On hold furosemide for now, per cardiology recommendations.  Continue to hold on RAAS inhibition due to acute worsening in GFR.  Hold on Carvedilol due to AV block.

## 2023-07-12 NOTE — Progress Notes (Signed)
 Two attempts to perform ABI test. Both times patient was unavailable with PT and OT. May try again this afternoon or in the am.Kaisha Wachob, BS, RDMS, RVT

## 2023-07-12 NOTE — Plan of Care (Signed)

## 2023-07-12 NOTE — Progress Notes (Signed)
   Patient Name: Benjamin Woods Date of Encounter: 07/12/2023 [facility] Cardiologist: Gypsy Balsam, MD   Interval Summary  .    Breathing better but still at baseline, was able to get socks on this morning.  Edema has improved.  Working with physical therapy currently.  Just stood up out of chair.  Frail.  Vital Signs .    Vitals:   07/11/23 1900 07/12/23 0018 07/12/23 0337 07/12/23 0445  BP: 121/65 121/64 124/63   Pulse: 64 72 75   Resp: 18 18 17    Temp: 97.8 F (36.6 C) 98.3 F (36.8 C) 98.5 F (36.9 C)   TempSrc: Oral Oral Oral   SpO2: 95% 96% 94%   Weight:    105.9 kg  Height:        Intake/Output Summary (Last 24 hours) at 07/12/2023 1035 Last data filed at 07/12/2023 0400 Gross per 24 hour  Intake 738.09 ml  Output 1400 ml  Net -661.91 ml      07/12/2023    4:45 AM 07/11/2023    1:50 PM 07/10/2023    4:49 PM  Last 3 Weights  Weight (lbs) 233 lb 7.5 oz 232 lb 12.9 oz 245 lb  Weight (kg) 105.9 kg 105.6 kg 111.131 kg      Telemetry/ECG    No adverse arrhythmias- Personally Reviewed  Physical Exam .   GEN: No acute distress.   Neck: No JVD Cardiac: RRR, no murmurs, rubs, or gallops.  Respiratory: Mild crackles at bases bilaterally. GI: Soft, nontender, non-distended  MS: 1+ LE edema  Assessment & Plan .     Acute on chronic systolic heart failure-EF 45 to 50%. -BNP 426, NYHA class III stage C -Now on Lasix 40 mg IV twice daily, 2.4 L out yesterday. -Creatinine has increased from 1.25-1.71 but was 1.14 on admission.  Still has more fluid to diurese.  Repeat creatinine tomorrow. -Previously stopped amlodipine which can contribute to lower extremity edema  Bradycardia - Improved.  Second-degree AV block type I previously noted -At home patient was taking carvedilol 25 mg twice a day.  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      For questions or updates, please contact [facility] Please consult [email or web address] for contact info under        Signed, Donato Schultz, MD

## 2023-07-12 NOTE — Assessment & Plan Note (Addendum)
 History of pulmonary embolism.  Continue anticoagulation.

## 2023-07-12 NOTE — Assessment & Plan Note (Addendum)
 Continue carvedilol, follow up renal function as outpatient in order to resume RAAS inhibition.

## 2023-07-12 NOTE — Assessment & Plan Note (Addendum)
 Telemetry with blocked PAC, not signs of AV block, plan to resume low dose carvedilol at 3,125 mg bid.  Follow up as outpatient.

## 2023-07-12 NOTE — Progress Notes (Signed)
 Occupational Therapy Treatment Patient Details Name: Benjamin Woods MRN: [id number] DOB: 1940/06/20 Today's Date: 07/12/2023   History of present illness Benjamin Woods is a 83 y.o. M admitted 07/10/23 with sepsis. Cardiology consulted for evaluation of acute decompensated heart failure/bradycardia. 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   OT comments  Pt currently at min guard to supervision assist for selfcare tasks and functional transfers with use of the single point cane.  Oxygen sats at 90-92% on 2Ls nasal cannula post activity.  Unable to obtain accurate reading during activity this session.  Pt lives alone and has some assist once per week for homemanagement.  His daughter checks on him and visits once every couple weeks per his report and his brother comes by occasionally.  Feel he will benefit from acute care OT to help provide education on safety and energy conservation while also working on improving balance and strengthening for safer completion of selfcare tasks and transfers.  Recommend HHOT at discharge for continued progress.       If plan is discharge home, recommend the following:  Assistance with cooking/housework   Equipment Recommendations  None recommended by OT       Precautions / Restrictions Precautions Precautions: Fall Recall of Precautions/Restrictions: Intact Restrictions Weight Bearing Restrictions Per Provider Order: No       Mobility Bed Mobility               General bed mobility comments: Pt already sitting up in the recliner to start session.    Transfers Overall transfer level: Needs assistance Equipment used: Straight cane Transfers: Sit to/from Stand, Bed to chair/wheelchair/BSC Sit to Stand: Supervision     Step pivot transfers: Contact guard assist     General transfer comment: Contact guard for ambulation to and from the bathroom with use of his single point cane.     Balance Overall balance assessment: Needs  assistance Sitting-balance support: No upper extremity supported, Feet supported Sitting balance-Leahy Scale: Fair     Standing balance support: Single extremity supported, Bilateral upper extremity supported, During functional activity Standing balance-Leahy Scale: Fair Standing balance comment: Pt needs use of a cane for mobility but can stand statically without UE support during toileting or grooming tasks.                           ADL either performed or assessed with clinical judgement   ADL Overall ADL's : Needs assistance/impaired Eating/Feeding: Independent   Grooming: Wash/dry hands;Supervision/safety;Standing   Upper Body Bathing: Set up;Sitting   Lower Body Bathing: Supervison/ safety;Sit to/from stand   Upper Body Dressing : Set up;Sitting   Lower Body Dressing: Supervision/safety;Sit to/from stand   Toilet Transfer: Ambulation;Contact guard Engineer, manufacturing Details (indicate cue type and reason): single point cane Toileting- Clothing Manipulation and Hygiene: Supervision/safety;Sit to/from stand       Functional mobility during ADLs: Supervision/safety;Cane General ADL Comments: Pt's BP in sitting 109/62.  HR maintained in the mid 70s with oxygen at 90-92% on 2Ls nasal cannula.  Able to use single point cane for mobility throughout session in the room without LOB.  Pt reports having some hired assist for [employer] tasks but he still does a lot on his own.    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  Exhibits 70% gross flexion and extension in the right hand  and 80% on the left.  Shoulder and elbow AROM WFLs with strength 3+/5 throughout.)   Lower Extremity Assessment Lower Extremity Assessment: Defer to PT evaluation   Cervical / Trunk Assessment Cervical / Trunk Assessment: Normal    Vision Baseline  Vision/History: 3 Glaucoma Ability to See in Adequate Light: 0 Adequate Patient Visual Report: No change from baseline Vision Assessment?: No apparent visual deficits   Perception Perception Perception: Not tested   Praxis Praxis Praxis: Not tested   Communication     Cognition Arousal: Alert Behavior During Therapy: WFL for tasks assessed/performed                                 Following commands: Intact                      Pertinent Vitals/ Pain       Pain Assessment Pain Assessment: No/denies pain  Home Living Family/patient expects to be discharged to:: Private residence Living Arrangements: Spouse/significant other;Alone (Wife is at a SNF currently, so he is alone right now) Available Help at Discharge: Family;Available PRN/intermittently Type of Home: Apartment Home Access: Level entry     Home Layout: One level     Bathroom Shower/Tub: Chief Strategy Officer: Standard Bathroom Accessibility: Yes   Home Equipment: Cane - single point;Rolling Walker (2 wheels);Shower seat   Additional Comments: Wife has been at a SNF for ~2 years. His daughter checks in ~2 weeks. A person comes in for cleaning every week.          Frequency  Min 2X/week        Progress Toward Goals  OT Goals(current goals can now be found in the care plan section)     Acute Rehab OT Goals Patient Stated Goal: Pt wants to get better, get the fluid off, and go back home. OT Goal Formulation: With patient Time For Goal Achievement: 07/26/23 Potential to Achieve Goals: Good  Plan         AM-PAC OT "6 Clicks" Daily Activity     Outcome Measure   Help from another person eating meals?: None Help from another person taking care of personal grooming?: A Little Help from another person toileting, which includes using toliet, bedpan, or urinal?: A Little Help from another person bathing (including washing, rinsing, drying)?: A Little Help from  another person to put on and taking off regular upper body clothing?: A Little Help from another person to put on and taking off regular lower body clothing?: A Little 6 Click Score: 19    End of Session Equipment Utilized During Treatment: Gait belt;Other (comment) (single point cane)  OT Visit Diagnosis: Unsteadiness on feet (R26.81);Other abnormalities of gait and mobility (R26.89);Muscle weakness (generalized) (M62.81)   Activity Tolerance Patient tolerated treatment well   Patient Left in bed;with call bell/phone within reach   Nurse Communication Mobility status        Time: 1610-9604 OT Time Calculation (min): 37 min  Charges: OT General Charges $OT Visit: 1 Visit OT Evaluation $OT Eval Moderate Complexity: 1 Mod OT Treatments $Self Care/Home Management : 8-22 mins  Perrin Maltese, OTR/L Acute Rehabilitation Services  Office [phone] 07/12/2023

## 2023-07-12 NOTE — Progress Notes (Signed)
 Initial Nutrition Assessment  DOCUMENTATION CODES:   Obesity unspecified  INTERVENTION:  Ensure Enlive po daily, each supplement provides 350 kcal and 20 grams of protein.  Magic cup daily with meal, each supplement provides 290 kcal and 9 grams of protein  Liberalize diet to 2g sodium to encourage adequate po intake  NUTRITION DIAGNOSIS:   Increased nutrient needs related to chronic illness as evidenced by estimated needs.  GOAL:   Patient will meet greater than or equal to 90% of their needs  MONITOR:   PO intake, Weight trends, Supplement acceptance  REASON FOR ASSESSMENT:   Consult Assessment of nutrition requirement/status  ASSESSMENT:   Pt presented with worsening bilateral lower extremity swelling and admitted for acute on chronic combined systolic and diastolic CHF, UTI. Pt with PMH of gout, obesity, HTN, PE & DVT, dyslipidemia, gout.   Pt resting in bed upon visit and appeared to be very tired. Pt not very talkative but would answer questions briefly. Pt reports he has not had any changes in his appetite recently and eats 2 meals a day at home with an ensure in between breakfast and lunch. Pt reports not having any trouble eating the meals he is receiving in the hospital and estimates he is eating most of his meals. He reports only cooking eggs for breakfast at home and eating more packaged and ready to eat foods for his other meal. Pt was unsure if he would like magic cup. RD will add to dinner tray to trial if he likes it.  Pt reports gaining weight recently due to being fluid overloaded. Per chart review pt has had variable weights over the past year. His weight fluctuations could be due to fluid overload  Pt reports getting around at home with a cane and living alone. He reports that he visits his wife at SNF, which she has been at for over a year.  NUTRITION - FOCUSED PHYSICAL EXAM:  Flowsheet Row Most Recent Value  Orbital Region No depletion  Upper Arm  Region Moderate depletion  Thoracic and Lumbar Region No depletion  Buccal Region No depletion  Temple Region Moderate depletion  Clavicle Bone Region Mild depletion  Clavicle and Acromion Bone Region No depletion  Scapular Bone Region No depletion  Dorsal Hand Unable to assess  [deformities on fingers]  Patellar Region Unable to assess  Anterior Thigh Region Unable to assess  [edema to bilateral LE's]  Posterior Calf Region Unable to assess  Edema (RD Assessment) Severe  [pitting]  Hair Reviewed  Eyes Reviewed  Mouth Reviewed  Skin Reviewed  Nails Reviewed       Diet Order:   Diet Order             Diet Heart Room service appropriate? Yes; Fluid consistency: Thin; Fluid restriction: 1500 mL Fluid  Diet effective now                   EDUCATION NEEDS:   Not appropriate for education at this time  Skin:  Skin Assessment: Reviewed RN Assessment  Last BM:  3/3  Height:   Ht Readings from Last 1 Encounters:  07/11/23 6' (1.829 m)    Weight:   Wt Readings from Last 1 Encounters:  07/12/23 105.9 kg    Ideal Body Weight:  80.9 kg  BMI:  Body mass index is 31.66 kg/m.  Estimated Nutritional Needs:   Kcal:  1900-2100  Protein:  95-105 g  Fluid:  <1.5 L    Maceo Pro,  MS Dietetic Intern

## 2023-07-12 NOTE — Assessment & Plan Note (Addendum)
 Wbc has recovered to 5,7 continue to have anemia at 12.1 and thrombocytopenia 104  Follow up cell count as outpatient.

## 2023-07-12 NOTE — Assessment & Plan Note (Signed)
Continue antiacid therapy  ?

## 2023-07-12 NOTE — Progress Notes (Signed)
 Progress Note   Patient: Benjamin Woods ZOX:[id number] DOB: 03-10-1941 DOA: 07/10/2023     1 DOS: the patient was seen and examined on 07/12/2023   Brief hospital course: Mr. Dayrit was admitted to the hospital with the working diagnosis of heart failure exacerbation.   83 yo male with the past medical history of heart failure, hypertension, history of DVT and PE, GERD, dyslipidemia, and deforming gout arthritis who presented with dyspnea and lower extremity edema. He was diagnosed with volume overload at [facility] and was transferred to [facility] for further evaluation.  On his initial physical examination his blood pressure was 125/68, HR 59, RR 16 and 02 saturation 94%, lungs with no wheezing or rhonchi, heart with S1 and S2 present and regular, abdomen with no distention, positive lower extremity edema.   Na 134, K 4.8 Cl 104 bicarbonate 26, glucose 107 bun 27, cr 1,42 BNP 426  High sensitive troponin 10 and 10  Lactic acid 1,0  Wbc 4.3 hgb 12.0 plt 109   Sars covid 19 negative Influenza negative   Urine analysis SG 1,010, positive nitrates, small leukocytes, negative hgb, 0-5 wbc.   Chest radiograph with hypoinflation with bilateral hilar vascular congestion, with cephalization of the vasculature and fluid in the right fissure, bibasilar atelectasis.   EKG 57 bpm, normal axis, normal intervals, qtc 441 sinus rhythm with 1st degree AV block, no significant ST segment changes, negative T wave lead aVL.   Assessment and Plan: * Acute on chronic diastolic CHF (congestive heart failure) (HCC) Echocardiogram with mild reduction in LV systolic function EF 45 to 50%, mild dilatated LV cavity, RV systolic function preserved. LA with moderate dilatation, no significant valvular disease.  LV inferior basal septal hypokinesis,   Urine output is 1400 ml Systolic blood pressure 120 mmHg range.   Plan to continue diuresis with IV furosemide 40 mg bid Continue to hold on RAAS inhibition due to acute  worsening in GFR.  Hold on Carvedilol due to AV block.   HTN (hypertension) Continue blood pressure monitoring.  Holding on antihypertensive for now.   Mobitz type 1 second degree AV block Avoid AV blockers for now Continue telemetry monitoring   BPH (benign prostatic hyperplasia) No signs of urinary retention Urinary tract infection has been ruled out, discontinue all antibiotic therapy. Sepsis ruled out.   DVT (deep venous thrombosis) (HCC) Continue anticoagulation   GERD (gastroesophageal reflux disease) Continue antiacid therapy  Pancytopenia (HCC) Close follow up on cell count.     Subjective: Patient with improvement in his symptoms, specially in lower extremity edema, he continue to have dyspnea, no chest pain   Physical Exam: Vitals:   07/11/23 1900 07/12/23 0018 07/12/23 0337 07/12/23 0445  BP: 121/65 121/64 124/63   Pulse: 64 72 75   Resp: 18 18 17    Temp: 97.8 F (36.6 C) 98.3 F (36.8 C) 98.5 F (36.9 C)   TempSrc: Oral Oral Oral   SpO2: 95% 96% 94%   Weight:    105.9 kg  Height:       Neurology awake and alert ENT with mild pallor Cardiovascular with S1 and S2 present and regular with positive systolic murmur at the apex with no gallops, rubs or murmurs Respiratory with scattered rales with no wheezing.  Abdomen with no distention  Positive lower extremity edema +  Data Reviewed:    Family Communication: no family at the bedside   Disposition: Status is: Inpatient Remains inpatient appropriate because: IV diuresis   Planned Discharge  Destination: Home      Author: Coralie Keens, MD 07/12/2023 3:46 PM  For on call review [email or web address].

## 2023-07-12 NOTE — Progress Notes (Signed)
 Echocardiogram 2D Echocardiogram has been performed.  Warren Lacy Matteus Mcnelly RDCS 07/12/2023, 1:41 PM

## 2023-07-12 NOTE — Progress Notes (Signed)
 Heart Failure Navigator Progress Note  Assessed for Heart & Vascular TOC clinic readiness.  Patient has a scheduled [facility] appointment on 07/26/2023. No HF TOC .   Navigator will sign off at this time.   Rhae Hammock, BSN, Scientist, clinical (histocompatibility and immunogenetics) Only

## 2023-07-12 NOTE — Assessment & Plan Note (Addendum)
 No signs of urinary retention Urinary tract infection has been ruled out,. Sepsis ruled out.

## 2023-07-12 NOTE — Hospital Course (Addendum)
 Benjamin Woods was admitted to the hospital with the working diagnosis of heart failure exacerbation.   82 yo male with the past medical history of heart failure, hypertension, history of DVT and PE, GERD, dyslipidemia, and deforming gout arthritis who presented with dyspnea and lower extremity edema. He was diagnosed with volume overload at [facility] and was transferred to [facility] for further evaluation.  On his initial physical examination his blood pressure was 125/68, HR 59, RR 16 and 02 saturation 94%, lungs with no wheezing or rhonchi, heart with S1 and S2 present and regular, abdomen with no distention, positive lower extremity edema.   Na 134, K 4.8 Cl 104 bicarbonate 26, glucose 107 bun 27, cr 1,42 BNP 426  High sensitive troponin 10 and 10  Lactic acid 1,0  Wbc 4.3 hgb 12.0 plt 109   Sars covid 19 negative Influenza negative   Urine analysis SG 1,010, positive nitrates, small leukocytes, negative hgb, 0-5 wbc.   Chest radiograph with hypoinflation with bilateral hilar vascular congestion, with cephalization of the vasculature and fluid in the right fissure, bibasilar atelectasis.   EKG 57 bpm, normal axis, normal intervals, qtc 441 sinus rhythm with 1st degree AV block, no significant ST segment changes, negative T wave lead aVL.   03/08 responding well to diuresis.  03/09 transition to oral loop diuretics with plan for possible discharge home tomorrow.  03/10 continue needing supplemental 02 per [place], plan to further diurese today in attempt to wean off supplemental 02, possible discharge home tomorrow.

## 2023-07-12 NOTE — Evaluation (Signed)
 Physical Therapy Evaluation Patient Details Name: Benjamin Woods MRN: [id number] DOB: 03-31-1941 Today's Date: 07/12/2023  History of Present Illness  Benjamin Woods is a 83 y.o. M admitted 07/10/23 with sepsis. Cardiology consulted for evaluation of acute decompensated heart failure/bradycardia. 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  Clinical Impression  Pt admitted with above diagnosis. PTA, pt was ModI with functional mobility using a SPC prn, independent with ADLs/IADLs, and driving. He resides alone in an apartment with a level entry. Pt currently with functional limitations due to the deficits listed below (see PT Problem List). He required CGA-minA for all functional mobility. Pt is currently requiring supplemental oxygen. He desaturated to 81% on RA during activity, recovering to >90% on 2L. Pt will benefit from acute skilled PT to increase his independence and safety with mobility to allow discharge. Anticipate pt to progress well and be able to d/c Home with HHPT.     If plan is discharge home, recommend the following: A little help with walking and/or transfers;A little help with bathing/dressing/bathroom   Can travel by private vehicle        Equipment Recommendations None recommended by PT (Pt already has DME)  Recommendations for Other Services       Functional Status Assessment Patient has had a recent decline in their functional status and demonstrates the ability to make significant improvements in function in a reasonable and predictable amount of time.     Precautions / Restrictions Precautions Precautions: Fall Recall of Precautions/Restrictions: Intact Restrictions Weight Bearing Restrictions Per Provider Order: No      Mobility  Bed Mobility Overal bed mobility: Needs Assistance Bed Mobility: Supine to Sit     Supine to sit: HOB elevated, Used rails, Contact guard     General bed mobility comments: Pt took increased time to reach EOB and  required CGA at trunk.    Transfers Overall transfer level: Needs assistance Equipment used: Straight cane Transfers: Sit to/from Stand, Bed to chair/wheelchair/BSC Sit to Stand: Min assist, Contact guard assist   Step pivot transfers: Contact guard assist       General transfer comment: Pt stood from lowest bed height and completed bed<>chair transfer x2. Required minA to power up initially. Cued pt to increase fwd lean and use momentum, decreased to CGA. Good eccentric control with sitting.    Ambulation/Gait Ambulation/Gait assistance: Contact guard assist Gait Distance (Feet): 15 Feet (1x15 from around EOB to recliner chair, 2x3 from chair<>bed) Assistive device: Straight cane Gait Pattern/deviations: Step-through pattern, Decreased stride length, Trunk flexed, Wide base of support   Gait velocity interpretation: <1.8 ft/sec, indicate of risk for recurrent falls   General Gait Details: Pt ambulated with RUE holding SPC and intermittent furniture walking with LUE by reaching for bedrail. He took even steps with a WBOS.  Stairs            Wheelchair Mobility     Tilt Bed    Modified Rankin (Stroke Patients Only)       Balance Overall balance assessment: Needs assistance Sitting-balance support: No upper extremity supported, Feet supported Sitting balance-Leahy Scale: Fair Sitting balance - Comments: Pt sat EOB with supervision.   Standing balance support: Single extremity supported, Bilateral upper extremity supported, During functional activity Standing balance-Leahy Scale: Fair Standing balance comment: Pt stood with SPC and ambualted with SPC and intermittent furniture walking. He required CGA-minA for stability and maintains a WBOS for increased stability.  Pertinent Vitals/Pain Pain Assessment Pain Assessment: No/denies pain    Home Living Family/patient expects to be discharged to:: Private residence Living  Arrangements: Spouse/significant other;Alone (Wife is at a SNF currently, so he is alone right now) Available Help at Discharge: Family;Available PRN/intermittently Type of Home: Apartment Home Access: Level entry       Home Layout: One level Home Equipment: Cane - single Librarian, academic (2 wheels);Shower seat Additional Comments: Wife has been at a SNF for ~2 years. His daughter checks in ~2 weeks. A person comes in for cleaning every week.    Prior Function Prior Level of Function : Independent/Modified Independent;History of Falls (last six months)             Mobility Comments: Ambulates with prn use of cane. Denies fall history. ADLs Comments: Indep with ADLs. He reports holding onto shower chair when bathing, but rarely sits. Manages his medications, finances, and drives.     Extremity/Trunk Assessment   Upper Extremity Assessment Upper Extremity Assessment: Defer to OT evaluation    Lower Extremity Assessment Lower Extremity Assessment: Generalized weakness    Cervical / Trunk Assessment Cervical / Trunk Assessment: Normal  Communication   Communication Communication: No apparent difficulties    Cognition Arousal: Alert Behavior During Therapy: WFL for tasks assessed/performed   PT - Cognitive impairments: No apparent impairments                       PT - Cognition Comments: PT A,Ox4 Following commands: Intact       Cueing Cueing Techniques: Verbal cues, Tactile cues     General Comments General comments (skin integrity, edema, etc.): Pt greeted on 2L O2 via [place]. Attempted to titrate to RA and pt desaturated to 81% unable to recover with rest and cues for PLB. Redonned 2L and pt's SpO2 >90%.    Exercises     Assessment/Plan    PT Assessment Patient needs continued PT services  PT Problem List Decreased strength;Decreased activity tolerance;Decreased balance;Decreased mobility       PT Treatment Interventions DME instruction;Gait  training;Stair training;Functional mobility training;Therapeutic activities;Therapeutic exercise;Balance training;Patient/family education    PT Goals (Current goals can be found in the Care Plan section)  Acute Rehab PT Goals Patient Stated Goal: Return Home PT Goal Formulation: With patient Time For Goal Achievement: 07/26/23 Potential to Achieve Goals: Good    Frequency Min 2X/week     Co-evaluation               AM-PAC PT "6 Clicks" Mobility  Outcome Measure Help needed turning from your back to your side while in a flat bed without using bedrails?: A Little Help needed moving from lying on your back to sitting on the side of a flat bed without using bedrails?: A Little Help needed moving to and from a bed to a chair (including a wheelchair)?: A Little Help needed standing up from a chair using your arms (e.g., wheelchair or bedside chair)?: A Little Help needed to walk in hospital room?: A Lot Help needed climbing 3-5 steps with a railing? : A Lot 6 Click Score: 16    End of Session Equipment Utilized During Treatment: Gait belt;Oxygen Activity Tolerance: Patient tolerated treatment well Patient left: in chair;with call bell/phone within reach;with chair alarm set Nurse Communication: Mobility status PT Visit Diagnosis: Muscle weakness (generalized) (M62.81);Unsteadiness on feet (R26.81);Other abnormalities of gait and mobility (R26.89)    Time: 1610-9604 PT Time Calculation (min) (ACUTE ONLY): 31  min   Charges:   PT Evaluation $PT Eval Moderate Complexity: 1 Mod PT Treatments $Therapeutic Activity: 8-22 mins PT General Charges $$ ACUTE PT VISIT: 1 Visit         Cheri Guppy, PT, DPT Acute Rehabilitation Services Office: [phone] Secure Chat Preferred  Richardson Chiquito 07/12/2023, 10:57 AM

## 2023-07-13 ENCOUNTER — Inpatient Hospital Stay (HOSPITAL_COMMUNITY)

## 2023-07-13 DIAGNOSIS — R609 Edema, unspecified: Secondary | ICD-10-CM

## 2023-07-13 DIAGNOSIS — I10 Essential (primary) hypertension: Secondary | ICD-10-CM | POA: Diagnosis not present

## 2023-07-13 DIAGNOSIS — N179 Acute kidney failure, unspecified: Secondary | ICD-10-CM | POA: Diagnosis not present

## 2023-07-13 DIAGNOSIS — I5033 Acute on chronic diastolic (congestive) heart failure: Secondary | ICD-10-CM | POA: Diagnosis not present

## 2023-07-13 DIAGNOSIS — I441 Atrioventricular block, second degree: Secondary | ICD-10-CM | POA: Diagnosis not present

## 2023-07-13 DIAGNOSIS — N1831 Chronic kidney disease, stage 3a: Secondary | ICD-10-CM

## 2023-07-13 LAB — BASIC METABOLIC PANEL
Anion gap: 6 (ref 5–15)
BUN: 29 mg/dL — ABNORMAL HIGH (ref 8–23)
CO2: 29 mmol/L (ref 22–32)
Calcium: 8.6 mg/dL — ABNORMAL LOW (ref 8.9–10.3)
Chloride: 102 mmol/L (ref 98–111)
Creatinine, Ser: 1.93 mg/dL — ABNORMAL HIGH (ref 0.61–1.24)
GFR, Estimated: 34 mL/min — ABNORMAL LOW (ref >60)
Glucose, Bld: 103 mg/dL — ABNORMAL HIGH (ref 70–99)
Potassium: 3.7 mmol/L (ref 3.5–5.1)
Sodium: 137 mmol/L (ref 135–145)

## 2023-07-13 LAB — CBC
HCT: 38 % — ABNORMAL LOW (ref 39.0–52.0)
Hemoglobin: 12.1 g/dL — ABNORMAL LOW (ref 13.0–17.0)
MCH: 27.1 pg (ref 26.0–34.0)
MCHC: 31.8 g/dL (ref 30.0–36.0)
MCV: 85.2 fL (ref 80.0–100.0)
Platelets: 104 10*3/uL — ABNORMAL LOW (ref 150–400)
RBC: 4.46 MIL/uL (ref 4.22–5.81)
RDW: 15.4 % (ref 11.5–15.5)
WBC: 5.7 10*3/uL (ref 4.0–10.5)
nRBC: 0 % (ref 0.0–0.2)

## 2023-07-13 LAB — MAGNESIUM: Magnesium: 2 mg/dL (ref 1.7–2.4)

## 2023-07-13 MED ORDER — POTASSIUM CHLORIDE CRYS ER 20 MEQ PO TBCR
20.0000 meq | EXTENDED_RELEASE_TABLET | Freq: Once | ORAL | Status: AC
  Administered 2023-07-13: 20 meq via ORAL
  Filled 2023-07-13: qty 1

## 2023-07-13 MED ORDER — POLYETHYLENE GLYCOL 3350 17 G PO PACK
17.0000 g | PACK | Freq: Two times a day (BID) | ORAL | Status: DC
  Administered 2023-07-13 – 2023-07-16 (×7): 17 g via ORAL
  Filled 2023-07-13 (×7): qty 1

## 2023-07-13 MED ORDER — BISACODYL 5 MG PO TBEC
5.0000 mg | DELAYED_RELEASE_TABLET | Freq: Once | ORAL | Status: AC
  Administered 2023-07-13: 5 mg via ORAL
  Filled 2023-07-13: qty 1

## 2023-07-13 NOTE — Progress Notes (Signed)
   Patient Name: Benjamin Woods Date of Encounter: 07/13/2023 [facility] Cardiologist: Gypsy Balsam, MD   Interval Summary  .    Breathing better but still at baseline, was able to get socks on this morning.  Edema has improved.  Working with physical therapy currently.  Just stood up out of chair.  Frail.  Vital Signs .    Vitals:   07/13/23 0440 07/13/23 0441 07/13/23 0442 07/13/23 0712  BP: 107/84   123/66  Pulse: 74 77 76 74  Resp: 18   18  Temp: 98.1 F (36.7 C)   98.1 F (36.7 C)  TempSrc: Oral   Oral  SpO2: 93% 97% 96% 92%  Weight:   104.4 kg   Height:        Intake/Output Summary (Last 24 hours) at 07/13/2023 1015 Last data filed at 07/13/2023 1014 Gross per 24 hour  Intake 953 ml  Output 2450 ml  Net -1497 ml      07/13/2023    4:42 AM 07/12/2023    4:45 AM 07/11/2023    1:50 PM  Last 3 Weights  Weight (lbs) 230 lb 2.6 oz 233 lb 7.5 oz 232 lb 12.9 oz  Weight (kg) 104.4 kg 105.9 kg 105.6 kg      Telemetry/ECG    No adverse arrhythmias; occasional PACs - Personally Reviewed  Physical Exam .   GEN: No acute distress.   Neck: No JVD Cardiac: RRR, no murmurs, rubs, or gallops.  Respiratory: Mild crackles at bases bilaterally. GI: Soft, nontender, non-distended  MS: 1+ LE edema  Assessment & Plan .     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 -BNP 426, NYHA class III stage C -Now on Lasix 40 mg IV twice daily, 1.6 L out yesterday. -Creatinine has increased from 1.25-1.71->1.93 but was 1.14 on admission.  Still has more fluid to diurese. Though may need to pause diuresis today to allow some re-equilibration. Repeat creatinine tomorrow. -Previously stopped amlodipine which can contribute to lower extremity edema  Bradycardia - Improved.  Second-degree AV block type I previously noted -At home patient was taking carvedilol 25 mg twice a day.  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      For questions or updates, please contact [facility] Please consult [email or web address] for contact info under        Signed, Maurice Small, MD

## 2023-07-13 NOTE — Plan of Care (Signed)
  Problem: Clinical Measurements: Goal: Respiratory complications will improve Outcome: Progressing   Problem: Clinical Measurements: Goal: Diagnostic test results will improve Outcome: Progressing   Problem: Nutrition: Goal: Adequate nutrition will be maintained Outcome: Progressing   Problem: Pain Managment: Goal: General experience of comfort will improve and/or be controlled Outcome: Progressing   Problem: Activity: Goal: Capacity to carry out activities will improve Outcome: Progressing

## 2023-07-13 NOTE — Progress Notes (Addendum)
 Progress Note   Patient: Benjamin Woods:[id number] DOB: 02-26-1941 DOA: 07/10/2023     2 DOS: the patient was seen and examined on 07/13/2023   Brief hospital course: Mr. Powe was admitted to the hospital with the working diagnosis of heart failure exacerbation.   83 yo male with the past medical history of heart failure, hypertension, history of DVT and PE, GERD, dyslipidemia, and deforming gout arthritis who presented with dyspnea and lower extremity edema. He was diagnosed with volume overload at [facility] and was transferred to [facility] for further evaluation.  On his initial physical examination his blood pressure was 125/68, HR 59, RR 16 and 02 saturation 94%, lungs with no wheezing or rhonchi, heart with S1 and S2 present and regular, abdomen with no distention, positive lower extremity edema.   Na 134, K 4.8 Cl 104 bicarbonate 26, glucose 107 bun 27, cr 1,42 BNP 426  High sensitive troponin 10 and 10  Lactic acid 1,0  Wbc 4.3 hgb 12.0 plt 109   Sars covid 19 negative Influenza negative   Urine analysis SG 1,010, positive nitrates, small leukocytes, negative hgb, 0-5 wbc.   Chest radiograph with hypoinflation with bilateral hilar vascular congestion, with cephalization of the vasculature and fluid in the right fissure, bibasilar atelectasis.   EKG 57 bpm, normal axis, normal intervals, qtc 441 sinus rhythm with 1st degree AV block, no significant ST segment changes, negative T wave lead aVL.   03/08 responding well to diuresis.   Assessment and Plan: * Acute on chronic diastolic CHF (congestive heart failure) (HCC) Echocardiogram with mild reduction in LV systolic function EF 45 to 50%, mild dilatated LV cavity, RV systolic function preserved. LA with moderate dilatation, no significant valvular disease.  LV inferior basal septal hypokinesis,   Urine output is 2,200 ml Systolic blood pressure 107 mmHg range.   On hold furosemide for now, per cardiology recommendations.   Continue to hold on RAAS inhibition due to acute worsening in GFR.  Hold on Carvedilol due to AV block.   HTN (hypertension) Continue blood pressure monitoring.  Holding on antihypertensive for now.   Mobitz type 1 second degree AV block Avoid AV blockers for now Continue telemetry monitoring   Acute kidney injury superimposed on stage 3a chronic kidney disease (HCC) Renal function with serum cr at 1,93 with K at 3,7 and serum bicarbonate at 29  Na 137 and Mg 2.0   Patient continue volume overload and needs continue diuresis.  Add 20 kcl to prevent hypokalemia Follow up renal function in am, avoid hypotension and nephrotoxic medications.   DVT (deep venous thrombosis) (HCC) Continue anticoagulation   GERD (gastroesophageal reflux disease) Continue antiacid therapy  Pancytopenia (HCC) Close follow up on cell count.   BPH (benign prostatic hyperplasia) No signs of urinary retention Urinary tract infection has been ruled out, discontinue all antibiotic therapy. Sepsis ruled out.     Subjective: Patient with improvement in dyspnea and edema but not back to baseline. No chest pain   Physical Exam: Vitals:   07/13/23 0441 07/13/23 0442 07/13/23 0712 07/13/23 1050  BP:   123/66 107/68  Pulse: 77 76 74 71  Resp:   18 16  Temp:   98.1 F (36.7 C) 98 F (36.7 C)  TempSrc:   Oral Oral  SpO2: 97% 96% 92% 93%  Weight:  104.4 kg    Height:       Neurology awake and alert ENT with mild pallor Cardiovascular with S1 and S2 present  and regular with positive systolic murmur at the right lower sternal border Positive moderate JVD Positive lower extremity edema ++ Respiratory with scattered rales with no wheezing or rhonchi Abdomen with distention and tympanic to percussion  Data Reviewed:    Family Communication: no family at the bedside   Disposition: Status is: Inpatient Remains inpatient appropriate because: IV diuresis   Planned Discharge Destination:  Home    Author: Coralie Keens, MD 07/13/2023 12:59 PM  For on call review [email or web address].

## 2023-07-13 NOTE — Plan of Care (Signed)

## 2023-07-13 NOTE — Progress Notes (Signed)
 VASCULAR LAB    ABI has been performed.  See CV proc for preliminary results.   Semira Stoltzfus, RVT 07/13/2023, 12:29 PM

## 2023-07-13 NOTE — Assessment & Plan Note (Addendum)
 Today renal function with worsening serum cr at 1,8 with K at 4,5 and serum bicarbonate at 30  Na 140   Continue diuresis for today, hopefully can wean off to room air from [place].  Follow up renal function and electrolytes in am.

## 2023-07-14 DIAGNOSIS — I5033 Acute on chronic diastolic (congestive) heart failure: Secondary | ICD-10-CM | POA: Diagnosis not present

## 2023-07-14 DIAGNOSIS — N179 Acute kidney failure, unspecified: Secondary | ICD-10-CM | POA: Diagnosis not present

## 2023-07-14 DIAGNOSIS — I441 Atrioventricular block, second degree: Secondary | ICD-10-CM | POA: Diagnosis not present

## 2023-07-14 DIAGNOSIS — I10 Essential (primary) hypertension: Secondary | ICD-10-CM | POA: Diagnosis not present

## 2023-07-14 LAB — BASIC METABOLIC PANEL
Anion gap: 5 (ref 5–15)
BUN: 32 mg/dL — ABNORMAL HIGH (ref 8–23)
CO2: 29 mmol/L (ref 22–32)
Calcium: 8.5 mg/dL — ABNORMAL LOW (ref 8.9–10.3)
Chloride: 104 mmol/L (ref 98–111)
Creatinine, Ser: 1.74 mg/dL — ABNORMAL HIGH (ref 0.61–1.24)
GFR, Estimated: 38 mL/min — ABNORMAL LOW (ref >60)
Glucose, Bld: 117 mg/dL — ABNORMAL HIGH (ref 70–99)
Potassium: 4.3 mmol/L (ref 3.5–5.1)
Sodium: 138 mmol/L (ref 135–145)

## 2023-07-14 LAB — VAS US ABI WITH/WO TBI
Left ABI: 0.88
Right ABI: 0.92

## 2023-07-14 MED ORDER — FUROSEMIDE 40 MG PO TABS
40.0000 mg | ORAL_TABLET | Freq: Every day | ORAL | Status: DC
  Administered 2023-07-14 – 2023-07-16 (×3): 40 mg via ORAL
  Filled 2023-07-14 (×3): qty 1

## 2023-07-14 MED ORDER — CARVEDILOL 12.5 MG PO TABS
12.5000 mg | ORAL_TABLET | Freq: Two times a day (BID) | ORAL | Status: DC
Start: 2023-07-14 — End: 2023-07-15
  Administered 2023-07-14 – 2023-07-15 (×3): 12.5 mg via ORAL
  Filled 2023-07-14 (×3): qty 1

## 2023-07-14 NOTE — Plan of Care (Signed)
   Problem: Clinical Measurements: Goal: Diagnostic test results will improve Outcome: Progressing   Problem: Activity: Goal: Risk for activity intolerance will decrease Outcome: Progressing

## 2023-07-14 NOTE — Progress Notes (Signed)
 Progress Note   Patient: Benjamin Woods:[id number] DOB: 1940-12-12 DOA: 07/10/2023     3 DOS: the patient was seen and examined on 07/14/2023   Brief hospital course: Mr. Mixson was admitted to the hospital with the working diagnosis of heart failure exacerbation.   83 yo male with the past medical history of heart failure, hypertension, history of DVT and PE, GERD, dyslipidemia, and deforming gout arthritis who presented with dyspnea and lower extremity edema. He was diagnosed with volume overload at [facility] and was transferred to [facility] for further evaluation.  On his initial physical examination his blood pressure was 125/68, HR 59, RR 16 and 02 saturation 94%, lungs with no wheezing or rhonchi, heart with S1 and S2 present and regular, abdomen with no distention, positive lower extremity edema.   Na 134, K 4.8 Cl 104 bicarbonate 26, glucose 107 bun 27, cr 1,42 BNP 426  High sensitive troponin 10 and 10  Lactic acid 1,0  Wbc 4.3 hgb 12.0 plt 109   Sars covid 19 negative Influenza negative   Urine analysis SG 1,010, positive nitrates, small leukocytes, negative hgb, 0-5 wbc.   Chest radiograph with hypoinflation with bilateral hilar vascular congestion, with cephalization of the vasculature and fluid in the right fissure, bibasilar atelectasis.   EKG 57 bpm, normal axis, normal intervals, qtc 441 sinus rhythm with 1st degree AV block, no significant ST segment changes, negative T wave lead aVL.   03/08 responding well to diuresis.  03/09 transition to oral loop diuretics with plan for possible discharge home tomorrow.   Assessment and Plan: * Acute on chronic diastolic CHF (congestive heart failure) (HCC) Echocardiogram with mild reduction in LV systolic function EF 45 to 50%, mild dilatated LV cavity, RV systolic function preserved. LA with moderate dilatation, no significant valvular disease.  LV inferior basal septal hypokinesis,   Urine output is 1,575 ml Systolic blood  pressure 120 mmHg range.   Resume oral furosemide 40 mg po daily.  Continue to hold on RAAS inhibition due to acute worsening in GFR.  Will resume low dose carvedilol, patient having non conducting PAC.   HTN (hypertension) Continue blood pressure monitoring.  Resume low dose carvedilol.   Mobitz type 1 second degree AV block Avoid AV blockers for now Continue telemetry monitoring  Continue carvedilol.   Acute kidney injury superimposed on stage 3a chronic kidney disease (HCC) Renal function with improving serum cr down to 1,74 with K at 4,3 and serum bicarbonate at 29  Na 138  Plan to resume diuresis with oral furosemide.  Add ted hose for peripheral edema.   DVT (deep venous thrombosis) (HCC) Continue anticoagulation   GERD (gastroesophageal reflux disease) Continue antiacid therapy  Pancytopenia (HCC) Wbc has recovered to 5,7 continue to have anemia at 12.1 and thrombocytopenia 104  Follow up cell count as outpatient.   BPH (benign prostatic hyperplasia) No signs of urinary retention Urinary tract infection has been ruled out,. Sepsis ruled out.    Subjective: Patient with no chest pain, dyspnea and edema continue to improve, no chest pain, no PND or orthopnea   Physical Exam: Vitals:   07/13/23 2329 07/14/23 0341 07/14/23 0802 07/14/23 1118  BP: 124/75 (!) 117/58 121/71 118/64  Pulse: 72 72 60 71  Resp: 20 18 18 20   Temp: 98 F (36.7 C) 97.8 F (36.6 C) 97.9 F (36.6 C) 97.6 F (36.4 C)  TempSrc: Oral Oral Oral Oral  SpO2: 95% 95% 96% 94%  Weight:  104.4 kg  Height:       Neurology awake and alert ENT with mild pallor Cardiovascular with S1 and S2 present and regular with no gallops, rubs or murmurs Respiratory with no rales or wheezing, no rhonchi Abdomen with no distention  Distal left lower extremity edema +   Data Reviewed:    Family Communication: no family at the bedside   Disposition: Status is: Inpatient Remains inpatient  appropriate because: recovering heart failure, possible discharge home tomorrow  Planned Discharge Destination: Home    Author: Coralie Keens, MD 07/14/2023 3:05 PM  For on call review [email or web address].

## 2023-07-14 NOTE — Progress Notes (Signed)
   07/14/23 1152  Mobility  Activity Ambulated with assistance in hallway  Level of Assistance Standby assist, set-up cues, supervision of patient - no hands on  Assistive Device Cane  Distance Ambulated (ft) 80 ft  Activity Response Tolerated fair  Mobility Referral Yes  Mobility visit 1 Mobility  Mobility Specialist Start Time (ACUTE ONLY) 1140  Mobility Specialist Stop Time (ACUTE ONLY) 1152  Mobility Specialist Time Calculation (min) (ACUTE ONLY) 12 min   Mobility Specialist: Progress Note During Mobility: SpO2 91-97%  Pt agreeable to mobility session - received in chair. Pt was asymptomatic throughout session with no complaints. Returned to chair with all needs met - call bell within reach.   Barnie Mort, BS Mobility Specialist Please contact via SecureChat or  Rehab office at [phone].

## 2023-07-14 NOTE — Progress Notes (Signed)
   Patient Name: Benjamin Woods Date of Encounter: 07/14/2023 [facility] Cardiologist: Gypsy Balsam, MD   Interval Summary  .    Breathing better but still at baseline, was able to get socks on this morning.  Edema has improved.  Working with physical therapy currently.  Just stood up out of chair.  Frail.  Vital Signs .    Vitals:   07/13/23 1951 07/13/23 2329 07/14/23 0341 07/14/23 0802  BP: 121/69 124/75 (!) 117/58 121/71  Pulse: 68 72 72 60  Resp: 18 20 18 18   Temp: 97.8 F (36.6 C) 98 F (36.7 C) 97.8 F (36.6 C) 97.9 F (36.6 C)  TempSrc: Oral Oral Oral Oral  SpO2: 95% 95% 95% 96%  Weight:   104.4 kg   Height:        Intake/Output Summary (Last 24 hours) at 07/14/2023 1029 Last data filed at 07/14/2023 0800 Gross per 24 hour  Intake 894 ml  Output 1625 ml  Net -731 ml      07/14/2023    3:41 AM 07/13/2023    4:42 AM 07/12/2023    4:45 AM  Last 3 Weights  Weight (lbs) 230 lb 3.2 oz 230 lb 2.6 oz 233 lb 7.5 oz  Weight (kg) 104.418 kg 104.4 kg 105.9 kg      Telemetry/ECG    No adverse arrhythmias; occasional PACs - Personally Reviewed  Physical Exam .   GEN: No acute distress.   Neck: No JVD Cardiac: RRR, no murmurs, rubs, or gallops.  Respiratory: Mild crackles at bases bilaterally. GI: Soft, nontender, non-distended  MS: 1+ LE edema  Assessment & Plan .     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 -BNP 426, NYHA class III stage C -Now on Lasix 40 mg IV twice daily, 1.6 L out yesterday. -Creatinine has increased from 1.25-1.71->1.93 -> 1.74 (1.14 on admission).  Creatinine improved with pause in diuretics yesterday -- pt still appears to have some edema. Goal at this point can be net slight negative UOP; can switch to PO furosemide  -Previously stopped amlodipine which can contribute to lower extremity edema  Bradycardia - Improved.  Second-degree AV block type I previously noted -- blocked PACs noted on telemetry over past 48  hours, not wenckebach -At home patient was taking carvedilol 25 mg twice a day.  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      For questions or updates, please contact [facility] Please consult [email or web address] for contact info under        Signed, Maurice Small, MD

## 2023-07-15 ENCOUNTER — Inpatient Hospital Stay (HOSPITAL_COMMUNITY)

## 2023-07-15 DIAGNOSIS — R001 Bradycardia, unspecified: Secondary | ICD-10-CM | POA: Diagnosis not present

## 2023-07-15 DIAGNOSIS — I441 Atrioventricular block, second degree: Secondary | ICD-10-CM | POA: Diagnosis not present

## 2023-07-15 DIAGNOSIS — I10 Essential (primary) hypertension: Secondary | ICD-10-CM | POA: Diagnosis not present

## 2023-07-15 DIAGNOSIS — I509 Heart failure, unspecified: Secondary | ICD-10-CM

## 2023-07-15 DIAGNOSIS — I5033 Acute on chronic diastolic (congestive) heart failure: Secondary | ICD-10-CM | POA: Diagnosis not present

## 2023-07-15 DIAGNOSIS — I824Z2 Acute embolism and thrombosis of unspecified deep veins of left distal lower extremity: Secondary | ICD-10-CM | POA: Diagnosis not present

## 2023-07-15 LAB — BASIC METABOLIC PANEL
Anion gap: 7 (ref 5–15)
BUN: 35 mg/dL — ABNORMAL HIGH (ref 8–23)
CO2: 30 mmol/L (ref 22–32)
Calcium: 8.7 mg/dL — ABNORMAL LOW (ref 8.9–10.3)
Chloride: 103 mmol/L (ref 98–111)
Creatinine, Ser: 1.82 mg/dL — ABNORMAL HIGH (ref 0.61–1.24)
GFR, Estimated: 36 mL/min — ABNORMAL LOW (ref >60)
Glucose, Bld: 94 mg/dL (ref 70–99)
Potassium: 4.5 mmol/L (ref 3.5–5.1)
Sodium: 140 mmol/L (ref 135–145)

## 2023-07-15 MED ORDER — FUROSEMIDE 10 MG/ML IJ SOLN
80.0000 mg | Freq: Two times a day (BID) | INTRAMUSCULAR | Status: AC
  Administered 2023-07-15 – 2023-07-16 (×2): 80 mg via INTRAVENOUS
  Filled 2023-07-15 (×2): qty 8

## 2023-07-15 MED ORDER — CARVEDILOL 3.125 MG PO TABS
3.1250 mg | ORAL_TABLET | Freq: Two times a day (BID) | ORAL | Status: DC
  Administered 2023-07-16: 3.125 mg via ORAL
  Filled 2023-07-15: qty 1

## 2023-07-15 NOTE — Progress Notes (Signed)
   Patient Name: Benjamin Woods Date of Encounter: 07/15/2023  [facility] Cardiologist: Gypsy Balsam, MD   Interval Summary  .    Sitting up in a chair.  He states that he thinks he is close to his baseline  Vital Signs .    Vitals:   07/14/23 2012 07/14/23 2321 07/15/23 0505 07/15/23 0715  BP: 123/70 116/66 131/67 121/66  Pulse:  72 72 63  Resp: 18 19 20 19   Temp: 97.6 F (36.4 C) 97.8 F (36.6 C) 98.7 F (37.1 C) 97.6 F (36.4 C)  TempSrc:  Oral Oral Oral  SpO2: 94% 93% 90% 92%  Weight:   103.9 kg   Height:        Intake/Output Summary (Last 24 hours) at 07/15/2023 1026 Last data filed at 07/15/2023 0832 Gross per 24 hour  Intake 1017 ml  Output 1345 ml  Net -328 ml      07/15/2023    5:05 AM 07/14/2023    3:41 AM 07/13/2023    4:42 AM  Last 3 Weights  Weight (lbs) 229 lb 0.9 oz 230 lb 3.2 oz 230 lb 2.6 oz  Weight (kg) 103.9 kg 104.418 kg 104.4 kg      Telemetry/ECG    No adverse arrhythmias; occasional PACs - Personally Reviewed  Physical Exam .   GEN: No acute distress.   Neck: No JVD at 90 degrees Cardiac: RRR, no murmurs, rubs, or gallops.  Respiratory: Mild crackles at bases bilaterally. GI: Soft, nontender, non-distended  MS: mild LE edema  Assessment & Plan .     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 -BNP 426, NYHA class III stage C -s/p IV lasix; continue current oral Lasix -Creatinine has increased from 1.25-1.71->1.93 -> 1.74->1.8 (1.14 on admission).  Creatinine stable -Previously stopped amlodipine which can contribute to lower extremity edema  Bradycardia - Improved.  Second-degree AV block type I previously noted -- blocked PACs noted on telemetry over past 48 hours, not wenckebach -At home patient was taking carvedilol 25 mg twice a day.  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   Would aim to wean his O2.  If he can ambulate without any oxygen needs, then he is suitable for discharge from a cardiology standpoint.    For questions or updates, please contact  [facility] Please consult [email or web address] for contact info under        Signed, Jovahn Breit, Alben Spittle, MD

## 2023-07-15 NOTE — Progress Notes (Addendum)
 Progress Note   Patient: Benjamin Woods:[id number] DOB: 1941/02/01 DOA: 07/10/2023     4 DOS: the patient was seen and examined on 07/15/2023   Brief hospital course: Benjamin Woods was admitted to the hospital with the working diagnosis of heart failure exacerbation.   83 yo male with the past medical history of heart failure, hypertension, history of DVT and PE, GERD, dyslipidemia, and deforming gout arthritis who presented with dyspnea and lower extremity edema. He was diagnosed with volume overload at [facility] and was transferred to [facility] for further evaluation.  On his initial physical examination his blood pressure was 125/68, HR 59, RR 16 and 02 saturation 94%, lungs with no wheezing or rhonchi, heart with S1 and S2 present and regular, abdomen with no distention, positive lower extremity edema.   Na 134, K 4.8 Cl 104 bicarbonate 26, glucose 107 bun 27, cr 1,42 BNP 426  High sensitive troponin 10 and 10  Lactic acid 1,0  Wbc 4.3 hgb 12.0 plt 109   Sars covid 19 negative Influenza negative   Urine analysis SG 1,010, positive nitrates, small leukocytes, negative hgb, 0-5 wbc.   Chest radiograph with hypoinflation with bilateral hilar vascular congestion, with cephalization of the vasculature and fluid in the right fissure, bibasilar atelectasis.   EKG 57 bpm, normal axis, normal intervals, qtc 441 sinus rhythm with 1st degree AV block, no significant ST segment changes, negative T wave lead aVL.   03/08 responding well to diuresis.  03/09 transition to oral loop diuretics with plan for possible discharge home tomorrow.  03/10 continue needing supplemental 02 per [place], plan to further diurese today in attempt to wean off supplemental 02, possible discharge home tomorrow.   Assessment and Plan: * Acute on chronic diastolic CHF (congestive heart failure) (HCC) Echocardiogram with mild reduction in LV systolic function EF 45 to 50%, mild dilatated LV cavity, RV systolic function  preserved. LA with moderate dilatation, no significant valvular disease.  LV inferior basal septal hypokinesis,   Urine output is 1,645 ml He has lost 8 Kg since admission.  Systolic blood pressure 120 mmHg range.   Patient not able to be wean to room air from [place].  Follow up chest radiograph personally reviewed, hypoinflation, with cardiomegaly and improvement in vascular congestion.   Will add 2 doses of IV furosemide in intention to wean him to room air before his discharge.  Continue low dose carvedilol, patient having non conducting PAC.  Holding RAAS until renal function more stable.   HTN (hypertension) Continue blood pressure monitoring.  Continue with low dose carvedilol.   Mobitz type 1 second degree AV block Telemetry with blocked PAC, not signs of AV block, plan to resume low dose carvedilol at 3,125 mg bid.  Continue telemetry monitoring.    Acute kidney injury superimposed on stage 3a chronic kidney disease (HCC) Today renal function with worsening serum cr at 1,8 with K at 4,5 and serum bicarbonate at 30  Na 140   Continue diuresis for today, hopefully can wean off to room air from .  Follow up renal function and electrolytes in am.   DVT (deep venous thrombosis) (HCC) Continue anticoagulation   GERD (gastroesophageal reflux disease) Continue antiacid therapy  Pancytopenia (HCC) Wbc has recovered to 5,7 continue to have anemia at 12.1 and thrombocytopenia 104  Follow up cell count as outpatient.   BPH (benign prostatic hyperplasia) No signs of urinary retention Urinary tract infection has been ruled out,. Sepsis ruled out.  Subjective: Patient continue to have dyspnea on exertion, not able to wean off supplemental 02 per [place], edema has improved he now has tedd hose.   Physical Exam: Vitals:   07/15/23 0505 07/15/23 0715 07/15/23 1041 07/15/23 1501  BP: 131/67 121/66 121/65 113/74  Pulse: 72 63 68 70  Resp: 20 19 18 20   Temp: 98.7 F (37.1 C)  97.6 F (36.4 C) (!) 97.5 F (36.4 C) (!) 97.3 F (36.3 C)  TempSrc: Oral Oral Oral Oral  SpO2: 90% 92% 96% 96%  Weight: 103.9 kg     Height:       Neurology awake and alert, deconditioned ENT with mild pallor with no icterus Cardiovascular with S1 and S2 present and regular with no gallops or rubs, mild systolic murmur at the apex Mild JVD Trace lower extremity edema Respiratory with positive rales at bases bilaterally with no wheezing or rhonchi Abdomen with no distention  Data Reviewed:    Family Communication: no family at the bedside   Disposition: Status is: Inpatient Remains inpatient appropriate because: IV diuresis in attempt to wean off supplemental 02   Planned Discharge Destination: Home   Author: Coralie Keens, MD 07/15/2023 3:47 PM  For on call review [email or web address].

## 2023-07-15 NOTE — Plan of Care (Signed)
  Problem: Health Behavior/Discharge Planning: Goal: Ability to manage health-related needs will improve Outcome: Progressing   Problem: Clinical Measurements: Goal: Will remain free from infection Outcome: Progressing Goal: Diagnostic test results will improve Outcome: Progressing Goal: Respiratory complications will improve Outcome: Progressing Goal: Cardiovascular complication will be avoided Outcome: Progressing   

## 2023-07-15 NOTE — Progress Notes (Signed)
 Occupational Therapy Treatment Patient Details Name: Benjamin Woods MRN: [id number] DOB: Jun 08, 1940 Today's Date: 07/15/2023   History of present illness Benjamin Woods is a 83 y.o. M admitted 07/10/23 with sepsis. Cardiology consulted for evaluation of acute decompensated heart failure/bradycardia. 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   OT comments  Pt currently at supervision to modified independent level for selfcare tasks, toileting, simulated tub transfers and mobility with use of a single point cane.  Oxygen sats maintained above 92% on 2Ls nasal cannula.  Pt provided with education on energy conservation techniques with handout given as well. Feel he is making steady progress with OT at this time.  Recommend continued acute care OT to progress balance, endurance, and independence so pt can return home at a safe level as he lives alone and will only have occasional assist from his daughter and his brother.  Recommend HHOT eval for safety and continued progress once discharged.       If plan is discharge home, recommend the following:  Assistance with cooking/housework   Equipment Recommendations  None recommended by OT       Precautions / Restrictions Precautions Precautions: Fall Recall of Precautions/Restrictions: Intact Restrictions Weight Bearing Restrictions Per Provider Order: No       Mobility Bed Mobility Overal bed mobility: Needs Assistance             General bed mobility comments: Pt already sitting up in the recliner to start session.    Transfers   Equipment used: Straight cane Transfers: Sit to/from Stand Sit to Stand: Modified independent (Device/Increase time)     Step pivot transfers: Supervision     General transfer comment: Supervision for ambulation with the single point cane to and from the bathroom.  Pt needs UE support for turning and stepping.     Balance Overall balance assessment: Needs assistance Sitting-balance  support: No upper extremity supported, Feet supported Sitting balance-Leahy Scale: Fair     Standing balance support: Single extremity supported, Bilateral upper extremity supported, During functional activity Standing balance-Leahy Scale: Fair Standing balance comment: Pt needs use of a cane for mobility but can stand statically during grooming tasks.                           ADL either performed or assessed with clinical judgement   ADL Overall ADL's : Needs assistance/impaired     Grooming: Wash/dry hands;Supervision/safety;Standing               Lower Body Dressing: Supervision/safety;Sit to/from stand Lower Body Dressing Details (indicate cue type and reason): Supervision with increased time for donning compression stockings. Toilet Transfer: Supervision/safety;Ambulation Toilet Transfer Details (indicate cue type and reason): single point cane Toileting- Clothing Manipulation and Hygiene: Supervision/safety Toileting - Clothing Manipulation Details (indicate cue type and reason): standing to urinate Tub/ Shower Transfer: Supervision/safety;Tub transfer;Ambulation;Shower Field seismologist Details (indicate cue type and reason): simulated stepping over the edge of the tub Functional mobility during ADLs: Supervision/safety General ADL Comments: Provided handout and education on the "Five Ps of Energy Conservation".  Also practiced toilet transfer and simulated shower/tub transfer with use of the single point cane.  Pt able to complete with supervision.  Encouraged use of the shower seat he has at home with education on sitting to complete all drying once finished with the shower and to dry off his feet in sitting and the floor of the shower to reduce risk  of falls when stepping out.  Pt's O2 sats maintained at 92% or better on 2Ls during ambulation and transfers during session.  Provided visual education on possilbe AE options for donning TEDS.  Attempted  sockaide during session but it was not successful.  Pt can cross LEs to donn them at this time, but uses a lot of energy to complete.     Communication Communication Communication: No apparent difficulties   Cognition Arousal: Alert Behavior During Therapy: WFL for tasks assessed/performed                                 Following commands: Intact        Cueing   Cueing Techniques: Verbal cues  Exercises              Pertinent Vitals/ Pain       Pain Assessment Pain Assessment: Faces Faces Pain Scale: No hurt         Frequency  Min 2X/week        Progress Toward Goals  OT Goals(current goals can now be found in the care plan section)  Progress towards OT goals: Progressing toward goals  Acute Rehab OT Goals Patient Stated Goal: Pt hopes to go home later today. OT Goal Formulation: With patient Time For Goal Achievement: 07/26/23 Potential to Achieve Goals: Good  Plan         AM-PAC OT "6 Clicks" Daily Activity     Outcome Measure   Help from another person eating meals?: None Help from another person taking care of personal grooming?: A Little Help from another person toileting, which includes using toliet, bedpan, or urinal?: A Little Help from another person bathing (including washing, rinsing, drying)?: A Little Help from another person to put on and taking off regular upper body clothing?: A Little Help from another person to put on and taking off regular lower body clothing?: A Little 6 Click Score: 19    End of Session Equipment Utilized During Treatment: Other (comment) (single point cane)  OT Visit Diagnosis: Unsteadiness on feet (R26.81);Other abnormalities of gait and mobility (R26.89);Muscle weakness (generalized) (M62.81)   Activity Tolerance Patient tolerated treatment well   Patient Left in bed;with call bell/phone within reach   Nurse Communication Mobility status        Time: 1610-9604 OT Time Calculation  (min): 54 min  Charges: OT General Charges $OT Visit: 1 Visit OT Evaluation $OT Eval Moderate Complexity: 1 Mod OT Treatments $Self Care/Home Management : 53-67 mins  Perrin Maltese, OTR/L Acute Rehabilitation Services  Office [phone] 07/15/2023

## 2023-07-15 NOTE — TOC Transition Note (Signed)
 Transition of Care [facility]) - Discharge Note   Patient Details  Name: Benjamin Woods MRN: [id number] Date of Birth: 07-Jan-1941  Transition of Care [facility]) CM/SW Contact:  Leone Haven, RN Phone Number: 07/15/2023, 10:33 AM   Clinical Narrative:     NCM offered choice for HHPT, HHOT,  and home oxygen he states she does not have  a preference,.  NCM made referral to [facility] with [facility] for HHPT, HHOT, he is able to take referral. Soc will begin 24 to 48 hrs post dc.  NCM made referral to [facility] with [employer] for home oxygen.  The tank will be brought up to patient room and concentrator will be set up at patient's home.    Barriers to Discharge: Continued Medical Work up   Patient Goals and CMS Choice Patient states their goals for this hospitalization and ongoing recovery are:: return home   Choice offered to / list presented to : NA      Discharge Placement                       Discharge Plan and Services Additional resources added to the After Visit Summary for   In-house Referral: NA Discharge Planning Services: CM Consult Post Acute Care Choice: NA          DME Arranged: N/A DME Agency: NA       HH Arranged: NA          Social Drivers of Health (SDOH) Interventions SDOH Screenings   Food Insecurity: No Food Insecurity (07/11/2023)  Housing: Low Risk  (07/11/2023)  Transportation Needs: No Transportation Needs (07/11/2023)  Utilities: Not At Risk (07/11/2023)  Alcohol Screen: Low Risk  (08/24/2022)  Depression (PHQ2-9): Low Risk  (03/25/2023)  Financial Resource Strain: Low Risk  (08/24/2022)  Physical Activity: Insufficiently Active (08/24/2022)  Social Connections: Moderately Isolated (07/11/2023)  Tobacco Use: Medium Risk (07/10/2023)  Health Literacy: Adequate Health Literacy (03/25/2023)     Readmission Risk Interventions     No data to display

## 2023-07-15 NOTE — Progress Notes (Signed)
 Physical Therapy Treatment and Discharge Patient Details Name: Benjamin Woods MRN: [id number] DOB: 1940-06-16 Today's Date: 07/15/2023   History of Present Illness Benjamin Woods is a 83 y.o. M admitted 07/10/23 with sepsis. Cardiology consulted for evaluation of acute decompensated heart failure/bradycardia. 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    PT Comments  Patient agreeable to ambulation with O2 saturation monitoring. Patient demonstrated safe use of SPC in RUE during gait. All PT goals met with discharge from acute PT.    If plan is discharge home, recommend the following:     Can travel by private vehicle        Equipment Recommendations  None recommended by PT (Pt already has DME)    Recommendations for Other Services       Precautions / Restrictions Precautions Precautions: Fall Recall of Precautions/Restrictions: Intact Restrictions Weight Bearing Restrictions Per Provider Order: No     Mobility  Bed Mobility Overal bed mobility: Needs Assistance Bed Mobility: Supine to Sit     Supine to sit: Modified independent (Device/Increase time)          Transfers Overall transfer level: Modified independent Equipment used: Straight cane Transfers: Sit to/from Stand Sit to Stand: Modified independent (Device/Increase time)                Ambulation/Gait Ambulation/Gait assistance: Modified independent (Device/Increase time) Gait Distance (Feet): 160 Feet Assistive device: Straight cane Gait Pattern/deviations: Step-through pattern, Decreased stride length, Trunk flexed, Wide base of support   Gait velocity interpretation: 1.31 - 2.62 ft/sec, indicative of limited community ambulator   General Gait Details: Pt ambulated with RUE holding SPC. He took even steps with a WBOS.   Stairs             Wheelchair Mobility     Tilt Bed    Modified Rankin (Stroke Patients Only)       Balance Overall balance assessment: Needs  assistance Sitting-balance support: No upper extremity supported, Feet supported Sitting balance-Leahy Scale: Fair     Standing balance support: Single extremity supported, Bilateral upper extremity supported, During functional activity Standing balance-Leahy Scale: Fair Standing balance comment: Pt needs use of a cane for mobility but can stand statically without UE support during toileting or grooming tasks.                            Communication Communication Communication: No apparent difficulties  Cognition Arousal: Alert Behavior During Therapy: WFL for tasks assessed/performed   PT - Cognitive impairments: No apparent impairments                       PT - Cognition Comments: Pt A,Ox4 Following commands: Intact      Cueing Cueing Techniques: Verbal cues, Tactile cues  Exercises      General Comments        Pertinent Vitals/Pain Pain Assessment Pain Assessment: No/denies pain    Home Living                          Prior Function            PT Goals (current goals can now be found in the care plan section) Acute Rehab PT Goals Patient Stated Goal: Return Home PT Goal Formulation: With patient Time For Goal Achievement: 07/26/23 Potential to Achieve Goals: Good Progress towards PT goals: Goals met/education completed, patient  discharged from PT    Frequency    Min 2X/week      PT Plan      Co-evaluation              AM-PAC PT "6 Clicks" Mobility   Outcome Measure  Help needed turning from your back to your side while in a flat bed without using bedrails?: None Help needed moving from lying on your back to sitting on the side of a flat bed without using bedrails?: None Help needed moving to and from a bed to a chair (including a wheelchair)?: None Help needed standing up from a chair using your arms (e.g., wheelchair or bedside chair)?: None Help needed to walk in hospital room?: None Help needed climbing  3-5 steps with a railing? : A Little 6 Click Score: 23    End of Session Equipment Utilized During Treatment: Oxygen Activity Tolerance: Patient tolerated treatment well Patient left: in chair;with call bell/phone within reach;with chair alarm set   PT Visit Diagnosis: Muscle weakness (generalized) (M62.81);Unsteadiness on feet (R26.81);Other abnormalities of gait and mobility (R26.89)   PT Discharge Note  Patient is being discharged from PT services secondary to:  Goals met and no further therapy needs identified.  Please see latest Therapy Progress Note for current level of functioning and progress toward goals.  Progress and discharge plan and discussed with patient/caregiver and they  Agree    Time: 0857-0909 PT Time Calculation (min) (ACUTE ONLY): 12 min  Charges:    $Gait Training: 8-22 mins PT General Charges $$ ACUTE PT VISIT: 1 Visit                      Jerolyn Center, PT Acute Rehabilitation Services  Office [phone]    Zena Amos 07/15/2023, 9:29 AM

## 2023-07-15 NOTE — Progress Notes (Signed)
 Nurse requested Mobility Specialist to perform oxygen saturation test with pt which includes removing pt from oxygen both at rest and while ambulating.  Below are the results from that testing.     Patient Saturations on Room Air at Rest = spO2 97%  Patient Saturations on Room Air while Ambulating = sp02 85% .   Patient Saturations on 2 Liters of oxygen while Ambulating = sp02 92%  At end of testing pt left in room on 2  Liters of oxygen.  Reported results to nurse.    Addison Lank Mobility Specialist Please contact via SecureChat or  Rehab office at [phone]

## 2023-07-15 NOTE — Progress Notes (Signed)
 SATURATION QUALIFICATIONS: (This note is used to comply with regulatory documentation for home oxygen)  Patient Saturations on Room Air at Rest = 88%  Patient Saturations on Room Air while Ambulating = 86%  Patient Saturations on 2 Liters of oxygen while Ambulating = 92%  Please briefly explain why patient needs home oxygen:  To maintain oxygen saturation >87% during functional activities.    Jerolyn Center, PT Acute Rehabilitation Services  Office [phone]

## 2023-07-15 NOTE — Plan of Care (Signed)
   Problem: Education: Goal: Knowledge of General Education information will improve Description: Including pain rating scale, medication(s)/side effects and non-pharmacologic comfort measures Outcome: Progressing   Problem: Nutrition: Goal: Adequate nutrition will be maintained Outcome: Progressing   Problem: Coping: Goal: Level of anxiety will decrease Outcome: Progressing

## 2023-07-15 NOTE — Plan of Care (Signed)
  Problem: Education: Goal: Knowledge of General Education information will improve Description: Including pain rating scale, medication(s)/side effects and non-pharmacologic comfort measures Outcome: Progressing   Problem: Clinical Measurements: Goal: Will remain free from infection Outcome: Progressing   Problem: Activity: Goal: Risk for activity intolerance will decrease Outcome: Progressing   Problem: Coping: Goal: Level of anxiety will decrease Outcome: Progressing   Problem: Elimination: Goal: Will not experience complications related to urinary retention Outcome: Progressing   Problem: Safety: Goal: Ability to remain free from injury will improve Outcome: Progressing

## 2023-07-15 NOTE — Progress Notes (Signed)
 Mobility Specialist Progress Note:   07/15/23 0930  Mobility  Activity Ambulated with assistance in hallway  Level of Assistance Standby assist, set-up cues, supervision of patient - no hands on  Assistive Device Cane  Distance Ambulated (ft) 100 ft  Activity Response Tolerated well  Mobility Referral Yes  Mobility visit 1 Mobility  Mobility Specialist Start Time (ACUTE ONLY) 0930  Mobility Specialist Stop Time (ACUTE ONLY) 0945  Mobility Specialist Time Calculation (min) (ACUTE ONLY) 15 min   Pt agreeable to mobility session. Required no physical assistance, only supervision during ambulation with cane. SPO2 dropped to 85% on RA while ambulating, requiring 2LO2 to maintain [facility] Pt back in chair with all needs met.   Addison Lank Mobility Specialist Please contact via SecureChat or  Rehab office at [phone]

## 2023-07-16 ENCOUNTER — Other Ambulatory Visit (HOSPITAL_COMMUNITY): Payer: Self-pay

## 2023-07-16 DIAGNOSIS — R001 Bradycardia, unspecified: Secondary | ICD-10-CM | POA: Diagnosis not present

## 2023-07-16 DIAGNOSIS — I509 Heart failure, unspecified: Secondary | ICD-10-CM | POA: Diagnosis not present

## 2023-07-16 DIAGNOSIS — N179 Acute kidney failure, unspecified: Secondary | ICD-10-CM | POA: Diagnosis not present

## 2023-07-16 DIAGNOSIS — I10 Essential (primary) hypertension: Secondary | ICD-10-CM | POA: Diagnosis not present

## 2023-07-16 DIAGNOSIS — I441 Atrioventricular block, second degree: Secondary | ICD-10-CM | POA: Diagnosis not present

## 2023-07-16 DIAGNOSIS — I5033 Acute on chronic diastolic (congestive) heart failure: Secondary | ICD-10-CM | POA: Diagnosis not present

## 2023-07-16 LAB — CULTURE, BLOOD (ROUTINE X 2)
Culture: NO GROWTH
Culture: NO GROWTH
Special Requests: ADEQUATE
Special Requests: ADEQUATE

## 2023-07-16 LAB — BASIC METABOLIC PANEL
Anion gap: 9 (ref 5–15)
BUN: 41 mg/dL — ABNORMAL HIGH (ref 8–23)
CO2: 28 mmol/L (ref 22–32)
Calcium: 8.8 mg/dL — ABNORMAL LOW (ref 8.9–10.3)
Chloride: 101 mmol/L (ref 98–111)
Creatinine, Ser: 1.65 mg/dL — ABNORMAL HIGH (ref 0.61–1.24)
GFR, Estimated: 41 mL/min — ABNORMAL LOW (ref >60)
Glucose, Bld: 84 mg/dL (ref 70–99)
Potassium: 4.1 mmol/L (ref 3.5–5.1)
Sodium: 138 mmol/L (ref 135–145)

## 2023-07-16 LAB — MAGNESIUM: Magnesium: 2.1 mg/dL (ref 1.7–2.4)

## 2023-07-16 MED ORDER — FUROSEMIDE 20 MG PO TABS: 40.0000 mg | ORAL_TABLET | Freq: Every day | ORAL | Status: AC

## 2023-07-16 MED ORDER — CARVEDILOL 3.125 MG PO TABS
3.1250 mg | ORAL_TABLET | Freq: Two times a day (BID) | ORAL | 0 refills | Status: AC
  Filled 2023-07-16: qty 60, 30d supply, fill #0

## 2023-07-16 NOTE — Progress Notes (Signed)
 Occupational Therapy Treatment Patient Details Name: Benjamin Woods MRN: [id number] DOB: 04-Jul-1940 Today's Date: 07/16/2023   History of present illness Benjamin Woods is a 83 y.o. M admitted 07/10/23 with sepsis. Cardiology consulted for evaluation of acute decompensated heart failure/bradycardia. 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   OT comments  Pt progressing toward established OT goals. Provided handout and educated regarding energy conservation strategies and pt able to identify how these strategies may be utilized in his personal context. Challenging balance and endurance with sequential ADL of LB ADL and tub transfer. Pt able to perform with supervision for safety. Will continue to follow. Recommend HHOT at discharge.       If plan is discharge home, recommend the following:  Assistance with cooking/housework   Equipment Recommendations  None recommended by OT    Recommendations for Other Services      Precautions / Restrictions Precautions Precautions: Fall Recall of Precautions/Restrictions: Intact Restrictions Weight Bearing Restrictions Per Provider Order: No       Mobility Bed Mobility               General bed mobility comments: OOB in chair    Transfers Overall transfer level: Modified independent Equipment used: Straight cane Transfers: Sit to/from Stand             General transfer comment: increased time for rise.     Balance Overall balance assessment: Needs assistance Sitting-balance support: No upper extremity supported, Feet supported Sitting balance-Leahy Scale: Fair     Standing balance support: Single extremity supported, Bilateral upper extremity supported, During functional activity Standing balance-Leahy Scale: Fair                             ADL either performed or assessed with clinical judgement   ADL Overall ADL's : Needs assistance/impaired                     Lower Body Dressing:  Supervision/safety;Sit to/from stand Lower Body Dressing Details (indicate cue type and reason): increased time Toilet Transfer: Supervision/safety;Ambulation       Tub/ Shower Transfer: Supervision/safety;Tub transfer;Ambulation;Shower Field seismologist Details (indicate cue type and reason): simulated stepping over the edge of the tub via stepping over trash can Functional mobility during ADLs: Supervision/safety General ADL Comments: initiated and reviewed energy conservation handout and education. Pt able to provide examples of how he may utilize these strategies in the home setting. Pt already uses some energy conservation strategies    Extremity/Trunk Assessment              Vision       Perception     Praxis     Communication Communication Communication: No apparent difficulties   Cognition Arousal: Alert Behavior During Therapy: WFL for tasks assessed/performed Cognition: No apparent impairments                               Following commands: Intact        Cueing   Cueing Techniques: Verbal cues  Exercises      Shoulder Instructions       General Comments on 1L supplementary O2 during session via [place]    Pertinent Vitals/ Pain       Pain Assessment Pain Assessment: Faces Faces Pain Scale: No hurt Pain Intervention(s): Monitored during session  Home Living  Prior Functioning/Environment              Frequency  Min 2X/week        Progress Toward Goals  OT Goals(current goals can now be found in the care plan section)  Progress towards OT goals: Progressing toward goals  Acute Rehab OT Goals Patient Stated Goal: go home OT Goal Formulation: With patient Time For Goal Achievement: 07/26/23 Potential to Achieve Goals: Good ADL Goals Pt Will Perform Grooming: with modified independence;standing Pt Will Perform Lower Body Bathing: with modified  independence;sit to/from stand Pt Will Perform Lower Body Dressing: with modified independence;sit to/from stand Pt Will Transfer to Toilet: with modified independence;ambulating;regular height toilet Pt Will Perform Toileting - Clothing Manipulation and hygiene: with modified independence;sit to/from stand Pt Will Perform Tub/Shower Transfer: Tub transfer;shower seat;ambulating;with modified independence Additional ADL Goal #1: Pt will independently verbalize at least two energy conservation strategies for selfcare tasks.  Plan      Co-evaluation                 AM-PAC OT "6 Clicks" Daily Activity     Outcome Measure   Help from another person eating meals?: None Help from another person taking care of personal grooming?: A Little Help from another person toileting, which includes using toliet, bedpan, or urinal?: A Little Help from another person bathing (including washing, rinsing, drying)?: A Little Help from another person to put on and taking off regular upper body clothing?: A Little Help from another person to put on and taking off regular lower body clothing?: A Little 6 Click Score: 19    End of Session Equipment Utilized During Treatment: Gait belt;Other (comment) (SPC)  OT Visit Diagnosis: Unsteadiness on feet (R26.81);Other abnormalities of gait and mobility (R26.89);Muscle weakness (generalized) (M62.81)   Activity Tolerance Patient tolerated treatment well   Patient Left with call bell/phone within reach;in chair;with chair alarm set   Nurse Communication Mobility status        Time: 1610-9604 OT Time Calculation (min): 17 min  Charges: OT General Charges $OT Visit: 1 Visit OT Treatments $Self Care/Home Management : 8-22 mins  .[email or web address]   Myrla Halsted 07/16/2023, 12:21 PM

## 2023-07-16 NOTE — Progress Notes (Signed)
 SATURATION QUALIFICATIONS: (This note is used to comply with regulatory documentation for home oxygen)  Patient Saturations on Room Air at Rest = 91%  Patient Saturations on Room Air while Ambulating = 86%  Patient Saturations on 2 Liters of oxygen while Ambulating = 90%  Please briefly explain why patient needs home oxygen: to maintain safe SpO2 levels while ambulating

## 2023-07-16 NOTE — Progress Notes (Signed)
 Patient Name: Benjamin Woods Date of Encounter: 07/16/2023 [facility] Cardiologist: Gypsy Balsam, MD   Interval Summary  .    Patient feeling well this AM. Breathing has improved. Able to lay flat without shortness of breath. Nasal cannula set to 1L, but when I went to see him his [place] was not correctly inserted into the nostrils. No dizziness, syncope, near syncope, lightheadedness. No lower extremity swelling   Vital Signs .    Vitals:   07/15/23 2359 07/16/23 0356 07/16/23 0430 07/16/23 0741  BP: 111/63 (!) 116/57  121/67  Pulse: 67 69  73  Resp: 20 19  20   Temp: 97.7 F (36.5 C) 98.1 F (36.7 C)  (!) 97.5 F (36.4 C)  TempSrc: Oral Oral  Oral  SpO2: 93% 91%  93%  Weight:   103.7 kg   Height:        Intake/Output Summary (Last 24 hours) at 07/16/2023 0754 Last data filed at 07/16/2023 0734 Gross per 24 hour  Intake 930 ml  Output 2550 ml  Net -1620 ml      07/16/2023    4:30 AM 07/15/2023    5:05 AM 07/14/2023    3:41 AM  Last 3 Weights  Weight (lbs) 228 lb 9.9 oz 229 lb 0.9 oz 230 lb 3.2 oz  Weight (kg) 103.7 kg 103.9 kg 104.418 kg      Telemetry/ECG    NSR, HR in the 60s. Occasional non conducted PACs  - Personally Reviewed  Physical Exam .   GEN: No acute distress.  Laying flat in the bed  Neck: No JVD Cardiac:  RRR, no murmurs, rubs, or gallops. Radial pulses 2+ bilaterally  Respiratory: Clear to auscultation bilaterally. Normal WOB  GI: Soft, nontender, non-distended  MS: No edema in BLE   Assessment & Plan .     Acute on chronic HFmrEF  - Echocardiogram this admission showed EF 45-50%, grade I DD, normal RV  - On presentation, patient complained of shortness of breath, weight gain. BNP elevated to 426. CXR with mild interstitial edema  - Patient was on IV lasix earlier this admission and was transitioned to PO on 3/9. However, we were unable to wean him off supplemental oxygen and he received IV lasix 80 mg yesterday and again this  AM - Put out 2.225 L urine yesterday. Currently net -7.6 L since admission. CXR yesterday showed improvement in interstitial edema  - Creatinine stable at 1.65 this AM (down from 1.82 yesterday). He is euvolemic on exam  - Attempt to wean home O2  - Continue PO lasix 40 mg daily  - Continue carvedilol 3.125 mg BID - Would likely benefit from SGLT2i. He did have AKI this admission with creatinine increasing from 1.42 on arrival to 1.93 on 3/8. As above, renal function is stable/improving with creatinine 1.65 today. Recommend rechecking BMP as an outpatient and further titration of GDMT as renal function and BP tolerate   Bradycardia  - Patient was bradycardic in the setting of hypothermia. HR improved with warming the patient  - Blocked PACs noted on telemetry, not wenckebach  - Patient previously on carvedilol 25 mg BID at home. This was discontinued on arrival due to low HR. Now, back on carvedilol 3.125 mg BID - Per telemetry, HR remains in the 60s   Mild aortic dilatation  Mild MR  - Echocardiogram in 07/2022 showed moderate dilatation of the ascending aorta and of the aortic root measuring 41 mm  - Echo  this admission with mild dilatation of the aortic root measuring 38 mm, mild dilatation of the ascending aorta measuring 39 mm. Also noted mild MR  - BP well controlled  - Continue to monitor as an outpatient   History of DVT  - Continue eliquis 5 mg BID   Otherwise per primary  - AKI on CKD stage 3a - GERD  - Pancytopenia  - BPH   Patient has cardiology follow up arranged for 3/21   For questions or updates, please contact [facility] Please consult [email or web address] for contact info under        Signed, Jonita Albee, PA-C

## 2023-07-16 NOTE — Progress Notes (Signed)
 Reviewed AVS, patient expressed understanding of medications, MD follow up reviewed.   Removed IV, Site clean, dry and intact.  Patient states all belongings brought to the hospital at time of admission are accounted for and packed to take home.  Picked up medications from [facility] pharmacy. Pt refused to be transported to Discharge lounge to wait for transportation home.

## 2023-07-16 NOTE — Discharge Summary (Addendum)
 Physician Discharge Summary   Patient: Benjamin Woods MRN: [id number] DOB: May 15, 1940  Admit date:     07/10/2023  Discharge date: 07/16/23  Discharge Physician: [name] Cregg   PCP: [name], MD   Recommendations at discharge:    Furosemide has been increased to 40 mg daily.  Added supplemental 02 per [place] to keep 02 saturation 92% or greater. Limited guideline directed medical therapy due to acutely reduced GFR.  Resume entresto as outpatient when renal function more stable.  Follow up renal function and electrolytes in 7 to 10 days.  Follow up with Dr [name] in 7 to 10 days.  Follow up with Cardiology as scheduled.  Home health services.   Discharge Diagnoses: Principal Problem:   Acute on chronic diastolic CHF (congestive heart failure) (HCC) Active Problems:   HTN (hypertension)   Mobitz type 1 second degree AV block   Acute kidney injury superimposed on stage 3a chronic kidney disease (HCC)   DVT (deep venous thrombosis) (HCC)   GERD (gastroesophageal reflux disease)   Pancytopenia (HCC)   BPH (benign prostatic hyperplasia)  Resolved Problems:   * No resolved hospital problems. [facility] Course: Benjamin Woods was admitted to the hospital with the working diagnosis of heart failure exacerbation.   83 yo male with the past medical history of heart failure, hypertension, history of DVT and PE, GERD, dyslipidemia, and deforming gout arthritis who presented with dyspnea and lower extremity edema. He was diagnosed with volume overload at [facility] and was transferred to [facility] for further evaluation.  On his initial physical examination his blood pressure was 125/68, HR 59, RR 16 and 02 saturation 94%, lungs with no wheezing or rhonchi, heart with S1 and S2 present and regular, abdomen with no distention, positive lower extremity edema.   Na 134, K 4.8 Cl 104 bicarbonate 26, glucose 107 bun 27, cr 1,42 BNP 426  High sensitive troponin 10 and 10  Lactic acid  1,0  Wbc 4.3 hgb 12.0 plt 109   Sars covid 19 negative Influenza negative   Urine analysis SG 1,010, positive nitrates, small leukocytes, negative hgb, 0-5 wbc.   Chest radiograph with hypoinflation with bilateral hilar vascular congestion, with cephalization of the vasculature and fluid in the right fissure, bibasilar atelectasis.   EKG 57 bpm, normal axis, normal intervals, qtc 441 sinus rhythm with 1st degree AV block, no significant ST segment changes, negative T wave lead aVL.   03/08 responding well to diuresis.  03/09 transition to oral loop diuretics with plan for possible discharge home tomorrow.  03/10 continue needing supplemental 02 per [name], plan to further diurese today in attempt to wean off supplemental 02, possible discharge home tomorrow.   Assessment and Plan: * Acute on chronic diastolic CHF (congestive heart failure) (HCC) Echocardiogram with mild reduction in LV systolic function EF 45 to 50%, mild dilatated LV cavity, RV systolic function preserved. LA with moderate dilatation, no significant valvular disease.  LV inferior basal septal hypokinesis,   Patient was placed on IV furosemide for diuresis, negative fluid balance was achieved, - 7,817 ml, with improvement in his symptoms.   At the time of discharge patient will continue furosemide 40 mg po daily.  Continue low dose carvedilol, resume RAAS inhibition as outpatient when renal function more stable.   Acute hypoxemic respiratory failure, patient continue to have 02 desaturation after diuresis, down to 86%, that improved with supplemental 02 per [place].  Patient will continue supplemental 02 at home.  Will  need outpatient pulmonary function testing.   HTN (hypertension) Continue carvedilol, follow up renal function as outpatient in order to resume RAAS inhibition.   Mobitz type 1 second degree AV block Telemetry with blocked PAC, not signs of AV block, plan to resume low dose carvedilol at 3,125 mg bid.  Follow  up as outpatient.   Acute kidney injury superimposed on stage 3a chronic kidney disease (HCC) At the time of discharge his renal function is improving with serum cr at 1.65 with K at 4,1 and serum bicarbonate at 28  Na 138 and Mg 2.1   DVT (deep venous thrombosis) (HCC) History of pulmonary embolism.  Continue anticoagulation   GERD (gastroesophageal reflux disease) Continue antiacid therapy  Pancytopenia (HCC) Wbc has recovered to 5,7 continue to have anemia at 12.1 and thrombocytopenia 104  Follow up cell count as outpatient.   BPH (benign prostatic hyperplasia) No signs of urinary retention Urinary tract infection has been ruled out,. Sepsis ruled out.      Consultants: cardiology  Procedures performed: none   Disposition: Home Diet recommendation:  Cardiac diet DISCHARGE MEDICATION: Allergies as of 07/16/2023   No Known Allergies      Medication List     STOP taking these medications    amLODipine 10 MG tablet Commonly known as: NORVASC   Entresto 24-26 MG Generic drug: sacubitril-valsartan   potassium chloride 10 MEQ tablet Commonly known as: KLOR-CON       TAKE these medications    allopurinol 300 MG tablet Commonly known as: ZYLOPRIM Take 300 mg by mouth daily.   atorvastatin 20 MG tablet Commonly known as: LIPITOR Take 1 tablet (20 mg total) by mouth daily.   carvedilol 3.125 MG tablet Commonly known as: COREG Take 1 tablet (3.125 mg total) by mouth 2 (two) times daily with a meal. What changed:  medication strength how much to take   cetirizine 10 MG tablet Commonly known as: ZYRTEC Take 10 mg by mouth daily.   colchicine 0.6 MG tablet Take 0.6 mg by mouth daily.   Dexilant 60 MG capsule Generic drug: dexlansoprazole Take 1 capsule by mouth daily.   Eliquis 5 MG Tabs tablet Generic drug: apixaban Take 5 mg by mouth 2 (two) times daily.   Febuxostat 80 MG Tabs Take 80 mg by mouth daily.   furosemide 20 MG tablet Commonly  known as: LASIX Take 2 tablets (40 mg total) by mouth daily. What changed: how much to take   guaiFENesin 600 MG 12 hr tablet Commonly known as: MUCINEX Take by mouth 2 (two) times daily.   latanoprost 0.005 % ophthalmic solution Commonly known as: XALATAN Place 1 drop into both eyes at bedtime.   sildenafil 100 MG tablet Commonly known as: VIAGRA Take 100 mg by mouth daily as needed for erectile dysfunction. One hour prior to sex   tamsulosin 0.4 MG Caps capsule Commonly known as: FLOMAX Take 0.4 mg by mouth 2 (two) times daily.               Durable Medical Equipment  (From admission, onward)           Start     Ordered   07/15/23 1046  For home use only DME oxygen  Once       Question Answer Comment  Length of Need 6 Months   Mode or (Route) Nasal cannula   Liters per Minute 2   Frequency Continuous (stationary and portable oxygen unit needed)   Oxygen conserving device  Yes   Oxygen delivery system Gas      07/15/23 1045            Follow-up Information     [facility] Follow up.   Specialty: Home Health Services Why: Agency will call you to set up apt times Contact information: [address] [place] [address] [phone]         [facility] Follow up.   Why: home oxygen Contact information: [phone]        [name], MD. Go on 07/23/2023.   Specialty: Internal Medicine Why: @3 :00pm Contact information: [address] [place] [address] [phone]                Discharge Exam: Filed Weights   07/14/23 0341 07/15/23 0505 07/16/23 0430  Weight: 104.4 kg 103.9 kg 103.7 kg   BP 113/64 (BP Location: Right Arm)   Pulse 73   Temp (!) 97.1 F (36.2 C) (Oral)   Resp 18   Ht 6' (1.829 m)   Wt 103.7 kg   SpO2 93%   BMI 31.01 kg/m   Patient with no chest pain or dyspnea, no PND or orthopnea, no lower extremity edema, he is out of bed to the chair with good  toleration. Has been ambulation with help of a cane  Neurology awake and alert ENT with mild pallor Cardiovascular with S1 and S2 present and regular with no gallops, rubs or murmurs No JVD Trace lower extremity edema, ted hose in place Respiratory with no rales or wheezing, no rhonchi  Abdomen with no distention   Condition at discharge: stable  The results of significant diagnostics from this hospitalization (including imaging, microbiology, ancillary and laboratory) are listed below for reference.   Imaging Studies: DG Chest 1 View Result Date: 07/15/2023 CLINICAL DATA:  Follow-up exam.  History of shortness of breath. EXAM: CHEST  1 VIEW COMPARISON:  07/10/2023 FINDINGS: Again noted are low lung volumes with chronic elevation of the right hemidiaphragm. Linear densities in left lower lung are suggestive for atelectasis. Negative for a pneumothorax. Heart size is normal. Trachea is midline. Atherosclerotic calcifications at the aortic arch. IMPRESSION: 1. Low lung volumes with chronic elevation of the right hemidiaphragm. 2. Linear densities in the left lower lung are suggestive for atelectasis. Electronically Signed   By: Richarda Overlie M.D.   On: 07/15/2023 18:35   VAS [place] ABI WITH/WO TBI Result Date: 07/14/2023  LOWER EXTREMITY DOPPLER STUDY Patient Name:  RISHAWN WALCK  Date of Exam:   07/13/2023 Medical Rec #: [id number]           Accession #:    [id number] Date of Birth: [birth year]           Patient Gender: M Patient Age:   25 years Exam Location:  [facility] Procedure:      VAS [place] ABI WITH/WO TBI Referring Phys: EKTA PATEL --------------------------------------------------------------------------------  Indications: Unable to palpate pulses. High Risk         Hypertension, hyperlipidemia, past history of smoking, prior Factors:          CVA. Other Factors: Patient admitted with CHF exacerbation. Significant leg and foot                swelling, bilaterally.  Limitations: Today's exam  was limited due to edema, arrythmia. Comparison Study: No prior study on file Performing Technologist: Sherren Kerns RVS  Examination Guidelines: A complete evaluation  includes at minimum, Doppler waveform signals and systolic blood pressure reading at the level of bilateral brachial, anterior tibial, and posterior tibial arteries, when vessel segments are accessible. Bilateral testing is considered an integral part of a complete examination. Photoelectric Plethysmograph (PPG) waveforms and toe systolic pressure readings are included as required and additional duplex testing as needed. Limited examinations for reoccurring indications may be performed as noted.  ABI Findings: +---------+------------------+-----+----------+---------+ Right    Rt Pressure (mmHg)IndexWaveform  Comment   +---------+------------------+-----+----------+---------+ Brachial 120                    triphasic           +---------+------------------+-----+----------+---------+ PTA      113               0.90 monophasicby duplex +---------+------------------+-----+----------+---------+ DP       115               0.92 biphasic            +---------+------------------+-----+----------+---------+ Great Toe55                0.44 Abnormal            +---------+------------------+-----+----------+---------+ +---------+------------------+-----+----------+---------+ Left     Lt Pressure (mmHg)IndexWaveform  Comment   +---------+------------------+-----+----------+---------+ Brachial 125                    triphasic           +---------+------------------+-----+----------+---------+ PTA      101               0.81 monophasicby duplex +---------+------------------+-----+----------+---------+ DP       110               0.88 monophasicby duplex +---------+------------------+-----+----------+---------+ Great Toe67                0.54 Abnormal             +---------+------------------+-----+----------+---------+ +-------+-----------+-----------+------------+------------+ ABI/TBIToday's ABIToday's TBIPrevious ABIPrevious TBI +-------+-----------+-----------+------------+------------+ Right  0.92       0.44                                +-------+-----------+-----------+------------+------------+ Left   0.88       0.54                                +-------+-----------+-----------+------------+------------+  Summary: ABIs likely falsely elevated based off of waveforms Right: Resting right ankle-brachial index indicates mild right lower extremity arterial disease. The right toe-brachial index is abnormal. Left: Resting left ankle-brachial index indicates mild left lower extremity arterial disease. The left toe-brachial index is abnormal. *See table(s) above for measurements and observations.  Electronically signed by Gerarda Fraction on 07/14/2023 at 9:51:24 AM.    Final    ECHOCARDIOGRAM COMPLETE Result Date: 07/12/2023    ECHOCARDIOGRAM REPORT   Patient Name:   CHAEL URENDA Date of Exam: 07/12/2023 Medical Rec #:  [id number]          Height:       72.0 in Accession #:    [id number]         Weight:       233.5 lb Date of Birth:  Oct 05, 1940          BSA:          2.275 m Patient Age:  82 years           BP:           109/62 mmHg Patient Gender: M                  HR:           68 bpm. Exam Location:  Inpatient Procedure: 2D Echo, Color Doppler and Cardiac Doppler (Both Spectral and Color            Flow Doppler were utilized during procedure). Indications:    I50.21 Acute systolic (congestive) heart failure  History:        Patient has prior history of Echocardiogram examinations, most                 recent 07/18/2022. CHF; Risk Factors:Hypertension.  Sonographer:    Irving Burton Senior RDCS Referring Phys: Irena Cords, V IMPRESSIONS  1. Inferior basal hypokinesis Septal hypokinesis. Left ventricular ejection fraction, by estimation, is 45 to 50%. The left  ventricle has mildly decreased function. The left ventricle demonstrates regional wall motion abnormalities (see scoring diagram/findings for description). The left ventricular internal cavity size was mildly dilated. Left ventricular diastolic parameters are consistent with Grade I diastolic dysfunction (impaired relaxation).  2. Right ventricular systolic function is normal. The right ventricular size is normal.  3. Left atrial size was moderately dilated.  4. The mitral valve is abnormal. Mild mitral valve regurgitation. No evidence of mitral stenosis.  5. The aortic valve is tricuspid. There is mild calcification of the aortic valve. There is mild thickening of the aortic valve. Aortic valve regurgitation is not visualized. Aortic valve sclerosis is present, with no evidence of aortic valve stenosis.  6. Aortic dilatation noted. There is mild dilatation of the aortic root, measuring 38 mm. There is mild dilatation of the ascending aorta, measuring 39 mm.  7. The inferior vena cava is normal in size with greater than 50% respiratory variability, suggesting right atrial pressure of 3 mmHg. FINDINGS  Left Ventricle: Inferior basal hypokinesis Septal hypokinesis. Left ventricular ejection fraction, by estimation, is 45 to 50%. The left ventricle has mildly decreased function. The left ventricle demonstrates regional wall motion abnormalities. Strain was performed and the global longitudinal strain is indeterminate. The left ventricular internal cavity size was mildly dilated. There is no left ventricular hypertrophy. Left ventricular diastolic parameters are consistent with Grade I diastolic dysfunction (impaired relaxation). Right Ventricle: The right ventricular size is normal. No increase in right ventricular wall thickness. Right ventricular systolic function is normal. Left Atrium: Left atrial size was moderately dilated. Right Atrium: Right atrial size was normal in size. Pericardium: There is no evidence of  pericardial effusion. Mitral Valve: The mitral valve is abnormal. There is mild thickening of the mitral valve leaflet(s). There is mild calcification of the mitral valve leaflet(s). Mild mitral valve regurgitation. No evidence of mitral valve stenosis. Tricuspid Valve: The tricuspid valve is normal in structure. Tricuspid valve regurgitation is mild . No evidence of tricuspid stenosis. Aortic Valve: The aortic valve is tricuspid. There is mild calcification of the aortic valve. There is mild thickening of the aortic valve. Aortic valve regurgitation is not visualized. Aortic valve sclerosis is present, with no evidence of aortic valve stenosis. Pulmonic Valve: The pulmonic valve was normal in structure. Pulmonic valve regurgitation is not visualized. No evidence of pulmonic stenosis. Aorta: Aortic dilatation noted. There is mild dilatation of the aortic root, measuring 38 mm. There is mild dilatation of the ascending aorta,  measuring 39 mm. Venous: The inferior vena cava is normal in size with greater than 50% respiratory variability, suggesting right atrial pressure of 3 mmHg. IAS/Shunts: No atrial level shunt detected by color flow Doppler. Additional Comments: 3D was performed not requiring image post processing on an independent workstation and was indeterminate.  LEFT VENTRICLE PLAX 2D LVIDd:         4.90 cm      Diastology LVIDs:         3.70 cm      LV e' medial:    5.77 cm/s LV PW:         0.80 cm      LV E/e' medial:  8.5 LV IVS:        0.80 cm      LV e' lateral:   4.13 cm/s LVOT diam:     2.20 cm      LV E/e' lateral: 11.9 LV SV:         89 LV SV Index:   39 LVOT Area:     3.80 cm  LV Volumes (MOD) LV vol d, MOD A2C: 132.0 ml LV vol d, MOD A4C: 146.0 ml LV vol s, MOD A2C: 68.3 ml LV vol s, MOD A4C: 61.1 ml LV SV MOD A2C:     63.7 ml LV SV MOD A4C:     146.0 ml LV SV MOD BP:      78.4 ml RIGHT VENTRICLE RV S prime:     9.90 cm/s TAPSE (M-mode): 2.1 cm LEFT ATRIUM             Index        RIGHT ATRIUM            Index LA diam:        4.50 cm 1.98 cm/m   RA Area:     16.40 cm LA Vol (A2C):   81.1 ml 35.64 ml/m  RA Volume:   33.40 ml  14.68 ml/m LA Vol (A4C):   71.4 ml 31.38 ml/m LA Biplane Vol: 76.5 ml 33.62 ml/m  AORTIC VALVE LVOT Vmax:   102.00 cm/s LVOT Vmean:  77.100 cm/s LVOT VTI:    0.233 m  AORTA Ao Root diam: 3.80 cm Ao Asc diam:  3.90 cm MITRAL VALVE MV Area (PHT): 2.35 cm    SHUNTS MV Decel Time: 323 msec    Systemic VTI:  0.23 m MV E velocity: 49.30 cm/s  Systemic Diam: 2.20 cm MV A velocity: 79.10 cm/s MV E/A ratio:  0.62 Charlton Haws MD Electronically signed by Charlton Haws MD Signature Date/Time: 07/12/2023/2:14:20 PM    Final    DG Chest 2 View Result Date: 07/10/2023 CLINICAL DATA:  Shortness of breath and bilateral foot swelling x2 days. EXAM: CHEST - 2 VIEW COMPARISON:  August 14, 2019 FINDINGS: The heart size and mediastinal contours are within normal limits. There is moderate to marked severity calcification of the aortic arch. Low lung volumes are seen with mild, stable elevation of the right hemidiaphragm. Mild, diffusely increased interstitial lung markings are present. Mild right infrahilar scarring and/or atelectasis is also noted. No pleural effusion or pneumothorax is identified. Multilevel degenerative changes are seen throughout the thoracic spine. IMPRESSION: 1. Low lung volumes with mild, diffusely increased interstitial lung markings which may represent mild interstitial edema. 2. Mild right infrahilar scarring and/or atelectasis. Electronically Signed   By: Aram Candela M.D.   On: 07/10/2023 20:16    Microbiology: Results for orders placed or  performed during the hospital encounter of 07/10/23  Resp panel by RT-PCR (RSV, Flu A&B, Covid) Anterior Nasal Swab     Status: None   Collection Time: 07/11/23  8:56 AM   Specimen: Anterior Nasal Swab  Result Value Ref Range Status   SARS Coronavirus 2 by RT PCR NEGATIVE NEGATIVE Final    Comment: (NOTE) SARS-CoV-2 target  nucleic acids are NOT DETECTED.  The SARS-CoV-2 RNA is generally detectable in upper respiratory specimens during the acute phase of infection. The lowest concentration of SARS-CoV-2 viral copies this assay can detect is 138 copies/mL. A negative result does not preclude SARS-Cov-2 infection and should not be used as the sole basis for treatment or other patient management decisions. A negative result may occur with  improper specimen collection/handling, submission of specimen other than nasopharyngeal swab, presence of viral mutation(s) within the areas targeted by this assay, and inadequate number of viral copies(<138 copies/mL). A negative result must be combined with clinical observations, patient history, and epidemiological information. The expected result is Negative.  Fact Sheet for Patients:  BloggerCourse.com  Fact Sheet for Healthcare Providers:  SeriousBroker.it  This test is no t yet approved or cleared by the [place] FDA and  has been authorized for detection and/or diagnosis of SARS-CoV-2 by FDA under an Emergency Use Authorization (EUA). This EUA will remain  in effect (meaning this test can be used) for the duration of the COVID-19 declaration under Section 564(b)(1) of the Act, 21 U.S.C.section 360bbb-3(b)(1), unless the authorization is terminated  or revoked sooner.       Influenza A by PCR NEGATIVE NEGATIVE Final   Influenza B by PCR NEGATIVE NEGATIVE Final    Comment: (NOTE) The Xpert Xpress SARS-CoV-2/FLU/RSV plus assay is intended as an aid in the diagnosis of influenza from Nasopharyngeal swab specimens and should not be used as a sole basis for treatment. Nasal washings and aspirates are unacceptable for Xpert Xpress SARS-CoV-2/FLU/RSV testing.  Fact Sheet for Patients: BloggerCourse.com  Fact Sheet for Healthcare  Providers: SeriousBroker.it  This test is not yet approved or cleared by the [place] FDA and has been authorized for detection and/or diagnosis of SARS-CoV-2 by FDA under an Emergency Use Authorization (EUA). This EUA will remain in effect (meaning this test can be used) for the duration of the COVID-19 declaration under Section 564(b)(1) of the Act, 21 U.S.C. section 360bbb-3(b)(1), unless the authorization is terminated or revoked.     Resp Syncytial Virus by PCR NEGATIVE NEGATIVE Final    Comment: (NOTE) Fact Sheet for Patients: BloggerCourse.com  Fact Sheet for Healthcare Providers: SeriousBroker.it  This test is not yet approved or cleared by the [place] FDA and has been authorized for detection and/or diagnosis of SARS-CoV-2 by FDA under an Emergency Use Authorization (EUA). This EUA will remain in effect (meaning this test can be used) for the duration of the COVID-19 declaration under Section 564(b)(1) of the Act, 21 U.S.C. section 360bbb-3(b)(1), unless the authorization is terminated or revoked.  Performed at [facility], [address] [place], [place] [address]   Blood Culture (routine x 2)     Status: None   Collection Time: 07/11/23 10:30 AM   Specimen: BLOOD  Result Value Ref Range Status   Specimen Description   Final    BLOOD RIGHT ANTECUBITAL Performed at [facility], [address] [place], [place] [address]    Special Requests   Final    BOTTLES DRAWN AEROBIC AND  ANAEROBIC Blood Culture adequate volume Performed at [facility], [address] [place], [place] [address]    Culture   Final    NO GROWTH 5 DAYS Performed at [facility], [address], [place], [place] [address]    Report Status 07/16/2023 FINAL  Final  Blood Culture (routine x 2)     Status: None   Collection Time: 07/11/23 10:30 AM   Specimen: BLOOD   Result Value Ref Range Status   Specimen Description   Final    BLOOD BLOOD LEFT HAND Performed at [facility], [address] [place], [place] [address]    Special Requests   Final    BOTTLES DRAWN AEROBIC AND ANAEROBIC Blood Culture adequate volume Performed at [facility], [address] [place], [place] [address]    Culture   Final    NO GROWTH 5 DAYS Performed at [facility], [address], [place], [place] [address]    Report Status 07/16/2023 FINAL  Final  MRSA Next Gen by PCR, Nasal     Status: None   Collection Time: 07/12/23 11:30 AM   Specimen: Nasal Mucosa; Nasal Swab  Result Value Ref Range Status   MRSA by PCR Next Gen NOT DETECTED NOT DETECTED Final    Comment: (NOTE) The GeneXpert MRSA Assay (FDA approved for NASAL specimens only), is one component of a comprehensive MRSA colonization surveillance program. It is not intended to diagnose MRSA infection nor to guide or monitor treatment for MRSA infections. Test performance is not FDA approved in patients less than 29 years old. Performed at [facility], [address] [place], [place] [address]     Labs: CBC: Recent Labs  Lab 07/10/23 1652 07/11/23 1018 07/12/23 0245 07/13/23 0243  WBC 4.3 3.3* 5.0 5.7  NEUTROABS 2.0 1.5* 2.5  --   HGB 12.0* 11.9* 12.0* 12.1*  HCT 39.2 38.0* 37.8* 38.0*  MCV 86.7 86.2 84.9 85.2  PLT 109* 99* 104* 104*   Basic Metabolic Panel: Recent Labs  Lab 07/11/23 2127 07/12/23 0245 07/13/23 0243 07/14/23 0338 07/15/23 0223 07/16/23 0308  NA  --  138 137 138 140 138  K  --  4.0 3.7 4.3 4.5 4.1  CL  --  105 102 104 103 101  CO2  --  28 29 29 30 28   GLUCOSE  --  94 103* 117* 94 84  BUN  --  25* 29* 32* 35* 41*  CREATININE  --  1.71* 1.93* 1.74* 1.82* 1.65*  CALCIUM  --  8.4* 8.6* 8.5* 8.7* 8.8*  MG 1.8 1.9 2.0  --   --  2.1   Liver Function Tests: Recent Labs  Lab 07/10/23 1652 07/11/23 1018  AST 29 29  ALT 27 26   ALKPHOS 143* 143*  BILITOT 0.5 0.5  PROT 8.2* 7.6  ALBUMIN 3.3* 3.1*   CBG: No results for input(s): "GLUCAP" in the last 168 hours.  Discharge time spent: greater than 30 minutes.  Signed: Coralie Keens, MD [facility] 07/16/2023

## 2023-07-16 NOTE — Care Management Important Message (Signed)
 Important Message  Patient Details  Name: Benjamin Woods MRN: [id number] Date of Birth: [birth year]   Important Message Given:  Yes - Medicare IM     Renie Ora 07/16/2023, 9:34 AM

## 2023-07-16 NOTE — Progress Notes (Signed)
 Mobility Specialist Progress Note:   07/16/23 1120  Mobility  Activity Ambulated with assistance in hallway  Level of Assistance Standby assist, set-up cues, supervision of patient - no hands on  Assistive Device Cane  Distance Ambulated (ft) 150 ft  Activity Response Tolerated well  Mobility Referral Yes  Mobility visit 1 Mobility  Mobility Specialist Start Time (ACUTE ONLY) 1120  Mobility Specialist Stop Time (ACUTE ONLY) 1137  Mobility Specialist Time Calculation (min) (ACUTE ONLY) 17 min   Pt agreeable to mobility session. No physical assistance required, only supervision with use of cane. Pt continues to required 2LO2 to maintain SpO2 WFL. Pt back in chair with all needs met, alarm on.   Addison Lank Mobility Specialist Please contact via SecureChat or  Rehab office at [phone]

## 2023-07-18 DIAGNOSIS — E785 Hyperlipidemia, unspecified: Secondary | ICD-10-CM | POA: Diagnosis not present

## 2023-07-18 DIAGNOSIS — N1831 Chronic kidney disease, stage 3a: Secondary | ICD-10-CM | POA: Diagnosis not present

## 2023-07-18 DIAGNOSIS — E440 Moderate protein-calorie malnutrition: Secondary | ICD-10-CM | POA: Diagnosis not present

## 2023-07-18 DIAGNOSIS — Z7901 Long term (current) use of anticoagulants: Secondary | ICD-10-CM | POA: Diagnosis not present

## 2023-07-18 DIAGNOSIS — I5021 Acute systolic (congestive) heart failure: Secondary | ICD-10-CM | POA: Diagnosis not present

## 2023-07-18 DIAGNOSIS — I82409 Acute embolism and thrombosis of unspecified deep veins of unspecified lower extremity: Secondary | ICD-10-CM | POA: Diagnosis not present

## 2023-07-18 DIAGNOSIS — I5033 Acute on chronic diastolic (congestive) heart failure: Secondary | ICD-10-CM | POA: Diagnosis not present

## 2023-07-18 DIAGNOSIS — I441 Atrioventricular block, second degree: Secondary | ICD-10-CM | POA: Diagnosis not present

## 2023-07-18 DIAGNOSIS — J9601 Acute respiratory failure with hypoxia: Secondary | ICD-10-CM | POA: Diagnosis not present

## 2023-07-18 DIAGNOSIS — K219 Gastro-esophageal reflux disease without esophagitis: Secondary | ICD-10-CM | POA: Diagnosis not present

## 2023-07-18 DIAGNOSIS — Z86711 Personal history of pulmonary embolism: Secondary | ICD-10-CM | POA: Diagnosis not present

## 2023-07-18 DIAGNOSIS — A419 Sepsis, unspecified organism: Secondary | ICD-10-CM | POA: Diagnosis not present

## 2023-07-18 DIAGNOSIS — Z8673 Personal history of transient ischemic attack (TIA), and cerebral infarction without residual deficits: Secondary | ICD-10-CM | POA: Diagnosis not present

## 2023-07-18 DIAGNOSIS — Z9981 Dependence on supplemental oxygen: Secondary | ICD-10-CM | POA: Diagnosis not present

## 2023-07-18 DIAGNOSIS — I130 Hypertensive heart and chronic kidney disease with heart failure and stage 1 through stage 4 chronic kidney disease, or unspecified chronic kidney disease: Secondary | ICD-10-CM | POA: Diagnosis not present

## 2023-07-18 DIAGNOSIS — N179 Acute kidney failure, unspecified: Secondary | ICD-10-CM | POA: Diagnosis not present

## 2023-07-18 DIAGNOSIS — N3090 Cystitis, unspecified without hematuria: Secondary | ICD-10-CM | POA: Diagnosis not present

## 2023-07-18 DIAGNOSIS — M109 Gout, unspecified: Secondary | ICD-10-CM | POA: Diagnosis not present

## 2023-07-18 DIAGNOSIS — E1122 Type 2 diabetes mellitus with diabetic chronic kidney disease: Secondary | ICD-10-CM | POA: Diagnosis not present

## 2023-07-22 ENCOUNTER — Telehealth: Payer: Self-pay | Admitting: Cardiology

## 2023-07-22 NOTE — Telephone Encounter (Signed)
 Pt's daughter Benjamin Woods calling to advise that pt's weight was 228 on 3/14 and today it was 231.2. She is wanting to know if he needs to be seen sooner than pending appt on Friday or requesting cb

## 2023-07-23 DIAGNOSIS — N184 Chronic kidney disease, stage 4 (severe): Secondary | ICD-10-CM | POA: Diagnosis not present

## 2023-07-23 DIAGNOSIS — Z79899 Other long term (current) drug therapy: Secondary | ICD-10-CM | POA: Diagnosis not present

## 2023-07-23 DIAGNOSIS — I5030 Unspecified diastolic (congestive) heart failure: Secondary | ICD-10-CM | POA: Diagnosis not present

## 2023-07-23 DIAGNOSIS — Z09 Encounter for follow-up examination after completed treatment for conditions other than malignant neoplasm: Secondary | ICD-10-CM | POA: Diagnosis not present

## 2023-07-23 DIAGNOSIS — D61818 Other pancytopenia: Secondary | ICD-10-CM | POA: Diagnosis not present

## 2023-07-23 DIAGNOSIS — I441 Atrioventricular block, second degree: Secondary | ICD-10-CM | POA: Diagnosis not present

## 2023-07-23 DIAGNOSIS — N179 Acute kidney failure, unspecified: Secondary | ICD-10-CM | POA: Diagnosis not present

## 2023-07-24 DIAGNOSIS — E1122 Type 2 diabetes mellitus with diabetic chronic kidney disease: Secondary | ICD-10-CM | POA: Diagnosis not present

## 2023-07-24 DIAGNOSIS — K219 Gastro-esophageal reflux disease without esophagitis: Secondary | ICD-10-CM | POA: Diagnosis not present

## 2023-07-24 DIAGNOSIS — N3090 Cystitis, unspecified without hematuria: Secondary | ICD-10-CM | POA: Diagnosis not present

## 2023-07-24 DIAGNOSIS — Z7901 Long term (current) use of anticoagulants: Secondary | ICD-10-CM | POA: Diagnosis not present

## 2023-07-24 DIAGNOSIS — I82409 Acute embolism and thrombosis of unspecified deep veins of unspecified lower extremity: Secondary | ICD-10-CM | POA: Diagnosis not present

## 2023-07-24 DIAGNOSIS — N1831 Chronic kidney disease, stage 3a: Secondary | ICD-10-CM | POA: Diagnosis not present

## 2023-07-24 DIAGNOSIS — E440 Moderate protein-calorie malnutrition: Secondary | ICD-10-CM | POA: Diagnosis not present

## 2023-07-24 DIAGNOSIS — Z86711 Personal history of pulmonary embolism: Secondary | ICD-10-CM | POA: Diagnosis not present

## 2023-07-24 DIAGNOSIS — E785 Hyperlipidemia, unspecified: Secondary | ICD-10-CM | POA: Diagnosis not present

## 2023-07-24 DIAGNOSIS — I130 Hypertensive heart and chronic kidney disease with heart failure and stage 1 through stage 4 chronic kidney disease, or unspecified chronic kidney disease: Secondary | ICD-10-CM | POA: Diagnosis not present

## 2023-07-24 DIAGNOSIS — N179 Acute kidney failure, unspecified: Secondary | ICD-10-CM | POA: Diagnosis not present

## 2023-07-24 DIAGNOSIS — Z9981 Dependence on supplemental oxygen: Secondary | ICD-10-CM | POA: Diagnosis not present

## 2023-07-24 DIAGNOSIS — M109 Gout, unspecified: Secondary | ICD-10-CM | POA: Diagnosis not present

## 2023-07-24 DIAGNOSIS — J9601 Acute respiratory failure with hypoxia: Secondary | ICD-10-CM | POA: Diagnosis not present

## 2023-07-24 DIAGNOSIS — A419 Sepsis, unspecified organism: Secondary | ICD-10-CM | POA: Diagnosis not present

## 2023-07-24 DIAGNOSIS — I441 Atrioventricular block, second degree: Secondary | ICD-10-CM | POA: Diagnosis not present

## 2023-07-24 DIAGNOSIS — I5021 Acute systolic (congestive) heart failure: Secondary | ICD-10-CM | POA: Diagnosis not present

## 2023-07-24 DIAGNOSIS — I5033 Acute on chronic diastolic (congestive) heart failure: Secondary | ICD-10-CM | POA: Diagnosis not present

## 2023-07-24 DIAGNOSIS — Z8673 Personal history of transient ischemic attack (TIA), and cerebral infarction without residual deficits: Secondary | ICD-10-CM | POA: Diagnosis not present

## 2023-07-24 NOTE — Telephone Encounter (Signed)
 Spoke with daughter Camelia Eng. She stated that the pt saw his PCP yesterday and he increased his lasix to 40mg  Bid. Pt will follow up with Cardiology on 07-26-23

## 2023-07-25 DIAGNOSIS — Z8673 Personal history of transient ischemic attack (TIA), and cerebral infarction without residual deficits: Secondary | ICD-10-CM | POA: Diagnosis not present

## 2023-07-25 DIAGNOSIS — E1122 Type 2 diabetes mellitus with diabetic chronic kidney disease: Secondary | ICD-10-CM | POA: Diagnosis not present

## 2023-07-25 DIAGNOSIS — E440 Moderate protein-calorie malnutrition: Secondary | ICD-10-CM | POA: Diagnosis not present

## 2023-07-25 DIAGNOSIS — Z9981 Dependence on supplemental oxygen: Secondary | ICD-10-CM | POA: Diagnosis not present

## 2023-07-25 DIAGNOSIS — I441 Atrioventricular block, second degree: Secondary | ICD-10-CM | POA: Diagnosis not present

## 2023-07-25 DIAGNOSIS — K219 Gastro-esophageal reflux disease without esophagitis: Secondary | ICD-10-CM | POA: Diagnosis not present

## 2023-07-25 DIAGNOSIS — N3090 Cystitis, unspecified without hematuria: Secondary | ICD-10-CM | POA: Diagnosis not present

## 2023-07-25 DIAGNOSIS — N179 Acute kidney failure, unspecified: Secondary | ICD-10-CM | POA: Diagnosis not present

## 2023-07-25 DIAGNOSIS — I5033 Acute on chronic diastolic (congestive) heart failure: Secondary | ICD-10-CM | POA: Diagnosis not present

## 2023-07-25 DIAGNOSIS — N1831 Chronic kidney disease, stage 3a: Secondary | ICD-10-CM | POA: Diagnosis not present

## 2023-07-25 DIAGNOSIS — A419 Sepsis, unspecified organism: Secondary | ICD-10-CM | POA: Diagnosis not present

## 2023-07-25 DIAGNOSIS — I82409 Acute embolism and thrombosis of unspecified deep veins of unspecified lower extremity: Secondary | ICD-10-CM | POA: Diagnosis not present

## 2023-07-25 DIAGNOSIS — M109 Gout, unspecified: Secondary | ICD-10-CM | POA: Diagnosis not present

## 2023-07-25 DIAGNOSIS — E785 Hyperlipidemia, unspecified: Secondary | ICD-10-CM | POA: Diagnosis not present

## 2023-07-25 DIAGNOSIS — Z86711 Personal history of pulmonary embolism: Secondary | ICD-10-CM | POA: Diagnosis not present

## 2023-07-25 DIAGNOSIS — I130 Hypertensive heart and chronic kidney disease with heart failure and stage 1 through stage 4 chronic kidney disease, or unspecified chronic kidney disease: Secondary | ICD-10-CM | POA: Diagnosis not present

## 2023-07-25 DIAGNOSIS — Z7901 Long term (current) use of anticoagulants: Secondary | ICD-10-CM | POA: Diagnosis not present

## 2023-07-25 DIAGNOSIS — J9601 Acute respiratory failure with hypoxia: Secondary | ICD-10-CM | POA: Diagnosis not present

## 2023-07-25 DIAGNOSIS — I5021 Acute systolic (congestive) heart failure: Secondary | ICD-10-CM | POA: Diagnosis not present

## 2023-07-26 ENCOUNTER — Encounter: Payer: Self-pay | Admitting: Cardiology

## 2023-07-26 ENCOUNTER — Ambulatory Visit: Payer: Medicare HMO | Attending: Cardiology | Admitting: Cardiology

## 2023-07-26 VITALS — BP 110/72 | HR 70 | Ht 72.0 in | Wt 232.2 lb

## 2023-07-26 DIAGNOSIS — I441 Atrioventricular block, second degree: Secondary | ICD-10-CM | POA: Diagnosis not present

## 2023-07-26 DIAGNOSIS — E785 Hyperlipidemia, unspecified: Secondary | ICD-10-CM | POA: Diagnosis not present

## 2023-07-26 DIAGNOSIS — I5022 Chronic systolic (congestive) heart failure: Secondary | ICD-10-CM

## 2023-07-26 DIAGNOSIS — I10 Essential (primary) hypertension: Secondary | ICD-10-CM | POA: Diagnosis not present

## 2023-07-26 DIAGNOSIS — Z86711 Personal history of pulmonary embolism: Secondary | ICD-10-CM | POA: Diagnosis not present

## 2023-07-26 NOTE — Patient Instructions (Signed)
 Medication Instructions:  Your physician recommends that you continue on your current medications as directed. Please refer to the Current Medication list given to you today.  *If you need a refill on your cardiac medications before your next appointment, please call your pharmacy*   Lab Work: CMP- Today If you have labs (blood work) drawn today and your tests are completely normal, you will receive your results only by: MyChart Message (if you have MyChart) OR A paper copy in the mail If you have any lab test that is abnormal or we need to change your treatment, we will call you to review the results.   Testing/Procedures: None Ordered   Follow-Up: At [facility], you and your health needs are our priority.  As part of our continuing mission to provide you with exceptional heart care, we have created designated Provider Care Teams.  These Care Teams include your primary Cardiologist (physician) and Advanced Practice Providers (APPs -  Physician Assistants and Nurse Practitioners) who all work together to provide you with the care you need, when you need it.  We recommend signing up for the patient portal called "MyChart".  Sign up information is provided on this After Visit Summary.  MyChart is used to connect with patients for Virtual Visits (Telemedicine).  Patients are able to view lab/test results, encounter notes, upcoming appointments, etc.  Non-urgent messages can be sent to your provider as well.   To learn more about what you can do with MyChart, go to [email or web address].    Your next appointment:   2 month(s)  The format for your next appointment:   In Person  Provider:   Gypsy Balsam, MD    Other Instructions NA

## 2023-07-26 NOTE — Progress Notes (Signed)
 Cardiology Office Note:    Date:  07/26/2023   ID:  Benjamin Woods, DOB 03-18-1941, MRN [id number]  PCP:  [name], MD  Cardiologist:  Gypsy Balsam, MD    Referring MD: [name], MD   Chief Complaint  Patient presents with   Hospitalization Follow-up    History of Present Illness:    Benjamin Woods is a 83 y.o. male past medical history significant for cardiomyopathy initially discovered in August 2020 ejection fraction 25 to 30% after that he was put on guideline directed medical therapy that he was able to tolerate improvement left ventricle ejection fraction to 4045% and then close to normal, also history of pulmonary emboli and DVT, essential hypertension dyslipidemia history of CVA, GERD, advanced gout. Comes today to my office after recently being admitted to [facility] because of decompensated CHF, manage excellent improved quite significantly put on guideline directed medical therapy the issue was kidney dysfunction so not all appropriate medication has been already started comes today for follow-up doing well still have 1+ swelling lower extremities no shortness of breath no paroxysmal nocturnal dyspnea overall seems to be doing quite well  Past Medical History:  Diagnosis Date   Acute combined systolic and diastolic congestive heart failure (HCC) 12/10/2018   Acute combined systolic and diastolic heart failure (HCC)    BPH (benign prostatic hyperplasia)    Cardiomyopathy (HCC)    DVT (deep venous thrombosis) (HCC) 12/10/2018   Elevated troponin 12/10/2018   GERD (gastroesophageal reflux disease)    Gout    History of pulmonary embolism 12/22/2019   HTN (hypertension)    Hypocalcemia    Hypokalemia    Pressure injury of skin 12/11/2018   Protein-calorie malnutrition, moderate (HCC) 12/10/2018   Pulmonary embolism (HCC)    Stroke (cerebrum) (HCC)    Syncope    Systolic congestive heart failure (HCC) 01/30/2019    Past Surgical History:   Procedure Laterality Date   HAND SURGERY      Current Medications: Current Meds  Medication Sig   allopurinol (ZYLOPRIM) 300 MG tablet Take 300 mg by mouth daily.   apixaban (ELIQUIS) 5 MG TABS tablet Take 5 mg by mouth 2 (two) times daily.   atorvastatin (LIPITOR) 20 MG tablet Take 1 tablet (20 mg total) by mouth daily.   carvedilol (COREG) 3.125 MG tablet Take 1 tablet (3.125 mg total) by mouth 2 (two) times daily with a meal.   cetirizine (ZYRTEC) 10 MG tablet Take 10 mg by mouth daily.   colchicine 0.6 MG tablet Take 0.6 mg by mouth daily.   DEXILANT 60 MG capsule Take 1 capsule by mouth daily.   Febuxostat 80 MG TABS Take 80 mg by mouth daily.   furosemide (LASIX) 20 MG tablet Take 2 tablets (40 mg total) by mouth daily.   guaiFENesin (MUCINEX) 600 MG 12 hr tablet Take 600 mg by mouth 2 (two) times daily.   latanoprost (XALATAN) 0.005 % ophthalmic solution Place 1 drop into both eyes at bedtime.   sildenafil (VIAGRA) 100 MG tablet Take 100 mg by mouth daily as needed for erectile dysfunction. One hour prior to sex   tamsulosin (FLOMAX) 0.4 MG CAPS capsule Take 0.4 mg by mouth 2 (two) times daily.     Allergies:   Patient has no known allergies.   Social History   Socioeconomic History   Marital status: Married    Spouse name: Not on file   Number of children: Not on file  Years of education: Not on file   Highest education level: Not on file  Occupational History   Not on file  Tobacco Use   Smoking status: Former   Smokeless tobacco: Never  Vaping Use   Vaping status: Never Used  Substance and Sexual Activity   Alcohol use: Not Currently   Drug use: Never   Sexual activity: Not Currently  Other Topics Concern   Not on file  Social History Narrative   Not on file   Social Drivers of Health   Financial Resource Strain: Low Risk  (08/24/2022)   Overall Financial Resource Strain (CARDIA)    Difficulty of Paying Living Expenses: Not hard at all  Food  Insecurity: No Food Insecurity (07/11/2023)   Hunger Vital Sign    Worried About Running Out of Food in the Last Year: Never true    Ran Out of Food in the Last Year: Never true  Transportation Needs: No Transportation Needs (07/11/2023)   PRAPARE - Administrator, Civil Service (Medical): No    Lack of Transportation (Non-Medical): No  Physical Activity: Insufficiently Active (08/24/2022)   Exercise Vital Sign    Days of Exercise per Week: 3 days    Minutes of Exercise per Session: 10 min  Stress: Not on file  Social Connections: Moderately Isolated (07/11/2023)   Social Connection and Isolation Panel [NHANES]    Frequency of Communication with Friends and Family: Once a week    Frequency of Social Gatherings with Friends and Family: Once a week    Attends Religious Services: 1 to 4 times per year    Active Member of [employer] or Organizations: No    Attends Banker Meetings: Never    Marital Status: Married     Family History: The patient's family history is not on file. ROS:   Please see the history of present illness.    All 14 point review of systems negative except as described per history of present illness  EKGs/Labs/Other Studies Reviewed:         Recent Labs: 07/10/2023: B Natriuretic Peptide 426.6 07/11/2023: ALT 26; TSH 4.335 07/13/2023: Hemoglobin 12.1; Platelets 104 07/16/2023: BUN 41; Creatinine, Ser 1.65; Magnesium 2.1; Potassium 4.1; Sodium 138  Recent Lipid Panel No results found for: "CHOL", "TRIG", "HDL", "CHOLHDL", "VLDL", "LDLCALC", "LDLDIRECT"  Physical Exam:    VS:  BP 110/72 (BP Location: Right Arm, Patient Position: Sitting)   Pulse 70   Ht 6' (1.829 m)   Wt 232 lb 3.2 oz (105.3 kg)   SpO2 94%   BMI 31.49 kg/m     Wt Readings from Last 3 Encounters:  07/26/23 232 lb 3.2 oz (105.3 kg)  07/16/23 228 lb 9.9 oz (103.7 kg)  01/14/23 222 lb 12.8 oz (101.1 kg)     GEN:  Well nourished, well developed in no acute distress HEENT:  Normal NECK: No JVD; No carotid bruits LYMPHATICS: No lymphadenopathy CARDIAC: RRR, no murmurs, no rubs, no gallops RESPIRATORY:  Clear to auscultation without rales, wheezing or rhonchi  ABDOMEN: Soft, non-tender, non-distended MUSCULOSKELETAL:  No edema; No deformity  SKIN: Warm and dry LOWER EXTREMITIES: 1+ swelling NEUROLOGIC:  Alert and oriented x 3 PSYCHIATRIC:  Normal affect   ASSESSMENT:    1. Chronic systolic congestive heart failure (HCC)   2. Mobitz type 1 second degree AV block   3. Primary hypertension   4. Dyslipidemia   5. History of pulmonary embolism    PLAN:    In  order of problems listed above:  Chronic congestive heart failure: Doing well from that point review.  Seems to be compensated he checked his weight every day tell me that weight is still going down.  I will check his Chem-7 and proBNP if Chem-7 is fine we will try to implement either ARB or ideally Entresto. History of Mobitz type second-degree AV block, very small dose of carvedilol 3.125, no dizziness no passing out continue monitoring. Essential hypertension: Blood pressure well-controlled continue present management:. Dyslipidemia I did review K PN which show me his LDL of 40 HDL 47 this is from November of last year he is taking Lipitor 20 which I will continue. History of PE and DVT continue with anticoagulation   Medication Adjustments/Labs and Tests Ordered: Current medicines are reviewed at length with the patient today.  Concerns regarding medicines are outlined above.  No orders of the defined types were placed in this encounter.  Medication changes: No orders of the defined types were placed in this encounter.   Signed, Georgeanna Lea, MD, [facility] 07/26/2023 11:36 AM    [facility]

## 2023-07-29 DIAGNOSIS — Z8673 Personal history of transient ischemic attack (TIA), and cerebral infarction without residual deficits: Secondary | ICD-10-CM | POA: Diagnosis not present

## 2023-07-29 DIAGNOSIS — N1831 Chronic kidney disease, stage 3a: Secondary | ICD-10-CM | POA: Diagnosis not present

## 2023-07-29 DIAGNOSIS — E785 Hyperlipidemia, unspecified: Secondary | ICD-10-CM | POA: Diagnosis not present

## 2023-07-29 DIAGNOSIS — A419 Sepsis, unspecified organism: Secondary | ICD-10-CM | POA: Diagnosis not present

## 2023-07-29 DIAGNOSIS — K219 Gastro-esophageal reflux disease without esophagitis: Secondary | ICD-10-CM | POA: Diagnosis not present

## 2023-07-29 DIAGNOSIS — E1122 Type 2 diabetes mellitus with diabetic chronic kidney disease: Secondary | ICD-10-CM | POA: Diagnosis not present

## 2023-07-29 DIAGNOSIS — I441 Atrioventricular block, second degree: Secondary | ICD-10-CM | POA: Diagnosis not present

## 2023-07-29 DIAGNOSIS — I82409 Acute embolism and thrombosis of unspecified deep veins of unspecified lower extremity: Secondary | ICD-10-CM | POA: Diagnosis not present

## 2023-07-29 DIAGNOSIS — I5033 Acute on chronic diastolic (congestive) heart failure: Secondary | ICD-10-CM | POA: Diagnosis not present

## 2023-07-29 DIAGNOSIS — I5021 Acute systolic (congestive) heart failure: Secondary | ICD-10-CM | POA: Diagnosis not present

## 2023-07-29 DIAGNOSIS — Z86711 Personal history of pulmonary embolism: Secondary | ICD-10-CM | POA: Diagnosis not present

## 2023-07-29 DIAGNOSIS — Z7901 Long term (current) use of anticoagulants: Secondary | ICD-10-CM | POA: Diagnosis not present

## 2023-07-29 DIAGNOSIS — M109 Gout, unspecified: Secondary | ICD-10-CM | POA: Diagnosis not present

## 2023-07-29 DIAGNOSIS — J9601 Acute respiratory failure with hypoxia: Secondary | ICD-10-CM | POA: Diagnosis not present

## 2023-07-29 DIAGNOSIS — E440 Moderate protein-calorie malnutrition: Secondary | ICD-10-CM | POA: Diagnosis not present

## 2023-07-29 DIAGNOSIS — Z9981 Dependence on supplemental oxygen: Secondary | ICD-10-CM | POA: Diagnosis not present

## 2023-07-29 DIAGNOSIS — I130 Hypertensive heart and chronic kidney disease with heart failure and stage 1 through stage 4 chronic kidney disease, or unspecified chronic kidney disease: Secondary | ICD-10-CM | POA: Diagnosis not present

## 2023-07-29 DIAGNOSIS — N179 Acute kidney failure, unspecified: Secondary | ICD-10-CM | POA: Diagnosis not present

## 2023-07-29 DIAGNOSIS — N3090 Cystitis, unspecified without hematuria: Secondary | ICD-10-CM | POA: Diagnosis not present

## 2023-07-31 DIAGNOSIS — E785 Hyperlipidemia, unspecified: Secondary | ICD-10-CM | POA: Diagnosis not present

## 2023-07-31 DIAGNOSIS — E440 Moderate protein-calorie malnutrition: Secondary | ICD-10-CM | POA: Diagnosis not present

## 2023-07-31 DIAGNOSIS — Z86711 Personal history of pulmonary embolism: Secondary | ICD-10-CM | POA: Diagnosis not present

## 2023-07-31 DIAGNOSIS — M109 Gout, unspecified: Secondary | ICD-10-CM | POA: Diagnosis not present

## 2023-07-31 DIAGNOSIS — N179 Acute kidney failure, unspecified: Secondary | ICD-10-CM | POA: Diagnosis not present

## 2023-07-31 DIAGNOSIS — Z8673 Personal history of transient ischemic attack (TIA), and cerebral infarction without residual deficits: Secondary | ICD-10-CM | POA: Diagnosis not present

## 2023-07-31 DIAGNOSIS — N3090 Cystitis, unspecified without hematuria: Secondary | ICD-10-CM | POA: Diagnosis not present

## 2023-07-31 DIAGNOSIS — K219 Gastro-esophageal reflux disease without esophagitis: Secondary | ICD-10-CM | POA: Diagnosis not present

## 2023-07-31 DIAGNOSIS — I441 Atrioventricular block, second degree: Secondary | ICD-10-CM | POA: Diagnosis not present

## 2023-07-31 DIAGNOSIS — Z9981 Dependence on supplemental oxygen: Secondary | ICD-10-CM | POA: Diagnosis not present

## 2023-07-31 DIAGNOSIS — N1831 Chronic kidney disease, stage 3a: Secondary | ICD-10-CM | POA: Diagnosis not present

## 2023-07-31 DIAGNOSIS — A419 Sepsis, unspecified organism: Secondary | ICD-10-CM | POA: Diagnosis not present

## 2023-07-31 DIAGNOSIS — I5033 Acute on chronic diastolic (congestive) heart failure: Secondary | ICD-10-CM | POA: Diagnosis not present

## 2023-07-31 DIAGNOSIS — I5021 Acute systolic (congestive) heart failure: Secondary | ICD-10-CM | POA: Diagnosis not present

## 2023-07-31 DIAGNOSIS — I130 Hypertensive heart and chronic kidney disease with heart failure and stage 1 through stage 4 chronic kidney disease, or unspecified chronic kidney disease: Secondary | ICD-10-CM | POA: Diagnosis not present

## 2023-07-31 DIAGNOSIS — J9601 Acute respiratory failure with hypoxia: Secondary | ICD-10-CM | POA: Diagnosis not present

## 2023-07-31 DIAGNOSIS — Z7901 Long term (current) use of anticoagulants: Secondary | ICD-10-CM | POA: Diagnosis not present

## 2023-07-31 DIAGNOSIS — E1122 Type 2 diabetes mellitus with diabetic chronic kidney disease: Secondary | ICD-10-CM | POA: Diagnosis not present

## 2023-07-31 DIAGNOSIS — I82409 Acute embolism and thrombosis of unspecified deep veins of unspecified lower extremity: Secondary | ICD-10-CM | POA: Diagnosis not present

## 2023-07-31 LAB — COMPREHENSIVE METABOLIC PANEL
ALT: 34 IU/L (ref 0–44)
AST: 37 IU/L (ref 0–40)
Albumin: 3.8 g/dL (ref 3.7–4.7)
Alkaline Phosphatase: 169 IU/L — ABNORMAL HIGH (ref 44–121)
BUN/Creatinine Ratio: 18 (ref 10–24)
BUN: 29 mg/dL — ABNORMAL HIGH (ref 8–27)
Bilirubin Total: 0.3 mg/dL (ref 0.0–1.2)
CO2: 21 mmol/L (ref 20–29)
Calcium: 9.2 mg/dL (ref 8.6–10.2)
Chloride: 99 mmol/L (ref 96–106)
Creatinine, Ser: 1.63 mg/dL — ABNORMAL HIGH (ref 0.76–1.27)
Globulin, Total: 4.3 g/dL (ref 1.5–4.5)
Glucose: 162 mg/dL — ABNORMAL HIGH (ref 70–99)
Potassium: 4.1 mmol/L (ref 3.5–5.2)
Sodium: 138 mmol/L (ref 134–144)
Total Protein: 8.1 g/dL (ref 6.0–8.5)
eGFR: 42 mL/min/{1.73_m2} — ABNORMAL LOW (ref >59)

## 2023-08-01 ENCOUNTER — Telehealth: Payer: Self-pay

## 2023-08-01 DIAGNOSIS — E785 Hyperlipidemia, unspecified: Secondary | ICD-10-CM | POA: Diagnosis not present

## 2023-08-01 DIAGNOSIS — N3090 Cystitis, unspecified without hematuria: Secondary | ICD-10-CM | POA: Diagnosis not present

## 2023-08-01 DIAGNOSIS — Z86711 Personal history of pulmonary embolism: Secondary | ICD-10-CM | POA: Diagnosis not present

## 2023-08-01 DIAGNOSIS — K219 Gastro-esophageal reflux disease without esophagitis: Secondary | ICD-10-CM | POA: Diagnosis not present

## 2023-08-01 DIAGNOSIS — J9601 Acute respiratory failure with hypoxia: Secondary | ICD-10-CM | POA: Diagnosis not present

## 2023-08-01 DIAGNOSIS — E1122 Type 2 diabetes mellitus with diabetic chronic kidney disease: Secondary | ICD-10-CM | POA: Diagnosis not present

## 2023-08-01 DIAGNOSIS — E440 Moderate protein-calorie malnutrition: Secondary | ICD-10-CM | POA: Diagnosis not present

## 2023-08-01 DIAGNOSIS — Z7901 Long term (current) use of anticoagulants: Secondary | ICD-10-CM | POA: Diagnosis not present

## 2023-08-01 DIAGNOSIS — I441 Atrioventricular block, second degree: Secondary | ICD-10-CM | POA: Diagnosis not present

## 2023-08-01 DIAGNOSIS — I5033 Acute on chronic diastolic (congestive) heart failure: Secondary | ICD-10-CM | POA: Diagnosis not present

## 2023-08-01 DIAGNOSIS — I82409 Acute embolism and thrombosis of unspecified deep veins of unspecified lower extremity: Secondary | ICD-10-CM | POA: Diagnosis not present

## 2023-08-01 DIAGNOSIS — Z8673 Personal history of transient ischemic attack (TIA), and cerebral infarction without residual deficits: Secondary | ICD-10-CM | POA: Diagnosis not present

## 2023-08-01 DIAGNOSIS — I130 Hypertensive heart and chronic kidney disease with heart failure and stage 1 through stage 4 chronic kidney disease, or unspecified chronic kidney disease: Secondary | ICD-10-CM | POA: Diagnosis not present

## 2023-08-01 DIAGNOSIS — I5021 Acute systolic (congestive) heart failure: Secondary | ICD-10-CM | POA: Diagnosis not present

## 2023-08-01 DIAGNOSIS — N1831 Chronic kidney disease, stage 3a: Secondary | ICD-10-CM | POA: Diagnosis not present

## 2023-08-01 DIAGNOSIS — A419 Sepsis, unspecified organism: Secondary | ICD-10-CM | POA: Diagnosis not present

## 2023-08-01 DIAGNOSIS — N179 Acute kidney failure, unspecified: Secondary | ICD-10-CM | POA: Diagnosis not present

## 2023-08-01 DIAGNOSIS — Z9981 Dependence on supplemental oxygen: Secondary | ICD-10-CM | POA: Diagnosis not present

## 2023-08-01 DIAGNOSIS — M109 Gout, unspecified: Secondary | ICD-10-CM | POA: Diagnosis not present

## 2023-08-01 NOTE — Telephone Encounter (Signed)
-----   Message from Gypsy Balsam sent at 08/01/2023  1:38 PM EDT ----- Kidney function still elevated, continue present management

## 2023-08-01 NOTE — Telephone Encounter (Signed)
 Patient notified through my chart.

## 2023-08-06 DIAGNOSIS — N1831 Chronic kidney disease, stage 3a: Secondary | ICD-10-CM | POA: Diagnosis not present

## 2023-08-06 DIAGNOSIS — N179 Acute kidney failure, unspecified: Secondary | ICD-10-CM | POA: Diagnosis not present

## 2023-08-06 DIAGNOSIS — Z9981 Dependence on supplemental oxygen: Secondary | ICD-10-CM | POA: Diagnosis not present

## 2023-08-06 DIAGNOSIS — Z8673 Personal history of transient ischemic attack (TIA), and cerebral infarction without residual deficits: Secondary | ICD-10-CM | POA: Diagnosis not present

## 2023-08-06 DIAGNOSIS — M109 Gout, unspecified: Secondary | ICD-10-CM | POA: Diagnosis not present

## 2023-08-06 DIAGNOSIS — I82409 Acute embolism and thrombosis of unspecified deep veins of unspecified lower extremity: Secondary | ICD-10-CM | POA: Diagnosis not present

## 2023-08-06 DIAGNOSIS — I5033 Acute on chronic diastolic (congestive) heart failure: Secondary | ICD-10-CM | POA: Diagnosis not present

## 2023-08-06 DIAGNOSIS — E785 Hyperlipidemia, unspecified: Secondary | ICD-10-CM | POA: Diagnosis not present

## 2023-08-06 DIAGNOSIS — J9601 Acute respiratory failure with hypoxia: Secondary | ICD-10-CM | POA: Diagnosis not present

## 2023-08-06 DIAGNOSIS — K219 Gastro-esophageal reflux disease without esophagitis: Secondary | ICD-10-CM | POA: Diagnosis not present

## 2023-08-06 DIAGNOSIS — N3090 Cystitis, unspecified without hematuria: Secondary | ICD-10-CM | POA: Diagnosis not present

## 2023-08-06 DIAGNOSIS — Z7901 Long term (current) use of anticoagulants: Secondary | ICD-10-CM | POA: Diagnosis not present

## 2023-08-06 DIAGNOSIS — A419 Sepsis, unspecified organism: Secondary | ICD-10-CM | POA: Diagnosis not present

## 2023-08-06 DIAGNOSIS — E440 Moderate protein-calorie malnutrition: Secondary | ICD-10-CM | POA: Diagnosis not present

## 2023-08-06 DIAGNOSIS — I5021 Acute systolic (congestive) heart failure: Secondary | ICD-10-CM | POA: Diagnosis not present

## 2023-08-06 DIAGNOSIS — E1122 Type 2 diabetes mellitus with diabetic chronic kidney disease: Secondary | ICD-10-CM | POA: Diagnosis not present

## 2023-08-06 DIAGNOSIS — I130 Hypertensive heart and chronic kidney disease with heart failure and stage 1 through stage 4 chronic kidney disease, or unspecified chronic kidney disease: Secondary | ICD-10-CM | POA: Diagnosis not present

## 2023-08-06 DIAGNOSIS — Z86711 Personal history of pulmonary embolism: Secondary | ICD-10-CM | POA: Diagnosis not present

## 2023-08-06 DIAGNOSIS — I441 Atrioventricular block, second degree: Secondary | ICD-10-CM | POA: Diagnosis not present

## 2023-08-08 DIAGNOSIS — I5020 Unspecified systolic (congestive) heart failure: Secondary | ICD-10-CM | POA: Diagnosis not present

## 2023-08-08 DIAGNOSIS — N183 Chronic kidney disease, stage 3 unspecified: Secondary | ICD-10-CM | POA: Diagnosis not present

## 2023-08-09 DIAGNOSIS — Z86711 Personal history of pulmonary embolism: Secondary | ICD-10-CM | POA: Diagnosis not present

## 2023-08-09 DIAGNOSIS — N1831 Chronic kidney disease, stage 3a: Secondary | ICD-10-CM | POA: Diagnosis not present

## 2023-08-09 DIAGNOSIS — N3090 Cystitis, unspecified without hematuria: Secondary | ICD-10-CM | POA: Diagnosis not present

## 2023-08-09 DIAGNOSIS — Z9981 Dependence on supplemental oxygen: Secondary | ICD-10-CM | POA: Diagnosis not present

## 2023-08-09 DIAGNOSIS — K219 Gastro-esophageal reflux disease without esophagitis: Secondary | ICD-10-CM | POA: Diagnosis not present

## 2023-08-09 DIAGNOSIS — I5021 Acute systolic (congestive) heart failure: Secondary | ICD-10-CM | POA: Diagnosis not present

## 2023-08-09 DIAGNOSIS — A419 Sepsis, unspecified organism: Secondary | ICD-10-CM | POA: Diagnosis not present

## 2023-08-09 DIAGNOSIS — Z8673 Personal history of transient ischemic attack (TIA), and cerebral infarction without residual deficits: Secondary | ICD-10-CM | POA: Diagnosis not present

## 2023-08-09 DIAGNOSIS — E785 Hyperlipidemia, unspecified: Secondary | ICD-10-CM | POA: Diagnosis not present

## 2023-08-09 DIAGNOSIS — J9601 Acute respiratory failure with hypoxia: Secondary | ICD-10-CM | POA: Diagnosis not present

## 2023-08-09 DIAGNOSIS — E440 Moderate protein-calorie malnutrition: Secondary | ICD-10-CM | POA: Diagnosis not present

## 2023-08-09 DIAGNOSIS — I130 Hypertensive heart and chronic kidney disease with heart failure and stage 1 through stage 4 chronic kidney disease, or unspecified chronic kidney disease: Secondary | ICD-10-CM | POA: Diagnosis not present

## 2023-08-09 DIAGNOSIS — I82409 Acute embolism and thrombosis of unspecified deep veins of unspecified lower extremity: Secondary | ICD-10-CM | POA: Diagnosis not present

## 2023-08-09 DIAGNOSIS — Z7901 Long term (current) use of anticoagulants: Secondary | ICD-10-CM | POA: Diagnosis not present

## 2023-08-09 DIAGNOSIS — I441 Atrioventricular block, second degree: Secondary | ICD-10-CM | POA: Diagnosis not present

## 2023-08-09 DIAGNOSIS — I5033 Acute on chronic diastolic (congestive) heart failure: Secondary | ICD-10-CM | POA: Diagnosis not present

## 2023-08-09 DIAGNOSIS — N179 Acute kidney failure, unspecified: Secondary | ICD-10-CM | POA: Diagnosis not present

## 2023-08-09 DIAGNOSIS — M109 Gout, unspecified: Secondary | ICD-10-CM | POA: Diagnosis not present

## 2023-08-09 DIAGNOSIS — E1122 Type 2 diabetes mellitus with diabetic chronic kidney disease: Secondary | ICD-10-CM | POA: Diagnosis not present

## 2023-08-13 DIAGNOSIS — I5021 Acute systolic (congestive) heart failure: Secondary | ICD-10-CM | POA: Diagnosis not present

## 2023-08-13 DIAGNOSIS — M109 Gout, unspecified: Secondary | ICD-10-CM | POA: Diagnosis not present

## 2023-08-13 DIAGNOSIS — N3090 Cystitis, unspecified without hematuria: Secondary | ICD-10-CM | POA: Diagnosis not present

## 2023-08-13 DIAGNOSIS — I82409 Acute embolism and thrombosis of unspecified deep veins of unspecified lower extremity: Secondary | ICD-10-CM | POA: Diagnosis not present

## 2023-08-13 DIAGNOSIS — E785 Hyperlipidemia, unspecified: Secondary | ICD-10-CM | POA: Diagnosis not present

## 2023-08-13 DIAGNOSIS — I441 Atrioventricular block, second degree: Secondary | ICD-10-CM | POA: Diagnosis not present

## 2023-08-13 DIAGNOSIS — A419 Sepsis, unspecified organism: Secondary | ICD-10-CM | POA: Diagnosis not present

## 2023-08-13 DIAGNOSIS — I5033 Acute on chronic diastolic (congestive) heart failure: Secondary | ICD-10-CM | POA: Diagnosis not present

## 2023-08-13 DIAGNOSIS — E440 Moderate protein-calorie malnutrition: Secondary | ICD-10-CM | POA: Diagnosis not present

## 2023-08-13 DIAGNOSIS — Z8673 Personal history of transient ischemic attack (TIA), and cerebral infarction without residual deficits: Secondary | ICD-10-CM | POA: Diagnosis not present

## 2023-08-13 DIAGNOSIS — Z7901 Long term (current) use of anticoagulants: Secondary | ICD-10-CM | POA: Diagnosis not present

## 2023-08-13 DIAGNOSIS — J9601 Acute respiratory failure with hypoxia: Secondary | ICD-10-CM | POA: Diagnosis not present

## 2023-08-13 DIAGNOSIS — Z9981 Dependence on supplemental oxygen: Secondary | ICD-10-CM | POA: Diagnosis not present

## 2023-08-13 DIAGNOSIS — K219 Gastro-esophageal reflux disease without esophagitis: Secondary | ICD-10-CM | POA: Diagnosis not present

## 2023-08-13 DIAGNOSIS — E1122 Type 2 diabetes mellitus with diabetic chronic kidney disease: Secondary | ICD-10-CM | POA: Diagnosis not present

## 2023-08-13 DIAGNOSIS — N179 Acute kidney failure, unspecified: Secondary | ICD-10-CM | POA: Diagnosis not present

## 2023-08-13 DIAGNOSIS — N1831 Chronic kidney disease, stage 3a: Secondary | ICD-10-CM | POA: Diagnosis not present

## 2023-08-13 DIAGNOSIS — I130 Hypertensive heart and chronic kidney disease with heart failure and stage 1 through stage 4 chronic kidney disease, or unspecified chronic kidney disease: Secondary | ICD-10-CM | POA: Diagnosis not present

## 2023-08-13 DIAGNOSIS — Z86711 Personal history of pulmonary embolism: Secondary | ICD-10-CM | POA: Diagnosis not present

## 2023-08-28 ENCOUNTER — Encounter: Payer: Self-pay | Admitting: Cardiology

## 2023-08-28 ENCOUNTER — Other Ambulatory Visit: Payer: Self-pay

## 2023-08-28 DIAGNOSIS — I10 Essential (primary) hypertension: Secondary | ICD-10-CM | POA: Diagnosis not present

## 2023-08-28 DIAGNOSIS — I5020 Unspecified systolic (congestive) heart failure: Secondary | ICD-10-CM | POA: Diagnosis not present

## 2023-08-29 ENCOUNTER — Telehealth: Payer: Self-pay | Admitting: Cardiology

## 2023-08-29 NOTE — Telephone Encounter (Signed)
 Pt c/o medication issue:  1. Name of Medication: ENTRESTO  24-26 MG   2. How are you currently taking this medication (dosage and times per day)? No  3. Are you having a reaction (difficulty breathing--STAT)? No  4. What is your medication issue? Pt was told by his PCP to ask if he should start taking this medication again. Please advise

## 2023-08-29 NOTE — Telephone Encounter (Signed)
 Advised pt that Dr. Krasowski did not want to start Entresto  at this time because his Kidney function was elevated. He wants him to continue his present medications. Pt verbalized understanding and had no further questions.

## 2023-09-04 DIAGNOSIS — R339 Retention of urine, unspecified: Secondary | ICD-10-CM | POA: Diagnosis not present

## 2023-09-23 DIAGNOSIS — J9811 Atelectasis: Secondary | ICD-10-CM | POA: Diagnosis not present

## 2023-09-23 DIAGNOSIS — I498 Other specified cardiac arrhythmias: Secondary | ICD-10-CM | POA: Diagnosis not present

## 2023-09-23 DIAGNOSIS — Z7901 Long term (current) use of anticoagulants: Secondary | ICD-10-CM | POA: Diagnosis not present

## 2023-09-23 DIAGNOSIS — Z9981 Dependence on supplemental oxygen: Secondary | ICD-10-CM | POA: Diagnosis not present

## 2023-09-23 DIAGNOSIS — R0609 Other forms of dyspnea: Secondary | ICD-10-CM | POA: Diagnosis not present

## 2023-09-23 DIAGNOSIS — R040 Epistaxis: Secondary | ICD-10-CM | POA: Diagnosis not present

## 2023-09-23 DIAGNOSIS — R58 Hemorrhage, not elsewhere classified: Secondary | ICD-10-CM | POA: Diagnosis not present

## 2023-09-23 DIAGNOSIS — I10 Essential (primary) hypertension: Secondary | ICD-10-CM | POA: Diagnosis not present

## 2023-09-23 DIAGNOSIS — R600 Localized edema: Secondary | ICD-10-CM | POA: Diagnosis not present

## 2023-09-23 DIAGNOSIS — J209 Acute bronchitis, unspecified: Secondary | ICD-10-CM | POA: Diagnosis not present

## 2023-09-23 DIAGNOSIS — R0902 Hypoxemia: Secondary | ICD-10-CM | POA: Diagnosis not present

## 2023-09-23 DIAGNOSIS — I5020 Unspecified systolic (congestive) heart failure: Secondary | ICD-10-CM | POA: Diagnosis not present

## 2023-09-24 DIAGNOSIS — J209 Acute bronchitis, unspecified: Secondary | ICD-10-CM | POA: Diagnosis not present

## 2023-09-24 DIAGNOSIS — R040 Epistaxis: Secondary | ICD-10-CM | POA: Diagnosis not present

## 2023-09-26 ENCOUNTER — Ambulatory Visit: Attending: Cardiology | Admitting: Cardiology

## 2023-09-26 ENCOUNTER — Encounter: Payer: Self-pay | Admitting: Cardiology

## 2023-09-26 VITALS — BP 134/72 | HR 69 | Ht 72.0 in | Wt 225.2 lb

## 2023-09-26 DIAGNOSIS — I5022 Chronic systolic (congestive) heart failure: Secondary | ICD-10-CM

## 2023-09-26 DIAGNOSIS — I10 Essential (primary) hypertension: Secondary | ICD-10-CM

## 2023-09-26 DIAGNOSIS — E785 Hyperlipidemia, unspecified: Secondary | ICD-10-CM

## 2023-09-26 MED ORDER — LOSARTAN POTASSIUM 25 MG PO TABS: 25.0000 mg | ORAL_TABLET | Freq: Every day | ORAL | 3 refills | Status: AC

## 2023-09-26 NOTE — Patient Instructions (Signed)
 Medication Instructions:   START: Losartan  25mg  1 tablet daily   Lab Work: Your physician recommends that you return for lab work in: Tuesday You need to have labs done when you are fasting.  You can come Monday through Friday 8:30 am to 12:00 pm and 1:15 to 4:30. You do not need to make an appointment as the order has already been placed. The labs you are going to have done are BMET    Testing/Procedures: None Ordered   Follow-Up: At [facility], you and your health needs are our priority.  As part of our continuing mission to provide you with exceptional heart care, we have created designated Provider Care Teams.  These Care Teams include your primary Cardiologist (physician) and Advanced Practice Providers (APPs -  Physician Assistants and Nurse Practitioners) who all work together to provide you with the care you need, when you need it.  We recommend signing up for the patient portal called "MyChart".  Sign up information is provided on this After Visit Summary.  MyChart is used to connect with patients for Virtual Visits (Telemedicine).  Patients are able to view lab/test results, encounter notes, upcoming appointments, etc.  Non-urgent messages can be sent to your provider as well.   To learn more about what you can do with MyChart, go to [email or web address].    Your next appointment:   3 month(s)  The format for your next appointment:   In Person  Provider:   Ralene Burger, MD    Other Instructions NA

## 2023-09-26 NOTE — Progress Notes (Signed)
 Cardiology Office Note:    Date:  09/26/2023   ID:  Benjamin Woods, DOB Oct 17, 1940, MRN [id number]  PCP:  Alonso Jan., MD  Cardiologist:  Ralene Burger, MD    Referring MD: Alonso Jan., MD   Chief Complaint  Patient presents with   [facility] follow    History of Present Illness:    Benjamin Woods is a 83 y.o. male past medical history significant for cardiomyopathy initially diagnosed in 2020 ejection fraction 25 to 30% after that guideline directed medical therapy has been initiated improvement to 4045% and then normalization also history of pulmonary emboli DVT essential hypertension dyslipidemia gout history of CVA GERD.  Comes today to my office after being in [facility] a week ago because of cough he was diagnosed with bronchitis Z-Pak be given doing well.  Overall he is feeling fine he does have minimal swelling of lower extremities but overall denies have any chest pain tightness squeezing pressure burning chest  Past Medical History:  Diagnosis Date   Acute combined systolic and diastolic congestive heart failure (HCC) 12/10/2018   Acute combined systolic and diastolic heart failure (HCC)    BPH (benign prostatic hyperplasia)    Cardiomyopathy (HCC)    DVT (deep venous thrombosis) (HCC) 12/10/2018   Elevated troponin 12/10/2018   GERD (gastroesophageal reflux disease)    Gout    History of pulmonary embolism 12/22/2019   HTN (hypertension)    Hypocalcemia    Hypokalemia    Pressure injury of skin 12/11/2018   Protein-calorie malnutrition, moderate (HCC) 12/10/2018   Pulmonary embolism (HCC)    Stroke (cerebrum) (HCC)    Syncope    Systolic congestive heart failure (HCC) 01/30/2019    Past Surgical History:  Procedure Laterality Date   HAND SURGERY      Current Medications: Current Meds  Medication Sig   allopurinol  (ZYLOPRIM ) 300 MG tablet Take 300 mg by mouth daily.   apixaban  (ELIQUIS ) 5 MG TABS tablet Take 5 mg by mouth 2 (two) times  daily.   atorvastatin  (LIPITOR) 20 MG tablet Take 1 tablet (20 mg total) by mouth daily.   carvedilol  (COREG ) 3.125 MG tablet Take 1 tablet (3.125 mg total) by mouth 2 (two) times daily with a meal.   cetirizine (ZYRTEC) 10 MG tablet Take 10 mg by mouth daily.   colchicine  0.6 MG tablet Take 0.6 mg by mouth daily.   DEXILANT 60 MG capsule Take 1 capsule by mouth daily.   Febuxostat  80 MG TABS Take 80 mg by mouth daily.   furosemide  (LASIX ) 20 MG tablet Take 2 tablets (40 mg total) by mouth daily.   guaiFENesin  (MUCINEX ) 600 MG 12 hr tablet Take 600 mg by mouth 2 (two) times daily.   latanoprost  (XALATAN ) 0.005 % ophthalmic solution Place 1 drop into both eyes at bedtime.   sildenafil (VIAGRA) 100 MG tablet Take 100 mg by mouth daily as needed for erectile dysfunction. One hour prior to sex   tamsulosin  (FLOMAX ) 0.4 MG CAPS capsule Take 0.4 mg by mouth 2 (two) times daily.     Allergies:   Patient has no known allergies.   Social History   Socioeconomic History   Marital status: Married    Spouse name: Not on file   Number of children: Not on file   Years of education: Not on file   Highest education level: Not on file  Occupational History   Not on file  Tobacco Use   Smoking status: Former  Smokeless tobacco: Never  Vaping Use   Vaping status: Never Used  Substance and Sexual Activity   Alcohol use: Not Currently   Drug use: Never   Sexual activity: Not Currently  Other Topics Concern   Not on file  Social History Narrative   Not on file   Social Drivers of Health   Financial Resource Strain: Low Risk  (08/24/2022)   Overall Financial Resource Strain (CARDIA)    Difficulty of Paying Living Expenses: Not hard at all  Food Insecurity: No Food Insecurity (07/11/2023)   Hunger Vital Sign    Worried About Running Out of Food in the Last Year: Never true    Ran Out of Food in the Last Year: Never true  Transportation Needs: No Transportation Needs (07/11/2023)   PRAPARE -  Administrator, Civil Service (Medical): No    Lack of Transportation (Non-Medical): No  Physical Activity: Insufficiently Active (08/24/2022)   Exercise Vital Sign    Days of Exercise per Week: 3 days    Minutes of Exercise per Session: 10 min  Stress: Not on file  Social Connections: Moderately Isolated (07/11/2023)   Social Connection and Isolation Panel [NHANES]    Frequency of Communication with Friends and Family: Once a week    Frequency of Social Gatherings with Friends and Family: Once a week    Attends Religious Services: 1 to 4 times per year    Active Member of [employer] or Organizations: No    Attends Banker Meetings: Never    Marital Status: Married     Family History: The patient's family history is not on file. ROS:   Please see the history of present illness.    All 14 point review of systems negative except as described per history of present illness  EKGs/Labs/Other Studies Reviewed:         Recent Labs: 07/10/2023: B Natriuretic Peptide 426.6 07/11/2023: TSH 4.335 07/13/2023: Hemoglobin 12.1; Platelets 104 07/16/2023: Magnesium 2.1 07/26/2023: ALT 34; BUN 29; Creatinine, Ser 1.63; Potassium 4.1; Sodium 138  Recent Lipid Panel No results found for: "CHOL", "TRIG", "HDL", "CHOLHDL", "VLDL", "LDLCALC", "LDLDIRECT"  Physical Exam:    VS:  BP 134/72 (BP Location: Right Arm, Patient Position: Sitting)   Pulse 69   Ht 6' (1.829 m)   Wt 225 lb 3.2 oz (102.2 kg)   SpO2 94%   BMI 30.54 kg/m     Wt Readings from Last 3 Encounters:  09/26/23 225 lb 3.2 oz (102.2 kg)  07/26/23 232 lb 3.2 oz (105.3 kg)  07/16/23 228 lb 9.9 oz (103.7 kg)     GEN:  Well nourished, well developed in no acute distress HEENT: Normal NECK: No JVD; No carotid bruits LYMPHATICS: No lymphadenopathy CARDIAC: RRR, no murmurs, no rubs, no gallops RESPIRATORY:  Clear to auscultation without rales, wheezing or rhonchi  ABDOMEN: Soft, non-tender,  non-distended MUSCULOSKELETAL:  No edema; No deformity  SKIN: Warm and dry LOWER EXTREMITIES: no swelling NEUROLOGIC:  Alert and oriented x 3 PSYCHIATRIC:  Normal affect   ASSESSMENT:    1. Chronic systolic congestive heart failure (HCC)   2. Primary hypertension   3. Dyslipidemia    PLAN:    In order of problems listed above:  Chronic systolic congestive heart failure.  Will start losartan  25 daily Chem-7 will be checked next week.  Hopefully will be able to switch him to Entresto  on the physical exam he appears to be compensated. Essential hypertension blood pressure well-controlled continue  present management. Dyslipidemia I did review K PN LDL 40 HDL 47 we will continue present management   Medication Adjustments/Labs and Tests Ordered: Current medicines are reviewed at length with the patient today.  Concerns regarding medicines are outlined above.  Orders Placed This Encounter  Procedures   Basic metabolic panel with GFR   Medication changes: No orders of the defined types were placed in this encounter.   Signed, Manfred Seed, MD, [facility] 09/26/2023 3:01 PM    [facility]

## 2023-10-02 DIAGNOSIS — I10 Essential (primary) hypertension: Secondary | ICD-10-CM | POA: Diagnosis not present

## 2023-10-03 LAB — BASIC METABOLIC PANEL WITH GFR
BUN/Creatinine Ratio: 16 (ref 10–24)
BUN: 22 mg/dL (ref 8–27)
CO2: 26 mmol/L (ref 20–29)
Calcium: 9.2 mg/dL (ref 8.6–10.2)
Chloride: 97 mmol/L (ref 96–106)
Creatinine, Ser: 1.35 mg/dL — ABNORMAL HIGH (ref 0.76–1.27)
Glucose: 152 mg/dL — ABNORMAL HIGH (ref 70–99)
Potassium: 4.3 mmol/L (ref 3.5–5.2)
Sodium: 138 mmol/L (ref 134–144)
eGFR: 52 mL/min/{1.73_m2} — ABNORMAL LOW (ref >59)

## 2023-10-04 ENCOUNTER — Ambulatory Visit: Payer: Self-pay | Admitting: Cardiology

## 2023-10-11 ENCOUNTER — Telehealth: Payer: Self-pay

## 2023-10-11 NOTE — Telephone Encounter (Signed)
 Left message on My Chart with lab results per Dr. Vanetta Shawl note. Routed to PCP.

## 2023-10-15 DIAGNOSIS — R040 Epistaxis: Secondary | ICD-10-CM | POA: Diagnosis not present

## 2023-10-15 DIAGNOSIS — D696 Thrombocytopenia, unspecified: Secondary | ICD-10-CM | POA: Diagnosis not present

## 2023-10-15 DIAGNOSIS — I739 Peripheral vascular disease, unspecified: Secondary | ICD-10-CM | POA: Diagnosis not present

## 2023-10-15 DIAGNOSIS — J40 Bronchitis, not specified as acute or chronic: Secondary | ICD-10-CM | POA: Diagnosis not present

## 2023-10-18 ENCOUNTER — Telehealth: Payer: Self-pay

## 2023-10-18 NOTE — Telephone Encounter (Signed)
 Pt viewed lab results on My Chart per Dr. Vanetta Shawl note. Routed to PCP.

## 2023-11-13 DIAGNOSIS — I10 Essential (primary) hypertension: Secondary | ICD-10-CM | POA: Diagnosis not present

## 2023-11-13 DIAGNOSIS — I5020 Unspecified systolic (congestive) heart failure: Secondary | ICD-10-CM | POA: Diagnosis not present

## 2023-11-13 DIAGNOSIS — D696 Thrombocytopenia, unspecified: Secondary | ICD-10-CM | POA: Diagnosis not present

## 2023-11-13 DIAGNOSIS — Z139 Encounter for screening, unspecified: Secondary | ICD-10-CM | POA: Diagnosis not present

## 2023-11-13 DIAGNOSIS — I739 Peripheral vascular disease, unspecified: Secondary | ICD-10-CM | POA: Diagnosis not present

## 2023-11-13 DIAGNOSIS — M109 Gout, unspecified: Secondary | ICD-10-CM | POA: Diagnosis not present

## 2023-11-13 DIAGNOSIS — N183 Chronic kidney disease, stage 3 unspecified: Secondary | ICD-10-CM | POA: Diagnosis not present

## 2023-11-13 DIAGNOSIS — E785 Hyperlipidemia, unspecified: Secondary | ICD-10-CM | POA: Diagnosis not present

## 2023-12-27 ENCOUNTER — Encounter: Payer: Self-pay | Admitting: Cardiology

## 2023-12-27 ENCOUNTER — Ambulatory Visit: Attending: Cardiology | Admitting: Cardiology

## 2023-12-27 VITALS — BP 100/60 | HR 72 | Ht 72.0 in | Wt 221.0 lb

## 2023-12-27 DIAGNOSIS — I5022 Chronic systolic (congestive) heart failure: Secondary | ICD-10-CM

## 2023-12-27 DIAGNOSIS — E785 Hyperlipidemia, unspecified: Secondary | ICD-10-CM | POA: Diagnosis not present

## 2023-12-27 DIAGNOSIS — I10 Essential (primary) hypertension: Secondary | ICD-10-CM | POA: Diagnosis not present

## 2023-12-27 DIAGNOSIS — Z86711 Personal history of pulmonary embolism: Secondary | ICD-10-CM

## 2023-12-27 DIAGNOSIS — R0609 Other forms of dyspnea: Secondary | ICD-10-CM | POA: Diagnosis not present

## 2023-12-27 NOTE — Progress Notes (Signed)
 Cardiology Office Note:    Date:  12/27/2023   ID:  Benjamin Woods, DOB Nov 05, 1940, MRN [id number]  PCP:  Elaine Garnette BIRCH., MD  Cardiologist:  Lamar Fitch, MD    Referring MD: Elaine Garnette BIRCH., MD   Chief Complaint  Patient presents with   Follow-up    History of Present Illness:    Benjamin Woods is a 83 y.o. male past medical history significant for cardiomyopathy initially diagnosed in 2020 ejection fraction 25 to 30% he was put on guideline directed medical therapy which led to improvement left ventricular ejection fraction with last echocardiogram showing ejection fraction 45 to 50%.  Also history of pulmonary emboli and DVT, anticoagulated, history of CVA, GERD, gout, dyslipidemia.  Comes today to my office with his daughter.  Doing fine denies have any chest pain tightness squeezing pressure burning chest no shortness of breath.  No swelling of lower extremities  Past Medical History:  Diagnosis Date   Acute combined systolic and diastolic congestive heart failure (HCC) 12/10/2018   Acute combined systolic and diastolic heart failure (HCC)    BPH (benign prostatic hyperplasia)    Cardiomyopathy (HCC)    DVT (deep venous thrombosis) (HCC) 12/10/2018   Elevated troponin 12/10/2018   GERD (gastroesophageal reflux disease)    Gout    History of pulmonary embolism 12/22/2019   HTN (hypertension)    Hypocalcemia    Hypokalemia    Pressure injury of skin 12/11/2018   Protein-calorie malnutrition, moderate (HCC) 12/10/2018   Pulmonary embolism (HCC)    Stroke (cerebrum) (HCC)    Syncope    Systolic congestive heart failure (HCC) 01/30/2019    Past Surgical History:  Procedure Laterality Date   HAND SURGERY      Current Medications: Current Meds  Medication Sig   allopurinol  (ZYLOPRIM ) 300 MG tablet Take 300 mg by mouth daily.   apixaban  (ELIQUIS ) 5 MG TABS tablet Take 5 mg by mouth 2 (two) times daily.   atorvastatin  (LIPITOR) 20 MG tablet Take 1 tablet (20  mg total) by mouth daily.   carvedilol  (COREG ) 3.125 MG tablet Take 1 tablet (3.125 mg total) by mouth 2 (two) times daily with a meal.   DEXILANT 60 MG capsule Take 1 capsule by mouth daily.   Febuxostat  80 MG TABS Take 80 mg by mouth daily.   furosemide  (LASIX ) 20 MG tablet Take 2 tablets (40 mg total) by mouth daily.   guaiFENesin  (MUCINEX ) 600 MG 12 hr tablet Take 600 mg by mouth 2 (two) times daily.   latanoprost  (XALATAN ) 0.005 % ophthalmic solution Place 1 drop into both eyes at bedtime.   losartan  (COZAAR ) 25 MG tablet Take 1 tablet (25 mg total) by mouth daily.   sildenafil (VIAGRA) 100 MG tablet Take 100 mg by mouth daily as needed for erectile dysfunction. One hour prior to sex   tamsulosin  (FLOMAX ) 0.4 MG CAPS capsule Take 0.4 mg by mouth 2 (two) times daily.     Allergies:   Patient has no known allergies.   Social History   Socioeconomic History   Marital status: Married    Spouse name: Not on file   Number of children: Not on file   Years of education: Not on file   Highest education level: Not on file  Occupational History   Not on file  Tobacco Use   Smoking status: Former   Smokeless tobacco: Never  Vaping Use   Vaping status: Never Used  Substance and Sexual Activity  Alcohol use: Not Currently   Drug use: Never   Sexual activity: Not Currently  Other Topics Concern   Not on file  Social History Narrative   Not on file   Social Drivers of Health   Financial Resource Strain: Low Risk  (08/24/2022)   Overall Financial Resource Strain (CARDIA)    Difficulty of Paying Living Expenses: Not hard at all  Food Insecurity: No Food Insecurity (07/11/2023)   Hunger Vital Sign    Worried About Running Out of Food in the Last Year: Never true    Ran Out of Food in the Last Year: Never true  Transportation Needs: No Transportation Needs (07/11/2023)   PRAPARE - Administrator, Civil Service (Medical): No    Lack of Transportation (Non-Medical): No   Physical Activity: Insufficiently Active (08/24/2022)   Exercise Vital Sign    Days of Exercise per Week: 3 days    Minutes of Exercise per Session: 10 min  Stress: Not on file  Social Connections: Moderately Isolated (07/11/2023)   Social Connection and Isolation Panel    Frequency of Communication with Friends and Family: Once a week    Frequency of Social Gatherings with Friends and Family: Once a week    Attends Religious Services: 1 to 4 times per year    Active Member of [employer] or Organizations: No    Attends Banker Meetings: Never    Marital Status: Married     Family History: The patient's family history is not on file. ROS:   Please see the history of present illness.    All 14 point review of systems negative except as described per history of present illness  EKGs/Labs/Other Studies Reviewed:         Recent Labs: 07/10/2023: B Natriuretic Peptide 426.6 07/11/2023: TSH 4.335 07/13/2023: Hemoglobin 12.1; Platelets 104 07/16/2023: Magnesium 2.1 07/26/2023: ALT 34 10/02/2023: BUN 22; Creatinine, Ser 1.35; Potassium 4.3; Sodium 138  Recent Lipid Panel No results found for: CHOL, TRIG, HDL, CHOLHDL, VLDL, LDLCALC, LDLDIRECT  Physical Exam:    VS:  BP 100/60   Pulse 72   Ht 6' (1.829 m)   Wt 221 lb (100.2 kg)   SpO2 94%   BMI 29.97 kg/m     Wt Readings from Last 3 Encounters:  12/27/23 221 lb (100.2 kg)  09/26/23 225 lb 3.2 oz (102.2 kg)  07/26/23 232 lb 3.2 oz (105.3 kg)     GEN:  Well nourished, well developed in no acute distress HEENT: Normal NECK: No JVD; No carotid bruits LYMPHATICS: No lymphadenopathy CARDIAC: RRR, no murmurs, no rubs, no gallops RESPIRATORY:  Clear to auscultation without rales, wheezing or rhonchi  ABDOMEN: Soft, non-tender, non-distended MUSCULOSKELETAL:  No edema; No deformity  SKIN: Warm and dry LOWER EXTREMITIES: no swelling NEUROLOGIC:  Alert and oriented x 3 PSYCHIATRIC:  Normal affect   ASSESSMENT:     1. Primary hypertension   2. History of pulmonary embolism   3. Dyslipidemia   4. Chronic systolic congestive heart failure (HCC)    PLAN:    In order of problems listed above:  History of cardiomyopathy improved with normalization but now with a drop in ejection fraction again.  I will check Chem-7 if Chem-7 is fine we will try to double the dose of losartan  may be changed to Entresto . History of pulmonary emboli/DVT, will continue with anticoagulation.  Dose is appropriate to his kidney function and age and weight. Chronic systolic congestive heart failure appears to  be compensated continue present management. Dyslipidemia taking Lipitor 20 which I continue I did review K PN which show me LDL 45 HDL 32 good control   Medication Adjustments/Labs and Tests Ordered: Current medicines are reviewed at length with the patient today.  Concerns regarding medicines are outlined above.  No orders of the defined types were placed in this encounter.  Medication changes: No orders of the defined types were placed in this encounter.   Signed, Lamar DOROTHA Fitch, MD, [facility] 12/27/2023 2:27 PM    [facility]

## 2023-12-27 NOTE — Patient Instructions (Addendum)
Medication Instructions:  Your physician recommends that you continue on your current medications as directed. Please refer to the Current Medication list given to you today.  *If you need a refill on your cardiac medications before your next appointment, please call your pharmacy*   Lab Work: BMP, ProBNP- today If you have labs (blood work) drawn today and your tests are completely normal, you will receive your results only by: MyChart Message (if you have MyChart) OR A paper copy in the mail If you have any lab test that is abnormal or we need to change your treatment, we will call you to review the results.   Testing/Procedures: None Ordered   Follow-Up: At [facility], you and your health needs are our priority.  As part of our continuing mission to provide you with exceptional heart care, we have created designated Provider Care Teams.  These Care Teams include your primary Cardiologist (physician) and Advanced Practice Providers (APPs -  Physician Assistants and Nurse Practitioners) who all work together to provide you with the care you need, when you need it.  We recommend signing up for the patient portal called "MyChart".  Sign up information is provided on this After Visit Summary.  MyChart is used to connect with patients for Virtual Visits (Telemedicine).  Patients are able to view lab/test results, encounter notes, upcoming appointments, etc.  Non-urgent messages can be sent to your provider as well.   To learn more about what you can do with MyChart, go to [email or web address].    Your next appointment:   4 month(s)  The format for your next appointment:   In Person  Provider:   Robert Krasowski, MD    Other Instructions NA  

## 2023-12-28 LAB — BASIC METABOLIC PANEL WITH GFR
BUN/Creatinine Ratio: 17 (ref 10–24)
BUN: 26 mg/dL (ref 8–27)
CO2: 26 mmol/L (ref 20–29)
Calcium: 8.9 mg/dL (ref 8.6–10.2)
Chloride: 99 mmol/L (ref 96–106)
Creatinine, Ser: 1.55 mg/dL — ABNORMAL HIGH (ref 0.76–1.27)
Glucose: 146 mg/dL — ABNORMAL HIGH (ref 70–99)
Potassium: 4 mmol/L (ref 3.5–5.2)
Sodium: 137 mmol/L (ref 134–144)
eGFR: 44 mL/min/1.73 — ABNORMAL LOW (ref >59)

## 2023-12-28 LAB — PRO B NATRIURETIC PEPTIDE: NT-Pro BNP: 201 pg/mL (ref 0–486)

## 2024-01-02 ENCOUNTER — Other Ambulatory Visit: Payer: Self-pay | Admitting: Cardiology

## 2024-01-03 ENCOUNTER — Ambulatory Visit: Payer: Self-pay | Admitting: Cardiology

## 2024-01-08 ENCOUNTER — Telehealth: Payer: Self-pay

## 2024-01-08 NOTE — Telephone Encounter (Signed)
 Left message on My Chart with normal results per Dr. Karry note. Routed to PCP.

## 2024-01-13 DIAGNOSIS — R0989 Other specified symptoms and signs involving the circulatory and respiratory systems: Secondary | ICD-10-CM | POA: Diagnosis not present

## 2024-01-13 DIAGNOSIS — R0602 Shortness of breath: Secondary | ICD-10-CM | POA: Diagnosis not present

## 2024-01-13 DIAGNOSIS — R9431 Abnormal electrocardiogram [ECG] [EKG]: Secondary | ICD-10-CM | POA: Diagnosis not present

## 2024-01-13 DIAGNOSIS — J439 Emphysema, unspecified: Secondary | ICD-10-CM | POA: Diagnosis not present

## 2024-01-13 DIAGNOSIS — I509 Heart failure, unspecified: Secondary | ICD-10-CM | POA: Diagnosis not present

## 2024-01-13 DIAGNOSIS — N189 Chronic kidney disease, unspecified: Secondary | ICD-10-CM | POA: Diagnosis not present

## 2024-01-13 DIAGNOSIS — Z87891 Personal history of nicotine dependence: Secondary | ICD-10-CM | POA: Diagnosis not present

## 2024-01-13 DIAGNOSIS — I10 Essential (primary) hypertension: Secondary | ICD-10-CM | POA: Diagnosis not present

## 2024-01-13 DIAGNOSIS — I130 Hypertensive heart and chronic kidney disease with heart failure and stage 1 through stage 4 chronic kidney disease, or unspecified chronic kidney disease: Secondary | ICD-10-CM | POA: Diagnosis not present

## 2024-01-13 DIAGNOSIS — I5040 Unspecified combined systolic (congestive) and diastolic (congestive) heart failure: Secondary | ICD-10-CM | POA: Diagnosis not present

## 2024-02-03 DIAGNOSIS — R040 Epistaxis: Secondary | ICD-10-CM | POA: Diagnosis not present

## 2024-02-03 DIAGNOSIS — R6889 Other general symptoms and signs: Secondary | ICD-10-CM | POA: Diagnosis not present

## 2024-02-03 DIAGNOSIS — I10 Essential (primary) hypertension: Secondary | ICD-10-CM | POA: Diagnosis not present

## 2024-02-03 DIAGNOSIS — Z743 Need for continuous supervision: Secondary | ICD-10-CM | POA: Diagnosis not present

## 2024-02-06 DIAGNOSIS — R040 Epistaxis: Secondary | ICD-10-CM | POA: Diagnosis not present

## 2024-02-06 DIAGNOSIS — D696 Thrombocytopenia, unspecified: Secondary | ICD-10-CM | POA: Diagnosis not present

## 2024-03-02 DIAGNOSIS — Z7901 Long term (current) use of anticoagulants: Secondary | ICD-10-CM | POA: Diagnosis not present

## 2024-03-02 DIAGNOSIS — J342 Deviated nasal septum: Secondary | ICD-10-CM | POA: Diagnosis not present

## 2024-04-22 DIAGNOSIS — I429 Cardiomyopathy, unspecified: Secondary | ICD-10-CM | POA: Insufficient documentation

## 2024-04-22 DIAGNOSIS — I5041 Acute combined systolic (congestive) and diastolic (congestive) heart failure: Secondary | ICD-10-CM | POA: Insufficient documentation

## 2024-04-27 ENCOUNTER — Encounter: Payer: Self-pay | Admitting: Cardiology

## 2024-04-27 ENCOUNTER — Ambulatory Visit: Attending: Cardiology | Admitting: Cardiology

## 2024-04-27 VITALS — BP 100/60 | HR 70 | Ht 72.0 in | Wt 222.0 lb

## 2024-04-27 DIAGNOSIS — I5022 Chronic systolic (congestive) heart failure: Secondary | ICD-10-CM

## 2024-04-27 DIAGNOSIS — I77810 Thoracic aortic ectasia: Secondary | ICD-10-CM

## 2024-04-27 DIAGNOSIS — E785 Hyperlipidemia, unspecified: Secondary | ICD-10-CM | POA: Diagnosis not present

## 2024-04-27 DIAGNOSIS — I420 Dilated cardiomyopathy: Secondary | ICD-10-CM

## 2024-04-27 NOTE — Patient Instructions (Signed)
 Medication Instructions:  Your physician recommends that you continue on your current medications as directed. Please refer to the Current Medication list given to you today.  *If you need a refill on your cardiac medications before your next appointment, please call your pharmacy*   Lab Work: None ordered If you have labs (blood work) drawn today and your tests are completely normal, you will receive your results only by: MyChart Message (if you have MyChart) OR A paper copy in the mail If you have any lab test that is abnormal or we need to change your treatment, we will call you to review the results.  Testing/Procedures: Your physician has requested that you have an echocardiogram. Echocardiography is a painless test that uses sound waves to create images of your heart. It provides your doctor with information about the size and shape of your heart and how well your heart's chambers and valves are working. This procedure takes approximately one hour. There are no restrictions for this procedure. Please do NOT wear cologne, perfume, aftershave, or lotions (deodorant is allowed). Please arrive 15 minutes prior to your appointment time.  Please note: We ask at that you not bring children with you during ultrasound (echo/ vascular) testing. Due to room size and safety concerns, children are not allowed in the ultrasound rooms during exams. Our front office staff cannot provide observation of children in our lobby area while testing is being conducted. An adult accompanying a patient to their appointment will only be allowed in the ultrasound room at the discretion of the ultrasound technician under special circumstances. We apologize for any inconvenience.  Follow-Up: At [facility], you and your health needs are our priority.  As part of our continuing mission to provide you with exceptional heart care, we have created designated Provider Care Teams.  These Care Teams include your primary  Cardiologist (physician) and Advanced Practice Providers (APPs -  Physician Assistants and Nurse Practitioners) who all work together to provide you with the care you need, when you need it.  We recommend signing up for the patient portal called MyChart.  Sign up information is provided on this After Visit Summary.  MyChart is used to connect with patients for Virtual Visits (Telemedicine).  Patients are able to view lab/test results, encounter notes, upcoming appointments, etc.  Non-urgent messages can be sent to your provider as well.   To learn more about what you can do with MyChart, go to [email or web address].    Your next appointment:   6 month(s)  The format for your next appointment:   In Person  Provider:   Lamar Fitch, MD   Other Instructions Echocardiogram An echocardiogram is a test that uses sound waves (ultrasound) to produce images of the heart. Images from an echocardiogram can provide important information about: Heart size and shape. The size and thickness and movement of your heart's walls. Heart muscle function and strength. Heart valve function or if you have stenosis. Stenosis is when the heart valves are too narrow. If blood is flowing backward through the heart valves (regurgitation). A tumor or infectious growth around the heart valves. Areas of heart muscle that are not working well because of poor blood flow or injury from a heart attack. Aneurysm detection. An aneurysm is a weak or damaged part of an artery wall. The wall bulges out from the normal force of blood pumping through the body. Tell a health care provider about: Any allergies you have. All medicines you are taking, including vitamins, herbs,  eye drops, creams, and over-the-counter medicines. Any blood disorders you have. Any surgeries you have had. Any medical conditions you have. Whether you are pregnant or may be pregnant. What are the risks? Generally, this is a safe test.  However, problems may occur, including an allergic reaction to dye (contrast) that may be used during the test. What happens before the test? No specific preparation is needed. You may eat and drink normally. What happens during the test? You will take off your clothes from the waist up and put on a hospital gown. Electrodes or electrocardiogram (ECG)patches may be placed on your chest. The electrodes or patches are then connected to a device that monitors your heart rate and rhythm. You will lie down on a table for an ultrasound exam. A gel will be applied to your chest to help sound waves pass through your skin. A handheld device, called a transducer, will be pressed against your chest and moved over your heart. The transducer produces sound waves that travel to your heart and bounce back (or echo back) to the transducer. These sound waves will be captured in real-time and changed into images of your heart that can be viewed on a video monitor. The images will be recorded on a computer and reviewed by your health care provider. You may be asked to change positions or hold your breath for a short time. This makes it easier to get different views or better views of your heart. In some cases, you may receive contrast through an IV in one of your veins. This can improve the quality of the pictures from your heart. The procedure may vary among health care providers and hospitals.   What can I expect after the test? You may return to your normal, everyday life, including diet, activities, and medicines, unless your health care provider tells you not to do that. Follow these instructions at home: It is up to you to get the results of your test. Ask your health care provider, or the department that is doing the test, when your results will be ready. Keep all follow-up visits. This is important. Summary An echocardiogram is a test that uses sound waves (ultrasound) to produce images of the heart. Images  from an echocardiogram can provide important information about the size and shape of your heart, heart muscle function, heart valve function, and other possible heart problems. You do not need to do anything to prepare before this test. You may eat and drink normally. After the echocardiogram is completed, you may return to your normal, everyday life, unless your health care provider tells you not to do that. This information is not intended to replace advice given to you by your health care provider. Make sure you discuss any questions you have with your health care provider. Document Revised: 12/15/2019 Document Reviewed: 12/15/2019 Elsevier Patient Education  2021 Elsevier Inc[employer]   Important Information About Sugar

## 2024-04-27 NOTE — Addendum Note (Signed)
 Addended by: ONEITA BERLINER on: 04/27/2024 02:55 PM   Modules accepted: Orders

## 2024-04-27 NOTE — Progress Notes (Signed)
 " Cardiology Office Note:    Date:  04/27/2024   ID:  Benjamin Woods, DOB Oct 07, 1940, MRN [id number]  PCP:  Elaine Garnette BIRCH., MD  Cardiologist:  Lamar Fitch, MD    Referring MD: Elaine Garnette BIRCH., MD   Chief Complaint  Patient presents with   Follow-up  Doing fine  History of Present Illness:    Benjamin Woods is a 83 y.o. male past medical history significant for cardiomyopathy initially diagnosed in 2020 ejection fraction 25 to 30% then guideline directed medical therapy with improvement, however latest echocardiogram showed ejection fraction 45-50, also remote history of pulmonary emboli and DVT, anticoagulated, history of CVA, GERD, gout, dyslipidemia.  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   BPH (benign prostatic hyperplasia)    Bradycardia 07/11/2023   Cardiomyopathy (HCC)    CHF exacerbation (HCC) 07/11/2023   Cystitis 07/11/2023   DVT (deep venous thrombosis) (HCC) 12/10/2018   Dyslipidemia 12/01/2020   Elevated troponin 12/10/2018   GERD (gastroesophageal reflux disease)    Gout    History of pulmonary embolism 12/22/2019   HTN (hypertension)    Hypocalcemia    Hypokalemia    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   Social History   Socioeconomic History   Marital status: Married    Spouse name: Not on file   Number of children: Not on file   Years of education: Not on file   Highest education level: Not on file  Occupational History   Not on file  Tobacco Use   Smoking status: Former   Smokeless tobacco: Never  Vaping Use   Vaping status: Never Used  Substance and Sexual Activity   Alcohol use: Not Currently   Drug use: Never   Sexual activity: Not Currently  Other Topics Concern   Not on file  Social History Narrative   Not on file   Social Drivers of Health   Tobacco Use: Medium Risk (04/27/2024)   Patient History    Smoking Tobacco Use: Former    Smokeless Tobacco Use: Never    Passive Exposure: Not on file  Financial Resource Strain: Low Risk (08/24/2022)   Overall Financial Resource Strain (CARDIA)    Difficulty of Paying Living Expenses: Not hard at all  Food Insecurity: No Food Insecurity (07/11/2023)   Hunger Vital Sign    Worried About Running Out of Food in the Last Year: Never true    Ran Out of Food in the Last Year:  Never true  Transportation Needs: No Transportation Needs (07/11/2023)   PRAPARE - Administrator, Civil Service (Medical): No    Lack of Transportation (Non-Medical): No  Physical Activity: Insufficiently Active (08/24/2022)   Exercise Vital Sign    Days of Exercise per Week: 3 days    Minutes of Exercise per Session: 10 min  Stress: Not on file  Social Connections: Moderately Isolated (07/11/2023)   Social Connection and Isolation Panel    Frequency of Communication with Friends and Family: Once a week    Frequency of Social Gatherings with Friends and  Family: Once a week    Attends Religious Services: 1 to 4 times per year    Active Member of [employer] or Organizations: No    Attends Banker Meetings: Never    Marital Status: Married  Depression (PHQ2-9): Low Risk (03/25/2023)   Depression (PHQ2-9)    PHQ-2 Score: 0  Alcohol Screen: Low Risk (08/24/2022)   Alcohol Screen    Last Alcohol Screening Score (AUDIT): 0  Housing: Low Risk (07/11/2023)   Housing Stability Vital Sign    Unable to Pay for Housing in the Last Year: No    Number of Times Moved in the Last Year: 0    Homeless in the Last Year: No  Utilities: Not At Risk (07/11/2023)   AHC Utilities    Threatened with loss of utilities: No  Health Literacy: Adequate Health Literacy (03/25/2023)   B1300 Health Literacy    Frequency of need for help with medical instructions: Never     Family History: The patient's family history is not on file. ROS:   Please see the history of present illness.    All 14 point review of systems negative except as described per history of present illness  EKGs/Labs/Other Studies Reviewed:         Recent Labs: 07/10/2023: B Natriuretic Peptide 426.6 07/11/2023: TSH 4.335 07/13/2023: Hemoglobin 12.1; Platelets 104 07/16/2023: Magnesium 2.1 07/26/2023: ALT 34 12/27/2023: BUN 26; Creatinine, Ser 1.55; NT-Pro BNP 201; Potassium 4.0; Sodium 137  Recent Lipid Panel No results found for: CHOL, TRIG, HDL, CHOLHDL, VLDL, LDLCALC, LDLDIRECT  Physical Exam:    VS:  BP 100/60   Pulse 70   Ht 6' (1.829 m)   Wt 222 lb (100.7 kg)   SpO2 98%   BMI 30.11 kg/m     Wt Readings from Last 3 Encounters:  04/27/24 222 lb (100.7 kg)  12/27/23 221 lb (100.2 kg)  09/26/23 225 lb 3.2 oz (102.2 kg)     GEN:  Well nourished, well developed in no acute distress HEENT: Normal NECK: No JVD; No carotid bruits LYMPHATICS: No lymphadenopathy CARDIAC: RRR, no murmurs, no rubs, no gallops RESPIRATORY:  Clear to auscultation without rales,  wheezing or rhonchi  ABDOMEN: Soft, non-tender, non-distended MUSCULOSKELETAL:  No edema; No deformity  SKIN: Warm and dry LOWER EXTREMITIES: no swelling NEUROLOGIC:  Alert and oriented x 3 PSYCHIATRIC:  Normal affect   ASSESSMENT:    1. Dilated cardiomyopathy (HCC)   2. Ascending aorta dilatation   3. Chronic systolic congestive heart failure (HCC)   4. Dyslipidemia    PLAN:    In order of problems listed above:  History of cardiomyopathy with last echocardiogram showing 45-50, we tried to switch him to Entresto  pulse however for some reason did not happen.  Will continue present management I will recheck echocardiogram recheck left ventricle ejection fraction if it still diminished then I will try to  change his medications. Ascending aorta dilatation again we will do echocardiogram which will give us  idea about size of the aorta. Dyslipidemia, he is taking Lipitor 20 I did review [facility] which show me LDL 36 HDL 39 this is from 03/10/2024. Mild kidney dysfunction with creatinine 1.47.  Continue monitoring, avoid nephrotoxic medications   Medication Adjustments/Labs and Tests Ordered: Current medicines are reviewed at length with the patient today.  Concerns regarding medicines are outlined above.  No orders of the defined types were placed in this encounter.  Medication changes: No orders of the defined types were placed in this encounter.   Signed, Lamar DOROTHA Fitch, MD, [facility] 04/27/2024 2:50 PM    [facility]    [1]  Current Meds  Medication Sig   albuterol  (VENTOLIN  HFA) 108 (90 Base) MCG/ACT inhaler Inhale 2 puffs into the lungs every 6 (six) hours as needed for wheezing or shortness of breath.   allopurinol  (ZYLOPRIM ) 300 MG tablet Take 300 mg by mouth daily.   apixaban  (ELIQUIS ) 5 MG TABS tablet Take 5 mg by mouth 2 (two) times daily.   atorvastatin  (LIPITOR) 20 MG tablet TAKE 1 TABLET(20 MG) BY MOUTH DAILY   carvedilol  (COREG ) 3.125 MG tablet  Take 1 tablet (3.125 mg total) by mouth 2 (two) times daily with a meal.   DEXILANT 60 MG capsule Take 1 capsule by mouth daily.   furosemide  (LASIX ) 20 MG tablet Take 2 tablets (40 mg total) by mouth daily.   guaiFENesin  (MUCINEX ) 600 MG 12 hr tablet Take 600 mg by mouth 2 (two) times daily.   latanoprost  (XALATAN ) 0.005 % ophthalmic solution Place 1 drop into both eyes at bedtime.   losartan  (COZAAR ) 25 MG tablet Take 1 tablet (25 mg total) by mouth daily.   ofloxacin (OCUFLOX) 0.3 % ophthalmic solution Place 1 drop into the right eye 4 (four) times daily.   oxymetazoline (AFRIN) 0.05 % nasal spray Place 1 spray into both nostrils 2 (two) times daily.   prednisoLONE acetate (PRED FORTE) 1 % ophthalmic suspension Place 1 drop into the right eye 4 (four) times daily.   sildenafil (VIAGRA) 100 MG tablet Take 100 mg by mouth daily as needed for erectile dysfunction. One hour prior to sex   tamsulosin  (FLOMAX ) 0.4 MG CAPS capsule Take 0.4 mg by mouth 2 (two) times daily.   timolol (TIMOPTIC) 0.5 % ophthalmic solution Place 1 drop into both eyes 2 (two) times daily.   "

## 2024-05-26 ENCOUNTER — Ambulatory Visit: Attending: Cardiology

## 2024-05-26 DIAGNOSIS — I420 Dilated cardiomyopathy: Secondary | ICD-10-CM

## 2024-05-26 DIAGNOSIS — I5022 Chronic systolic (congestive) heart failure: Secondary | ICD-10-CM

## 2024-05-26 LAB — ECHOCARDIOGRAM COMPLETE
AR max vel: 3.69 cm2
AV Area VTI: 3.89 cm2
AV Area mean vel: 3.63 cm2
AV Mean grad: 2.5 mmHg
AV Peak grad: 5.2 mmHg
Ao pk vel: 1.14 m/s
Area-P 1/2: 2.74 cm2
S' Lateral: 3.7 cm

## 2024-05-27 ENCOUNTER — Ambulatory Visit: Payer: Self-pay | Admitting: Cardiology

## 2024-05-29 ENCOUNTER — Telehealth: Payer: Self-pay

## 2024-05-29 NOTE — Telephone Encounter (Signed)
 Pt viewed Echo results on My Chart per Dr. Vanetta Shawl note. Routed to PCP.

## 2024-05-29 NOTE — Telephone Encounter (Signed)
 Left message on My Chart with lab results per Dr. Karry note. Routed to PCP
